# Patient Record
Sex: Female | Born: 1935 | Race: White | Hispanic: No | Smoking: Former smoker
Health system: Southern US, Community
[De-identification: ages and names within clinical notes are randomized; demographics above are authoritative.]

## PROBLEM LIST (undated history)

## (undated) DIAGNOSIS — E039 Hypothyroidism, unspecified: Secondary | ICD-10-CM

## (undated) DIAGNOSIS — K5730 Diverticulosis of large intestine without perforation or abscess without bleeding: Secondary | ICD-10-CM

## (undated) DIAGNOSIS — K5792 Diverticulitis of intestine, part unspecified, without perforation or abscess without bleeding: Secondary | ICD-10-CM

## (undated) DIAGNOSIS — M797 Fibromyalgia: Secondary | ICD-10-CM

## (undated) DIAGNOSIS — M1990 Unspecified osteoarthritis, unspecified site: Secondary | ICD-10-CM

## (undated) DIAGNOSIS — K5790 Diverticulosis of intestine, part unspecified, without perforation or abscess without bleeding: Secondary | ICD-10-CM

## (undated) HISTORY — PX: ABDOMINAL ADHESION SURGERY: SHX90

## (undated) HISTORY — PX: ECTOPIC PREGNANCY SURGERY: SHX613

## (undated) HISTORY — PX: ABDOMINAL HYSTERECTOMY: SHX81

## (undated) HISTORY — PX: KNEE ARTHROPLASTY: SHX992

---

## 2005-04-02 ENCOUNTER — Emergency Department (HOSPITAL_COMMUNITY): Admission: EM | Admit: 2005-04-02 | Discharge: 2005-04-02 | Payer: Self-pay | Admitting: Emergency Medicine

## 2005-12-31 ENCOUNTER — Encounter: Admission: RE | Admit: 2005-12-31 | Discharge: 2005-12-31 | Payer: Self-pay | Admitting: Gastroenterology

## 2006-03-12 ENCOUNTER — Ambulatory Visit (HOSPITAL_COMMUNITY): Admission: RE | Admit: 2006-03-12 | Discharge: 2006-03-12 | Payer: Self-pay | Admitting: Gastroenterology

## 2006-09-07 ENCOUNTER — Ambulatory Visit: Payer: Self-pay | Admitting: Oncology

## 2006-09-21 LAB — CBC & DIFF AND RETIC
Eosinophils Absolute: 0.1 10*3/uL (ref 0.0–0.5)
MONO#: 0.7 10*3/uL (ref 0.1–0.9)
MONO%: 12.6 % (ref 0.0–13.0)
NEUT#: 3.5 10*3/uL (ref 1.5–6.5)
RBC: 3.42 10*6/uL — ABNORMAL LOW (ref 3.70–5.32)
RDW: 14.7 % — ABNORMAL HIGH (ref 11.3–14.5)
RETIC #: 49.9 10*3/uL (ref 19.7–115.1)
Retic %: 1.5 % (ref 0.4–2.3)
WBC: 5.7 10*3/uL (ref 3.9–10.0)

## 2006-09-21 LAB — IRON AND TIBC

## 2006-09-21 LAB — COMPREHENSIVE METABOLIC PANEL
ALT: 12 U/L (ref 0–35)
AST: 27 U/L (ref 0–37)
Albumin: 4.2 g/dL (ref 3.5–5.2)
BUN: 17 mg/dL (ref 6–23)
Calcium: 9 mg/dL (ref 8.4–10.5)
Chloride: 95 mEq/L — ABNORMAL LOW (ref 96–112)
Potassium: 4 mEq/L (ref 3.5–5.3)

## 2006-09-21 LAB — MORPHOLOGY

## 2006-09-21 LAB — VITAMIN B12: Vitamin B-12: 380 pg/mL (ref 211–911)

## 2006-10-20 LAB — CBC & DIFF AND RETIC
Basophils Absolute: 0.1 10*3/uL (ref 0.0–0.1)
Eosinophils Absolute: 0.1 10*3/uL (ref 0.0–0.5)
HGB: 10.6 g/dL — ABNORMAL LOW (ref 11.6–15.9)
IRF: 0.15 (ref 0.130–0.330)
MONO#: 0.7 10*3/uL (ref 0.1–0.9)
NEUT#: 4.8 10*3/uL (ref 1.5–6.5)
RDW: 22.2 % — ABNORMAL HIGH (ref 11.3–14.5)
RETIC #: 45.8 10*3/uL (ref 19.7–115.1)
WBC: 6.7 10*3/uL (ref 3.9–10.0)
lymph#: 0.9 10*3/uL (ref 0.9–3.3)

## 2006-11-18 ENCOUNTER — Ambulatory Visit: Payer: Self-pay | Admitting: Oncology

## 2006-11-23 LAB — FERRITIN: Ferritin: 150 ng/mL (ref 10–291)

## 2006-11-23 LAB — CBC & DIFF AND RETIC
Basophils Absolute: 0 10*3/uL (ref 0.0–0.1)
EOS%: 1.2 % (ref 0.0–7.0)
Eosinophils Absolute: 0.1 10*3/uL (ref 0.0–0.5)
HGB: 11.9 g/dL (ref 11.6–15.9)
IRF: 0.25 (ref 0.130–0.330)
NEUT#: 5.6 10*3/uL (ref 1.5–6.5)
RBC: 4.24 10*6/uL (ref 3.70–5.32)
RDW: 29.8 % — ABNORMAL HIGH (ref 11.3–14.5)
RETIC #: 41.6 10*3/uL (ref 19.7–115.1)
Retic %: 1 % (ref 0.4–2.3)
lymph#: 0.9 10*3/uL (ref 0.9–3.3)

## 2006-11-23 LAB — IRON AND TIBC
TIBC: 296 ug/dL (ref 250–470)
UIBC: 217 ug/dL

## 2007-04-29 ENCOUNTER — Encounter: Admission: RE | Admit: 2007-04-29 | Discharge: 2007-07-28 | Payer: Self-pay | Admitting: Neurology

## 2007-05-12 ENCOUNTER — Ambulatory Visit: Payer: Self-pay | Admitting: Oncology

## 2007-05-17 LAB — CBC WITH DIFFERENTIAL/PLATELET
BASO%: 0.6 % (ref 0.0–2.0)
Basophils Absolute: 0 10*3/uL (ref 0.0–0.1)
EOS%: 0.9 % (ref 0.0–7.0)
Eosinophils Absolute: 0.1 10*3/uL (ref 0.0–0.5)
HCT: 35.7 % (ref 34.8–46.6)
HGB: 12.6 g/dL (ref 11.6–15.9)
LYMPH%: 20.2 % (ref 14.0–48.0)
MCH: 32.6 pg (ref 26.0–34.0)
MCHC: 35.2 g/dL (ref 32.0–36.0)
MCV: 92.7 fL (ref 81.0–101.0)
MONO#: 0.5 10*3/uL (ref 0.1–0.9)
MONO%: 8.1 % (ref 0.0–13.0)
NEUT#: 4.6 10*3/uL (ref 1.5–6.5)
NEUT%: 70.2 % (ref 39.6–76.8)
Platelets: 368 10*3/uL (ref 145–400)
RBC: 3.85 10*6/uL (ref 3.70–5.32)
RDW: 13.7 % (ref 11.3–14.5)
WBC: 6.6 10*3/uL (ref 3.9–10.0)
lymph#: 1.3 10*3/uL (ref 0.9–3.3)

## 2007-05-17 LAB — COMPREHENSIVE METABOLIC PANEL
ALT: 15 U/L (ref 0–35)
AST: 24 U/L (ref 0–37)
Albumin: 4.2 g/dL (ref 3.5–5.2)
Alkaline Phosphatase: 53 U/L (ref 39–117)
BUN: 14 mg/dL (ref 6–23)
CO2: 26 mEq/L (ref 19–32)
Calcium: 9.4 mg/dL (ref 8.4–10.5)
Chloride: 97 mEq/L (ref 96–112)
Creatinine, Ser: 0.62 mg/dL (ref 0.40–1.20)
Glucose, Bld: 99 mg/dL (ref 70–99)
Potassium: 4.1 mEq/L (ref 3.5–5.3)
Sodium: 135 mEq/L (ref 135–145)
Total Bilirubin: 0.6 mg/dL (ref 0.3–1.2)
Total Protein: 6.7 g/dL (ref 6.0–8.3)

## 2007-05-17 LAB — IRON AND TIBC
%SAT: 33 % (ref 20–55)
Iron: 101 ug/dL (ref 42–145)
TIBC: 309 ug/dL (ref 250–470)
UIBC: 208 ug/dL

## 2007-05-17 LAB — FERRITIN: Ferritin: 97 ng/mL (ref 10–291)

## 2007-11-03 ENCOUNTER — Inpatient Hospital Stay (HOSPITAL_COMMUNITY): Admission: EM | Admit: 2007-11-03 | Discharge: 2007-11-04 | Payer: Self-pay | Admitting: Emergency Medicine

## 2007-11-11 ENCOUNTER — Ambulatory Visit: Payer: Self-pay | Admitting: Oncology

## 2007-11-15 LAB — CBC WITH DIFFERENTIAL/PLATELET
Basophils Absolute: 0.1 10*3/uL (ref 0.0–0.1)
EOS%: 1.4 % (ref 0.0–7.0)
HCT: 29.6 % — ABNORMAL LOW (ref 34.8–46.6)
HGB: 10.1 g/dL — ABNORMAL LOW (ref 11.6–15.9)
LYMPH%: 17 % (ref 14.0–48.0)
MCH: 31.3 pg (ref 26.0–34.0)
MCV: 92.4 fL (ref 81.0–101.0)
MONO%: 9.5 % (ref 0.0–13.0)
NEUT%: 71.1 % (ref 39.6–76.8)
Platelets: 647 10*3/uL — ABNORMAL HIGH (ref 145–400)
RDW: 15.9 % — ABNORMAL HIGH (ref 11.3–14.5)

## 2007-11-15 LAB — COMPREHENSIVE METABOLIC PANEL
AST: 24 U/L (ref 0–37)
Alkaline Phosphatase: 49 U/L (ref 39–117)
BUN: 15 mg/dL (ref 6–23)
Creatinine, Ser: 0.69 mg/dL (ref 0.40–1.20)
Glucose, Bld: 105 mg/dL — ABNORMAL HIGH (ref 70–99)
Total Bilirubin: 0.4 mg/dL (ref 0.3–1.2)

## 2007-11-15 LAB — IRON AND TIBC
%SAT: 13 % — ABNORMAL LOW (ref 20–55)
TIBC: 355 ug/dL (ref 250–470)
UIBC: 308 ug/dL

## 2008-01-27 ENCOUNTER — Ambulatory Visit: Payer: Self-pay | Admitting: Oncology

## 2008-02-01 LAB — CBC & DIFF AND RETIC
Basophils Absolute: 0 10*3/uL (ref 0.0–0.1)
EOS%: 0.8 % (ref 0.0–7.0)
HGB: 12.6 g/dL (ref 11.6–15.9)
LYMPH%: 16.8 % (ref 14.0–48.0)
MCH: 30.2 pg (ref 26.0–34.0)
MCV: 90.5 fL (ref 81.0–101.0)
MONO%: 11.8 % (ref 0.0–13.0)
Platelets: 319 10*3/uL (ref 145–400)
RBC: 4.18 10*6/uL (ref 3.70–5.32)
RDW: 15.5 % — ABNORMAL HIGH (ref 11.3–14.5)
RETIC #: 39.7 10*3/uL (ref 19.7–115.1)

## 2008-02-01 LAB — FERRITIN: Ferritin: 274 ng/mL (ref 10–291)

## 2008-03-10 ENCOUNTER — Ambulatory Visit: Payer: Self-pay | Admitting: Oncology

## 2008-03-14 LAB — CBC & DIFF AND RETIC
BASO%: 0.2 % (ref 0.0–2.0)
Basophils Absolute: 0 10e3/uL (ref 0.0–0.1)
EOS%: 0.4 % (ref 0.0–7.0)
Eosinophils Absolute: 0 10e3/uL (ref 0.0–0.5)
HCT: 36.2 % (ref 34.8–46.6)
HGB: 12.2 g/dL (ref 11.6–15.9)
IRF: 0.32 (ref 0.130–0.330)
LYMPH%: 16.8 % (ref 14.0–48.0)
MCH: 31.5 pg (ref 26.0–34.0)
MCHC: 33.8 g/dL (ref 32.0–36.0)
MCV: 93.2 fL (ref 81.0–101.0)
MONO#: 0.5 10e3/uL (ref 0.1–0.9)
MONO%: 8.3 % (ref 0.0–13.0)
NEUT#: 4.8 10e3/uL (ref 1.5–6.5)
NEUT%: 74.3 % (ref 39.6–76.8)
Platelets: 421 10e3/uL — ABNORMAL HIGH (ref 145–400)
RBC: 3.88 10e6/uL (ref 3.70–5.32)
RDW: 16.4 % — ABNORMAL HIGH (ref 11.3–14.5)
RETIC #: 89.2 10e3/uL (ref 19.7–115.1)
Retic %: 2.3 % (ref 0.4–2.3)
WBC: 6.4 10e3/uL (ref 3.9–10.0)
lymph#: 1.1 10e3/uL (ref 0.9–3.3)

## 2008-03-14 LAB — IRON AND TIBC: %SAT: 29 % (ref 20–55)

## 2008-03-14 LAB — FOLATE: Folate: >20 ng/mL

## 2008-03-14 LAB — VITAMIN B12: Vitamin B-12: 414 pg/mL (ref 211–911)

## 2008-05-05 ENCOUNTER — Ambulatory Visit: Payer: Self-pay | Admitting: Oncology

## 2008-05-09 LAB — CBC & DIFF AND RETIC
BASO%: 0.6 % (ref 0.0–2.0)
Basophils Absolute: 0 10e3/uL (ref 0.0–0.1)
EOS%: 0.6 % (ref 0.0–7.0)
Eosinophils Absolute: 0 10e3/uL (ref 0.0–0.5)
HCT: 38.7 % (ref 34.8–46.6)
HGB: 13.1 g/dL (ref 11.6–15.9)
IRF: 0.34 — ABNORMAL HIGH (ref 0.130–0.330)
LYMPH%: 14 % (ref 14.0–48.0)
MCH: 32.9 pg (ref 26.0–34.0)
MCHC: 33.9 g/dL (ref 32.0–36.0)
MCV: 97.1 fL (ref 81.0–101.0)
MONO#: 0.9 10e3/uL (ref 0.1–0.9)
MONO%: 13.6 % — ABNORMAL HIGH (ref 0.0–13.0)
NEUT#: 4.5 10e3/uL (ref 1.5–6.5)
NEUT%: 71.2 % (ref 39.6–76.8)
Platelets: 412 10e3/uL — ABNORMAL HIGH (ref 145–400)
RBC: 3.98 10e6/uL (ref 3.70–5.32)
RDW: 13.9 % (ref 11.3–14.5)
RETIC #: 44.2 10e3/uL (ref 19.7–115.1)
Retic %: 1.1 % (ref 0.4–2.3)
WBC: 6.3 10e3/uL (ref 3.9–10.0)
lymph#: 0.9 10e3/uL (ref 0.9–3.3)

## 2008-05-09 LAB — FERRITIN: Ferritin: 133 ng/mL (ref 10–291)

## 2008-05-09 LAB — IRON AND TIBC
%SAT: 20 % (ref 20–55)
Iron: 59 ug/dL (ref 42–145)
TIBC: 288 ug/dL (ref 250–470)
UIBC: 229 ug/dL

## 2008-06-25 ENCOUNTER — Inpatient Hospital Stay (HOSPITAL_COMMUNITY): Admission: EM | Admit: 2008-06-25 | Discharge: 2008-06-27 | Payer: Self-pay | Admitting: Emergency Medicine

## 2008-06-28 ENCOUNTER — Ambulatory Visit: Payer: Self-pay | Admitting: Oncology

## 2008-06-30 LAB — CBC & DIFF AND RETIC
BASO%: 0.4 % (ref 0.0–2.0)
IRF: 0.49 — ABNORMAL HIGH (ref 0.130–0.330)
MCHC: 33.8 g/dL (ref 32.0–36.0)
MONO#: 0.9 10*3/uL (ref 0.1–0.9)
RBC: 3.36 10*6/uL — ABNORMAL LOW (ref 3.70–5.32)
RDW: 16.6 % — ABNORMAL HIGH (ref 11.3–14.5)
Retic %: 4.4 % — ABNORMAL HIGH (ref 0.4–2.3)
WBC: 6.4 10*3/uL (ref 3.9–10.0)
lymph#: 1 10*3/uL (ref 0.9–3.3)

## 2008-06-30 LAB — COMPREHENSIVE METABOLIC PANEL
ALT: 20 U/L (ref 0–35)
AST: 27 U/L (ref 0–37)
BUN: 17 mg/dL (ref 6–23)
Creatinine, Ser: 0.52 mg/dL (ref 0.40–1.20)
Total Bilirubin: 0.3 mg/dL (ref 0.3–1.2)

## 2008-06-30 LAB — LACTATE DEHYDROGENASE: LDH: 201 U/L (ref 94–250)

## 2008-06-30 LAB — IRON AND TIBC
Iron: 21 ug/dL — ABNORMAL LOW (ref 42–145)
UIBC: 294 ug/dL

## 2008-06-30 LAB — FERRITIN: Ferritin: 20 ng/mL (ref 10–291)

## 2008-08-30 ENCOUNTER — Ambulatory Visit: Payer: Self-pay | Admitting: Oncology

## 2008-10-24 ENCOUNTER — Ambulatory Visit: Payer: Self-pay | Admitting: Oncology

## 2008-10-27 LAB — CBC WITH DIFFERENTIAL/PLATELET
Basophils Absolute: 0 10*3/uL (ref 0.0–0.1)
Eosinophils Absolute: 0.1 10*3/uL (ref 0.0–0.5)
HGB: 12.3 g/dL (ref 11.6–15.9)
MCV: 93.4 fL (ref 79.5–101.0)
MONO%: 9 % (ref 0.0–14.0)
NEUT#: 5 10*3/uL (ref 1.5–6.5)
RDW: 14.7 % — ABNORMAL HIGH (ref 11.2–14.5)
lymph#: 1.3 10*3/uL (ref 0.9–3.3)

## 2008-10-27 LAB — IRON AND TIBC
%SAT: 28 % (ref 20–55)
TIBC: 288 ug/dL (ref 250–470)
UIBC: 207 ug/dL

## 2008-10-27 LAB — FERRITIN: Ferritin: 204 ng/mL (ref 10–291)

## 2009-01-03 ENCOUNTER — Ambulatory Visit: Payer: Self-pay | Admitting: Oncology

## 2009-01-05 LAB — IRON AND TIBC: Iron: 97 ug/dL (ref 42–145)

## 2009-01-05 LAB — CBC WITH DIFFERENTIAL/PLATELET
BASO%: 0.8 % (ref 0.0–2.0)
LYMPH%: 12.3 % — ABNORMAL LOW (ref 14.0–49.7)
MCHC: 34.7 g/dL (ref 31.5–36.0)
MCV: 94.3 fL (ref 79.5–101.0)
MONO%: 9.3 % (ref 0.0–14.0)
Platelets: 383 10*3/uL (ref 145–400)
RBC: 3.74 10*6/uL (ref 3.70–5.45)

## 2009-01-05 LAB — FERRITIN: Ferritin: 191 ng/mL (ref 10–291)

## 2009-01-25 ENCOUNTER — Encounter (HOSPITAL_COMMUNITY): Admission: RE | Admit: 2009-01-25 | Discharge: 2009-04-25 | Payer: Self-pay | Admitting: Oncology

## 2009-01-25 ENCOUNTER — Emergency Department (HOSPITAL_COMMUNITY): Admission: EM | Admit: 2009-01-25 | Discharge: 2009-01-25 | Payer: Self-pay | Admitting: Emergency Medicine

## 2009-01-25 LAB — CBC WITH DIFFERENTIAL/PLATELET
Basophils Absolute: 0 10*3/uL (ref 0.0–0.1)
Eosinophils Absolute: 0.1 10*3/uL (ref 0.0–0.5)
HGB: 8.2 g/dL — ABNORMAL LOW (ref 11.6–15.9)
MONO#: 0.8 10*3/uL (ref 0.1–0.9)
NEUT#: 4.9 10*3/uL (ref 1.5–6.5)
RDW: 15.9 % — ABNORMAL HIGH (ref 11.2–14.5)
WBC: 7 10*3/uL (ref 3.9–10.3)
lymph#: 1.3 10*3/uL (ref 0.9–3.3)

## 2009-02-24 ENCOUNTER — Inpatient Hospital Stay (HOSPITAL_COMMUNITY): Admission: EM | Admit: 2009-02-24 | Discharge: 2009-02-25 | Payer: Self-pay | Admitting: Emergency Medicine

## 2009-02-27 ENCOUNTER — Ambulatory Visit: Payer: Self-pay | Admitting: Oncology

## 2009-02-27 LAB — CBC WITH DIFFERENTIAL/PLATELET
BASO%: 0.7 % (ref 0.0–2.0)
Eosinophils Absolute: 0.1 10*3/uL (ref 0.0–0.5)
HCT: 29.5 % — ABNORMAL LOW (ref 34.8–46.6)
LYMPH%: 15.6 % (ref 14.0–49.7)
MCHC: 32.2 g/dL (ref 31.5–36.0)
MONO#: 0.7 10*3/uL (ref 0.1–0.9)
NEUT%: 72.7 % (ref 38.4–76.8)
Platelets: 303 10*3/uL (ref 145–400)
WBC: 7.1 10*3/uL (ref 3.9–10.3)

## 2009-02-27 LAB — FERRITIN: Ferritin: 76 ng/mL (ref 10–291)

## 2009-02-27 LAB — IRON AND TIBC: %SAT: 9 % — ABNORMAL LOW (ref 20–55)

## 2009-03-21 ENCOUNTER — Inpatient Hospital Stay (HOSPITAL_COMMUNITY): Admission: EM | Admit: 2009-03-21 | Discharge: 2009-03-24 | Payer: Self-pay | Admitting: Emergency Medicine

## 2009-04-01 ENCOUNTER — Inpatient Hospital Stay (HOSPITAL_COMMUNITY): Admission: EM | Admit: 2009-04-01 | Discharge: 2009-04-03 | Payer: Self-pay | Admitting: Emergency Medicine

## 2009-04-16 ENCOUNTER — Ambulatory Visit: Payer: Self-pay | Admitting: Oncology

## 2009-04-16 LAB — CBC WITH DIFFERENTIAL/PLATELET
Basophils Absolute: 0.1 10*3/uL (ref 0.0–0.1)
EOS%: 0.9 % (ref 0.0–7.0)
MCH: 31.2 pg (ref 25.1–34.0)
MCHC: 33.4 g/dL (ref 31.5–36.0)
MCV: 93.4 fL (ref 79.5–101.0)
MONO%: 8.1 % (ref 0.0–14.0)
RBC: 3.12 10*6/uL — ABNORMAL LOW (ref 3.70–5.45)
RDW: 15.5 % — ABNORMAL HIGH (ref 11.2–14.5)

## 2009-04-16 LAB — IRON AND TIBC: UIBC: 244 ug/dL

## 2009-04-25 ENCOUNTER — Encounter (INDEPENDENT_AMBULATORY_CARE_PROVIDER_SITE_OTHER): Payer: Self-pay | Admitting: Orthopedic Surgery

## 2009-04-25 ENCOUNTER — Inpatient Hospital Stay (HOSPITAL_COMMUNITY): Admission: RE | Admit: 2009-04-25 | Discharge: 2009-04-27 | Payer: Self-pay | Admitting: Orthopedic Surgery

## 2009-06-19 ENCOUNTER — Ambulatory Visit: Payer: Self-pay | Admitting: Oncology

## 2009-06-25 LAB — CBC WITH DIFFERENTIAL/PLATELET
BASO%: 0.6 % (ref 0.0–2.0)
MCHC: 33 g/dL (ref 31.5–36.0)
MONO#: 1 10*3/uL — ABNORMAL HIGH (ref 0.1–0.9)
NEUT#: 5.3 10*3/uL (ref 1.5–6.5)
RBC: 3.74 10*6/uL (ref 3.70–5.45)
WBC: 7.6 10*3/uL (ref 3.9–10.3)
lymph#: 1.1 10*3/uL (ref 0.9–3.3)

## 2009-06-25 LAB — IRON AND TIBC
%SAT: 8 % — ABNORMAL LOW (ref 20–55)
Iron: 29 ug/dL — ABNORMAL LOW (ref 42–145)
TIBC: 372 ug/dL (ref 250–470)
UIBC: 343 ug/dL

## 2009-07-09 LAB — CBC WITH DIFFERENTIAL/PLATELET
Basophils Absolute: 0 10*3/uL (ref 0.0–0.1)
EOS%: 0.9 % (ref 0.0–7.0)
HCT: 35.8 % (ref 34.8–46.6)
HGB: 11.7 g/dL (ref 11.6–15.9)
LYMPH%: 15.1 % (ref 14.0–49.7)
MCH: 28.8 pg (ref 25.1–34.0)
MONO#: 0.7 10*3/uL (ref 0.1–0.9)
NEUT%: 74.7 % (ref 38.4–76.8)
Platelets: 502 10*3/uL — ABNORMAL HIGH (ref 145–400)
lymph#: 1.2 10*3/uL (ref 0.9–3.3)

## 2009-07-09 LAB — IRON AND TIBC
%SAT: 6 % — ABNORMAL LOW (ref 20–55)
TIBC: 395 ug/dL (ref 250–470)

## 2009-07-09 LAB — VITAMIN B12: Vitamin B-12: 380 pg/mL (ref 211–911)

## 2009-07-19 ENCOUNTER — Ambulatory Visit: Payer: Self-pay | Admitting: Oncology

## 2009-07-28 HISTORY — PX: COLONOSCOPY: SHX174

## 2009-08-17 ENCOUNTER — Ambulatory Visit: Payer: Self-pay | Admitting: Oncology

## 2009-09-10 LAB — CBC WITH DIFFERENTIAL/PLATELET
BASO%: 0.4 % (ref 0.0–2.0)
Basophils Absolute: 0 10*3/uL (ref 0.0–0.1)
EOS%: 0.8 % (ref 0.0–7.0)
Eosinophils Absolute: 0.1 10*3/uL (ref 0.0–0.5)
HCT: 31.5 % — ABNORMAL LOW (ref 34.8–46.6)
HGB: 10.4 g/dL — ABNORMAL LOW (ref 11.6–15.9)
LYMPH%: 12.5 % — ABNORMAL LOW (ref 14.0–49.7)
MCH: 30.9 pg (ref 25.1–34.0)
MCHC: 33.1 g/dL (ref 31.5–36.0)
MCV: 93.2 fL (ref 79.5–101.0)
MONO#: 0.9 10*3/uL (ref 0.1–0.9)
MONO%: 10.2 % (ref 0.0–14.0)
NEUT#: 6.7 10*3/uL — ABNORMAL HIGH (ref 1.5–6.5)
NEUT%: 76.1 % (ref 38.4–76.8)
Platelets: 539 10*3/uL — ABNORMAL HIGH (ref 145–400)
RBC: 3.38 10*6/uL — ABNORMAL LOW (ref 3.70–5.45)
RDW: 18.6 % — ABNORMAL HIGH (ref 11.2–14.5)
WBC: 8.8 10*3/uL (ref 3.9–10.3)
lymph#: 1.1 10*3/uL (ref 0.9–3.3)

## 2009-09-10 LAB — FERRITIN: Ferritin: 102 ng/mL (ref 10–291)

## 2009-09-25 ENCOUNTER — Inpatient Hospital Stay (HOSPITAL_COMMUNITY): Admission: EM | Admit: 2009-09-25 | Discharge: 2009-09-28 | Payer: Self-pay | Admitting: Emergency Medicine

## 2009-11-06 ENCOUNTER — Ambulatory Visit: Payer: Self-pay | Admitting: Oncology

## 2009-11-08 LAB — CBC WITH DIFFERENTIAL/PLATELET
BASO%: 0.3 % (ref 0.0–2.0)
Basophils Absolute: 0 10*3/uL (ref 0.0–0.1)
EOS%: 0.5 % (ref 0.0–7.0)
Eosinophils Absolute: 0 10*3/uL (ref 0.0–0.5)
HCT: 33.9 % — ABNORMAL LOW (ref 34.8–46.6)
HGB: 11 g/dL — ABNORMAL LOW (ref 11.6–15.9)
LYMPH%: 8.9 % — ABNORMAL LOW (ref 14.0–49.7)
MCH: 29 pg (ref 25.1–34.0)
MCHC: 32.4 g/dL (ref 31.5–36.0)
MCV: 89.3 fL (ref 79.5–101.0)
MONO#: 0.9 10*3/uL (ref 0.1–0.9)
MONO%: 8.6 % (ref 0.0–14.0)
NEUT#: 8.5 10*3/uL — ABNORMAL HIGH (ref 1.5–6.5)
NEUT%: 81.7 % — ABNORMAL HIGH (ref 38.4–76.8)
Platelets: 643 10*3/uL — ABNORMAL HIGH (ref 145–400)
RBC: 3.8 10*6/uL (ref 3.70–5.45)
RDW: 14 % (ref 11.2–14.5)
WBC: 10.4 10*3/uL — ABNORMAL HIGH (ref 3.9–10.3)
lymph#: 0.9 10*3/uL (ref 0.9–3.3)

## 2009-11-08 LAB — FERRITIN: Ferritin: 16 ng/mL (ref 10–291)

## 2009-12-19 ENCOUNTER — Emergency Department (HOSPITAL_COMMUNITY): Admission: EM | Admit: 2009-12-19 | Discharge: 2009-12-19 | Payer: Self-pay | Admitting: Emergency Medicine

## 2010-01-04 ENCOUNTER — Ambulatory Visit: Payer: Self-pay | Admitting: Oncology

## 2010-01-09 LAB — CBC WITH DIFFERENTIAL/PLATELET
BASO%: 0.6 % (ref 0.0–2.0)
Basophils Absolute: 0.1 10*3/uL (ref 0.0–0.1)
EOS%: 1.3 % (ref 0.0–7.0)
Eosinophils Absolute: 0.1 10*3/uL (ref 0.0–0.5)
HCT: 23.7 % — ABNORMAL LOW (ref 34.8–46.6)
HGB: 7.3 g/dL — ABNORMAL LOW (ref 11.6–15.9)
LYMPH%: 13.4 % — ABNORMAL LOW (ref 14.0–49.7)
MCH: 25.3 pg (ref 25.1–34.0)
MCHC: 30.8 g/dL — ABNORMAL LOW (ref 31.5–36.0)
MCV: 82.3 fL (ref 79.5–101.0)
MONO#: 1 10*3/uL — ABNORMAL HIGH (ref 0.1–0.9)
MONO%: 11.6 % (ref 0.0–14.0)
NEUT#: 6.1 10*3/uL (ref 1.5–6.5)
NEUT%: 73.1 % (ref 38.4–76.8)
Platelets: 820 10*3/uL — ABNORMAL HIGH (ref 145–400)
RBC: 2.88 10*6/uL — ABNORMAL LOW (ref 3.70–5.45)
RDW: 16 % — ABNORMAL HIGH (ref 11.2–14.5)
WBC: 8.3 10*3/uL (ref 3.9–10.3)
lymph#: 1.1 10*3/uL (ref 0.9–3.3)

## 2010-01-09 LAB — FERRITIN: Ferritin: 4 ng/mL — ABNORMAL LOW (ref 10–291)

## 2010-01-11 ENCOUNTER — Encounter (HOSPITAL_COMMUNITY): Admission: RE | Admit: 2010-01-11 | Discharge: 2010-01-11 | Payer: Self-pay | Admitting: Oncology

## 2010-01-11 LAB — TYPE & CROSSMATCH - CHCC

## 2010-03-06 ENCOUNTER — Ambulatory Visit: Payer: Self-pay | Admitting: Oncology

## 2010-03-08 LAB — CBC WITH DIFFERENTIAL/PLATELET
BASO%: 0.5 % (ref 0.0–2.0)
Basophils Absolute: 0 10*3/uL (ref 0.0–0.1)
EOS%: 1.5 % (ref 0.0–7.0)
Eosinophils Absolute: 0.1 10*3/uL (ref 0.0–0.5)
HCT: 38.4 % (ref 34.8–46.6)
HGB: 12.8 g/dL (ref 11.6–15.9)
LYMPH%: 15 % (ref 14.0–49.7)
MCH: 29.6 pg (ref 25.1–34.0)
MCHC: 33.2 g/dL (ref 31.5–36.0)
MCV: 89.1 fL (ref 79.5–101.0)
MONO#: 0.8 10*3/uL (ref 0.1–0.9)
MONO%: 12.1 % (ref 0.0–14.0)
NEUT#: 4.7 10*3/uL (ref 1.5–6.5)
NEUT%: 70.9 % (ref 38.4–76.8)
Platelets: 427 10*3/uL — ABNORMAL HIGH (ref 145–400)
RBC: 4.31 10*6/uL (ref 3.70–5.45)
RDW: 24 % — ABNORMAL HIGH (ref 11.2–14.5)
WBC: 6.7 10*3/uL (ref 3.9–10.3)
lymph#: 1 10*3/uL (ref 0.9–3.3)

## 2010-03-08 LAB — FERRITIN: Ferritin: 289 ng/mL (ref 10–291)

## 2010-05-08 ENCOUNTER — Ambulatory Visit: Payer: Self-pay | Admitting: Oncology

## 2010-05-10 LAB — CBC WITH DIFFERENTIAL/PLATELET
BASO%: 0.9 % (ref 0.0–2.0)
Basophils Absolute: 0.1 10*3/uL (ref 0.0–0.1)
EOS%: 0.6 % (ref 0.0–7.0)
Eosinophils Absolute: 0 10*3/uL (ref 0.0–0.5)
HCT: 37.3 % (ref 34.8–46.6)
HGB: 12.7 g/dL (ref 11.6–15.9)
LYMPH%: 10.4 % — ABNORMAL LOW (ref 14.0–49.7)
MCH: 32.1 pg (ref 25.1–34.0)
MCHC: 34.1 g/dL (ref 31.5–36.0)
MCV: 94.1 fL (ref 79.5–101.0)
MONO#: 0.7 10*3/uL (ref 0.1–0.9)
MONO%: 9.4 % (ref 0.0–14.0)
NEUT#: 6.2 10*3/uL (ref 1.5–6.5)
NEUT%: 78.7 % — ABNORMAL HIGH (ref 38.4–76.8)
Platelets: 357 10*3/uL (ref 145–400)
RBC: 3.96 10*6/uL (ref 3.70–5.45)
RDW: 13.6 % (ref 11.2–14.5)
WBC: 7.9 10*3/uL (ref 3.9–10.3)
lymph#: 0.8 10*3/uL — ABNORMAL LOW (ref 0.9–3.3)

## 2010-05-10 LAB — FERRITIN: Ferritin: 243 ng/mL (ref 10–291)

## 2010-06-02 IMAGING — CT CT PELVIS W/ CM
2 of 6 series · 17 of 46 positions shown, 19 images · IV contrast (agent unspecified)
Comparison: CT abdomen 12/31/2005.

CT ABDOMEN

CLINICAL DATA: Abdominal pain, GI bleeding.  Elevated white blood
cell count.

CT ABDOMEN AND PELVIS WITH CONTRAST
TECHNIQUE: Multidetector CT imaging of the abdomen and pelvis was
performed using the standard protocol following bolus
administration of intravenous contrast.
Contrast: 100 ml Fmnipaque-CGG.

[Series 2: rtn a/p with · axial · 0.64mm/px · z∈[+898,+1228]mm · 14 of 76 slices shown, 16 images]
[im 5/76  soft-tissue]
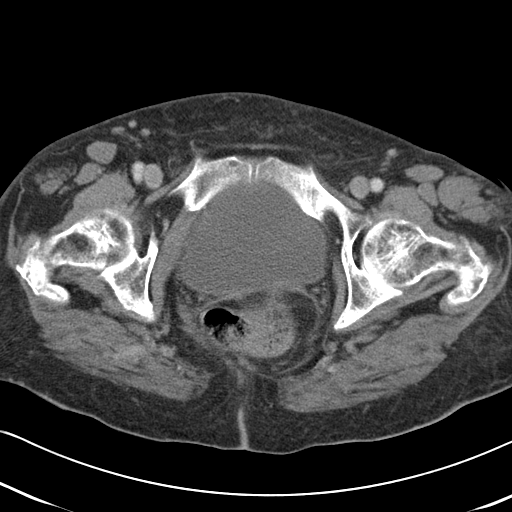
[im 5/76  bone]
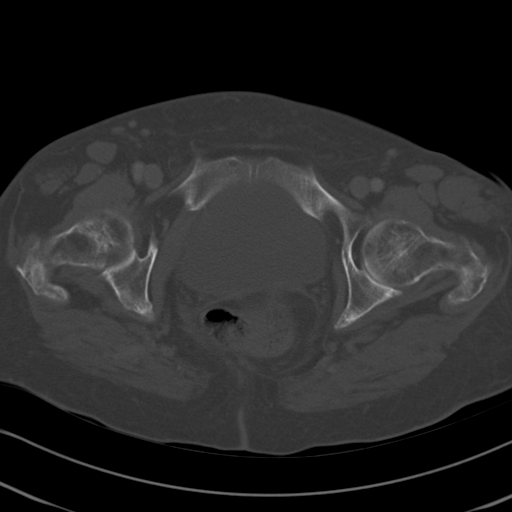
[im 10/76  soft-tissue]
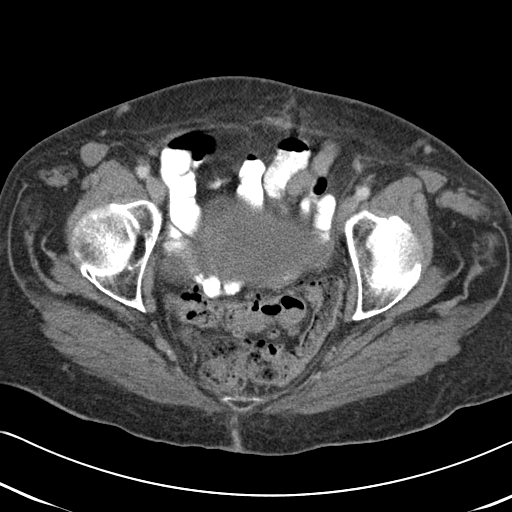
[im 15/76  soft-tissue]
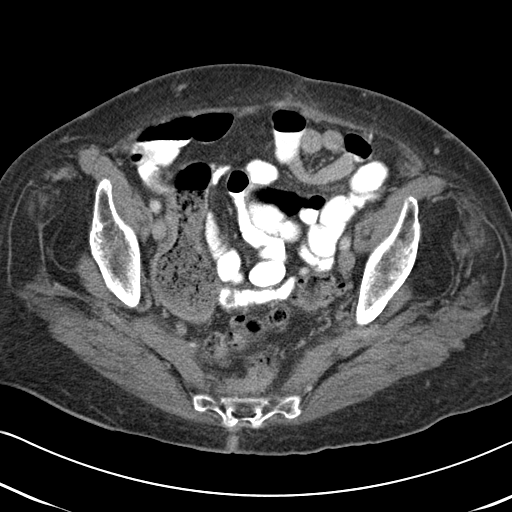
[im 19/76  soft-tissue]
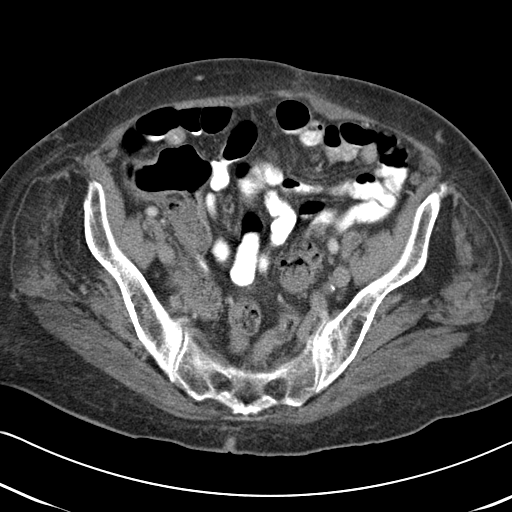
[im 24/76  soft-tissue]
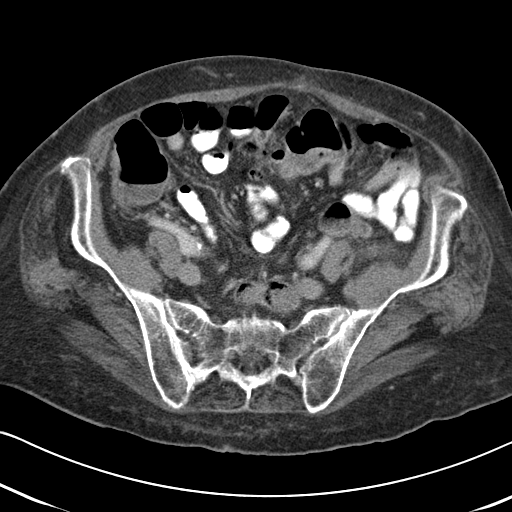
[im 29/76  soft-tissue]
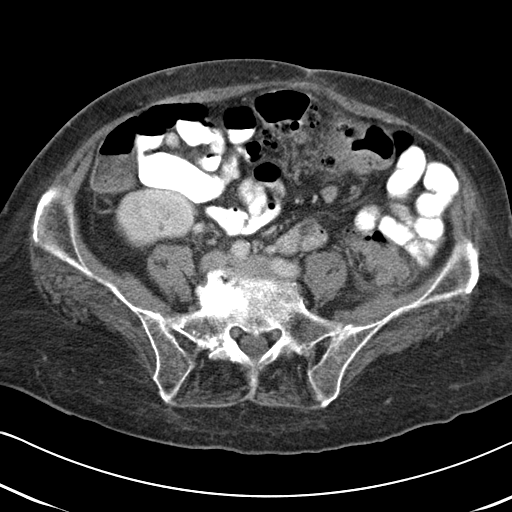
[im 33/76  soft-tissue]
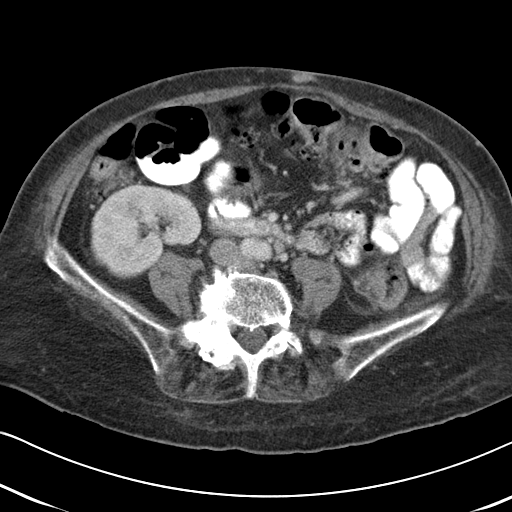
[im 43/76  soft-tissue]
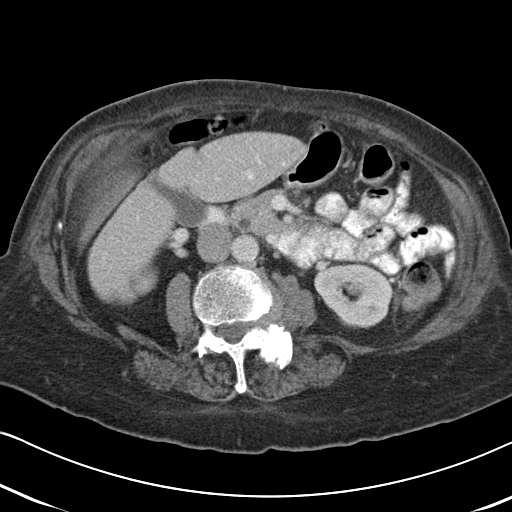
[im 47/76  soft-tissue]
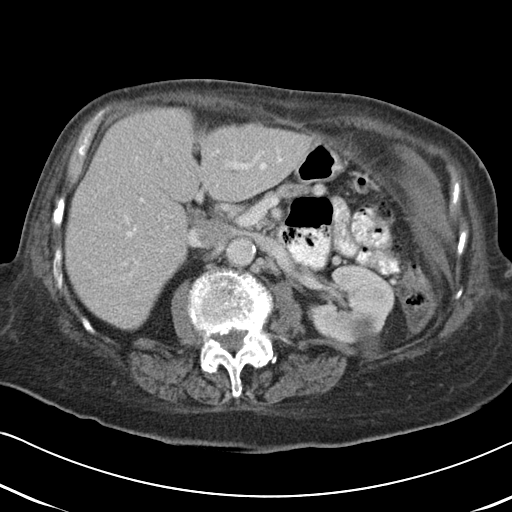
[im 47/76  bone]
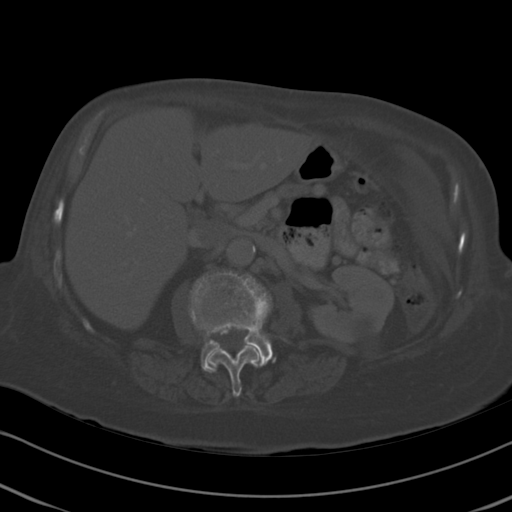
[im 52/76  soft-tissue]
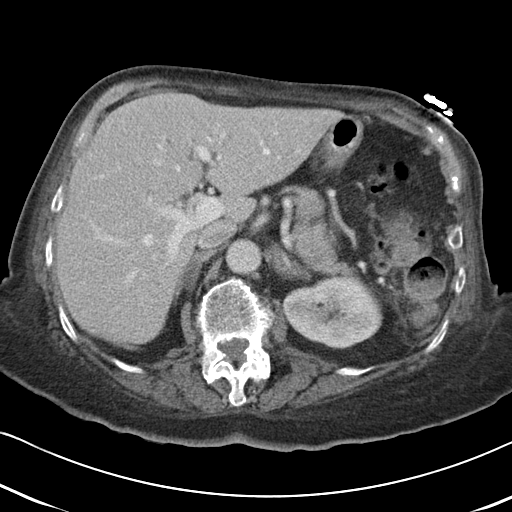
[im 57/76  soft-tissue]
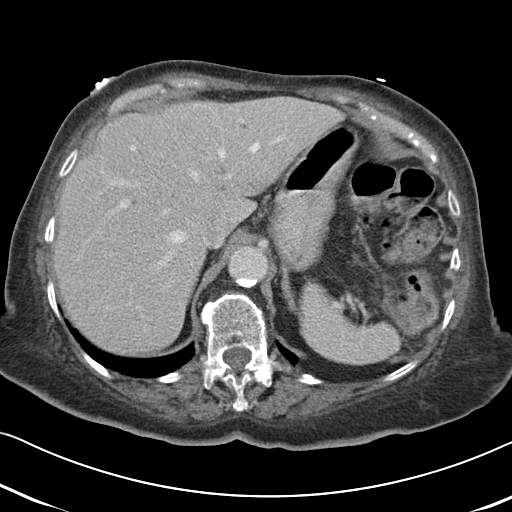
[im 61/76  soft-tissue]
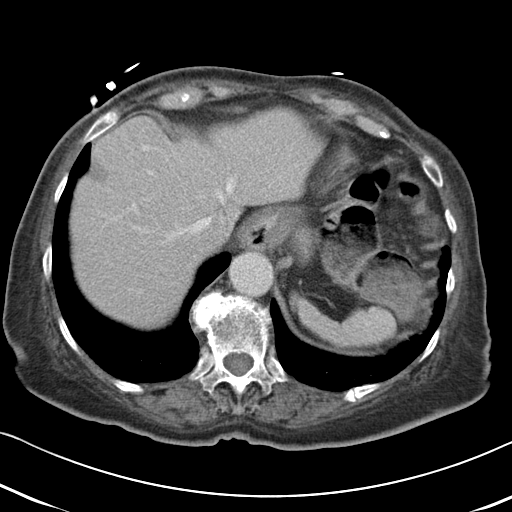
[im 66/76  soft-tissue]
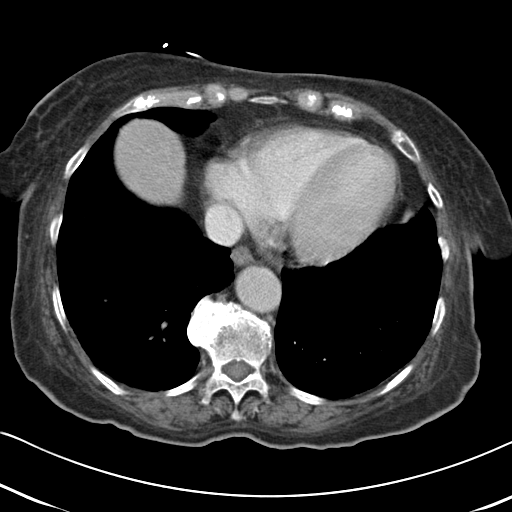
[im 71/76  soft-tissue]
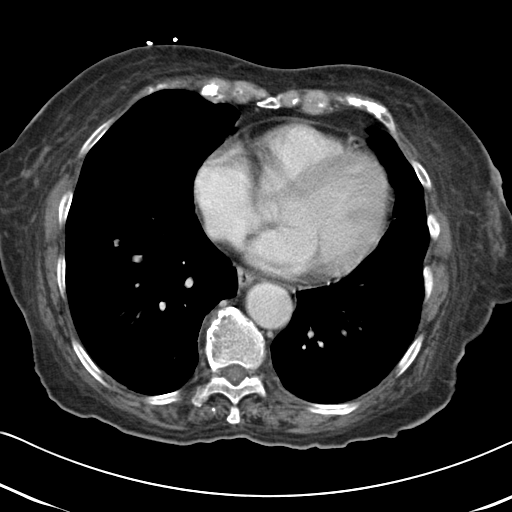

[Series 602: <mpr thick range> · coronal · 0.74mm/px · 3 of 74 slices shown]
[im 25/74  soft-tissue]
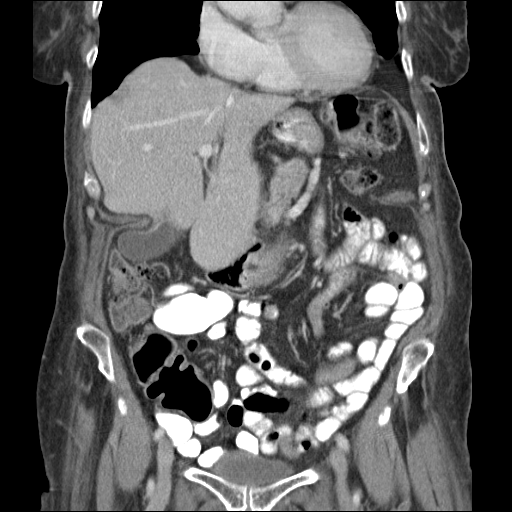
[im 33/74  soft-tissue]
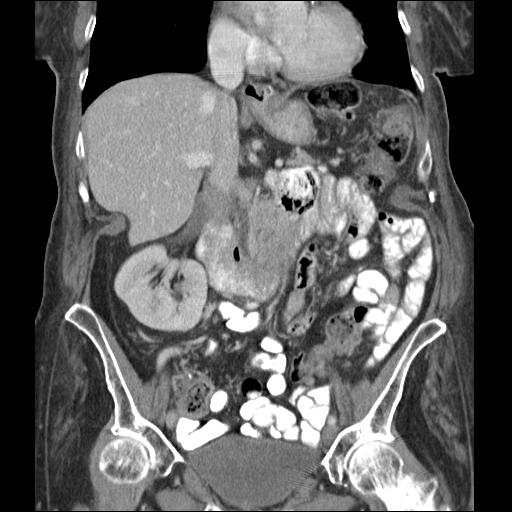
[im 41/74  soft-tissue]
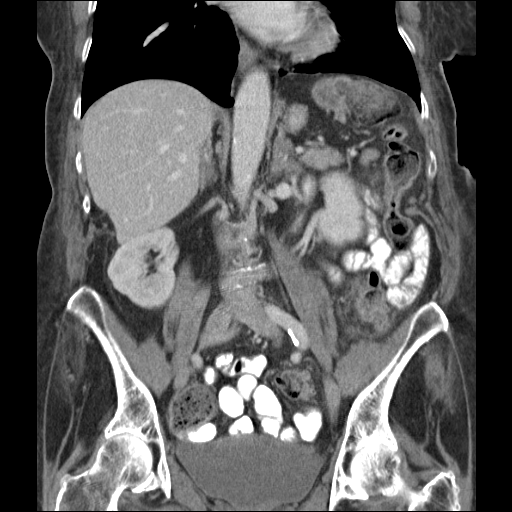

[17 of 46 positions shown; findings below may reference images not displayed]

FINDINGS: There is some mild dependent atelectatic change.  The
lung bases are otherwise clear.  The patient has a small hiatal
hernia.  There is no pleural or pericardial effusion.

The gallbladder, liver, biliary tree, spleen and pancreas are
unremarkable.  Small, low-attenuation bilateral adrenal nodules are
unchanged and consistent with benign adenomas.  Left renal cyst is
noted.  Also noted is a very small right renal cyst.  The kidneys
are otherwise unremarkable.  No abdominal lymphadenopathy or fluid.
Small bowel is unremarkable.  The patient has multilevel lumbar
spondylosis.  Remote left eleventh and twelfth rib fractures noted.
IMPRESSION: 1.  No acute finding.
2.  Small hiatal hernia.
3.  Bilateral adrenal adenomas, unchanged.
4.  Bilateral renal cysts.

CT PELVIS
FINDINGS: Note is made that a loop of colon extends into the left
ischiorectal fossa compatible with presence of rectocele.  The
patient has diverticular disease of the colon. Stranding about the
colon at the junction of the sigmoid and descending colon is
compatible with diverticulitis.  There is no abscess or
perforation.  The colon is otherwise unremarkable.  The appendix is
not visualized but no evidence inflammatory change in the right
lower quadrant is seen.  Postoperative change of hysterectomy
noted.  No focal bony abnormality.
IMPRESSION: 1. Diverticulosis with diverticulitis at the junction of the
descending and sigmoid colon.  No abscess or perforation.
2.  Rectocele.

## 2010-07-05 ENCOUNTER — Ambulatory Visit: Payer: Self-pay | Admitting: Oncology

## 2010-08-02 ENCOUNTER — Inpatient Hospital Stay (HOSPITAL_COMMUNITY)
Admission: EM | Admit: 2010-08-02 | Discharge: 2010-08-07 | Payer: Self-pay | Source: Home / Self Care | Attending: Internal Medicine | Admitting: Internal Medicine

## 2010-08-12 LAB — BASIC METABOLIC PANEL
BUN: 19 mg/dL (ref 6–23)
BUN: 10 mg/dL (ref 6–23)
BUN: 12 mg/dL (ref 6–23)
BUN: 14 mg/dL (ref 6–23)
BUN: 10 mg/dL (ref 6–23)
CO2: 26 mEq/L (ref 19–32)
CO2: 28 mEq/L (ref 19–32)
CO2: 25 mEq/L (ref 19–32)
CO2: 27 mEq/L (ref 19–32)
CO2: 29 mEq/L (ref 19–32)
Calcium: 8.3 mg/dL — ABNORMAL LOW (ref 8.4–10.5)
Calcium: 8.3 mg/dL — ABNORMAL LOW (ref 8.4–10.5)
Calcium: 8.1 mg/dL — ABNORMAL LOW (ref 8.4–10.5)
Calcium: 8.3 mg/dL — ABNORMAL LOW (ref 8.4–10.5)
Calcium: 8.7 mg/dL (ref 8.4–10.5)
Chloride: 104 mEq/L (ref 96–112)
Chloride: 105 mEq/L (ref 96–112)
Chloride: 105 mEq/L (ref 96–112)
Chloride: 105 mEq/L (ref 96–112)
Chloride: 101 mEq/L (ref 96–112)
Creatinine, Ser: 0.57 mg/dL (ref 0.4–1.2)
Creatinine, Ser: 0.51 mg/dL (ref 0.4–1.2)
Creatinine, Ser: 0.56 mg/dL (ref 0.4–1.2)
Creatinine, Ser: 0.47 mg/dL (ref 0.4–1.2)
Creatinine, Ser: 0.49 mg/dL (ref 0.4–1.2)
GFR calc Af Amer: >60 mL/min (ref >60)
GFR calc Af Amer: >60 mL/min (ref >60)
GFR calc Af Amer: >60 mL/min (ref >60)
GFR calc Af Amer: >60 mL/min (ref >60)
GFR calc Af Amer: >60 mL/min (ref >60)
GFR calc non Af Amer: >60 mL/min (ref >60)
GFR calc non Af Amer: >60 mL/min (ref >60)
GFR calc non Af Amer: >60 mL/min (ref >60)
GFR calc non Af Amer: >60 mL/min (ref >60)
GFR calc non Af Amer: >60 mL/min (ref >60)
Glucose, Bld: 101 mg/dL — ABNORMAL HIGH (ref 70–99)
Glucose, Bld: 95 mg/dL (ref 70–99)
Glucose, Bld: 89 mg/dL (ref 70–99)
Glucose, Bld: 95 mg/dL (ref 70–99)
Glucose, Bld: 95 mg/dL (ref 70–99)
Potassium: 3.4 mEq/L — ABNORMAL LOW (ref 3.5–5.1)
Potassium: 3.3 mEq/L — ABNORMAL LOW (ref 3.5–5.1)
Potassium: 3.5 mEq/L (ref 3.5–5.1)
Potassium: 3.5 mEq/L (ref 3.5–5.1)
Potassium: 3.4 mEq/L — ABNORMAL LOW (ref 3.5–5.1)
Sodium: 136 mEq/L (ref 135–145)
Sodium: 139 mEq/L (ref 135–145)
Sodium: 135 mEq/L (ref 135–145)
Sodium: 138 mEq/L (ref 135–145)
Sodium: 138 mEq/L (ref 135–145)

## 2010-08-12 LAB — TSH: TSH: 1.748 u[IU]/mL (ref 0.350–4.500)

## 2010-08-12 LAB — APTT
aPTT: 32 seconds (ref 24–37)
aPTT: 32 seconds (ref 24–37)

## 2010-08-12 LAB — CROSSMATCH
ABO/RH(D): AB POS
Antibody Screen: NEGATIVE
Unit division: 0
Unit division: 0
Unit division: 0
Unit division: 0
Unit division: 0
Unit division: 0

## 2010-08-12 LAB — URINE CULTURE
Colony Count: >=100000
Culture  Setup Time: 201201070432

## 2010-08-12 LAB — LIPASE, BLOOD: Lipase: 26 U/L (ref 11–59)

## 2010-08-12 LAB — HEMOGLOBIN AND HEMATOCRIT, BLOOD
HCT: 24.7 % — ABNORMAL LOW (ref 36.0–46.0)
HCT: 29.8 % — ABNORMAL LOW (ref 36.0–46.0)
HCT: 27.6 % — ABNORMAL LOW (ref 36.0–46.0)
HCT: 21.5 % — ABNORMAL LOW (ref 36.0–46.0)
HCT: 33.8 % — ABNORMAL LOW (ref 36.0–46.0)
HCT: 31.1 % — ABNORMAL LOW (ref 36.0–46.0)
HCT: 28 % — ABNORMAL LOW (ref 36.0–46.0)
HCT: 33.4 % — ABNORMAL LOW (ref 36.0–46.0)
HCT: 32.1 % — ABNORMAL LOW (ref 36.0–46.0)
Hemoglobin: 8.2 g/dL — ABNORMAL LOW (ref 12.0–15.0)
Hemoglobin: 10 g/dL — ABNORMAL LOW (ref 12.0–15.0)
Hemoglobin: 9.2 g/dL — ABNORMAL LOW (ref 12.0–15.0)
Hemoglobin: 7.1 g/dL — ABNORMAL LOW (ref 12.0–15.0)
Hemoglobin: 11.3 g/dL — ABNORMAL LOW (ref 12.0–15.0)
Hemoglobin: 10.4 g/dL — ABNORMAL LOW (ref 12.0–15.0)
Hemoglobin: 9.2 g/dL — ABNORMAL LOW (ref 12.0–15.0)
Hemoglobin: 10.9 g/dL — ABNORMAL LOW (ref 12.0–15.0)
Hemoglobin: 10.5 g/dL — ABNORMAL LOW (ref 12.0–15.0)

## 2010-08-12 LAB — COMPREHENSIVE METABOLIC PANEL
ALT: 10 U/L (ref 0–35)
AST: 22 U/L (ref 0–37)
Albumin: 3 g/dL — ABNORMAL LOW (ref 3.5–5.2)
Alkaline Phosphatase: 53 U/L (ref 39–117)
BUN: 23 mg/dL (ref 6–23)
CO2: 26 mEq/L (ref 19–32)
Calcium: 8.9 mg/dL (ref 8.4–10.5)
Chloride: 100 mEq/L (ref 96–112)
Creatinine, Ser: 0.71 mg/dL (ref 0.4–1.2)
GFR calc Af Amer: >60 mL/min (ref >60)
GFR calc non Af Amer: >60 mL/min (ref >60)
Glucose, Bld: 132 mg/dL — ABNORMAL HIGH (ref 70–99)
Potassium: 3.6 mEq/L (ref 3.5–5.1)
Sodium: 136 mEq/L (ref 135–145)
Total Bilirubin: 0.7 mg/dL (ref 0.3–1.2)
Total Protein: 6 g/dL (ref 6.0–8.3)

## 2010-08-12 LAB — URINALYSIS, ROUTINE W REFLEX MICROSCOPIC
Ketones, ur: 15 mg/dL — AB
Leukocytes, UA: NEGATIVE
Nitrite: NEGATIVE
Protein, ur: NEGATIVE mg/dL
Specific Gravity, Urine: 1.022 (ref 1.005–1.030)
Urine Glucose, Fasting: NEGATIVE mg/dL
Urobilinogen, UA: 0.2 mg/dL (ref 0.0–1.0)
pH: 6 (ref 5.0–8.0)

## 2010-08-12 LAB — PROTIME-INR
INR: 1.1 (ref 0.00–1.49)
INR: 1.15 (ref 0.00–1.49)
Prothrombin Time: 14.4 seconds (ref 11.6–15.2)
Prothrombin Time: 14.9 seconds (ref 11.6–15.2)

## 2010-08-12 LAB — CBC
HCT: 28.4 % — ABNORMAL LOW (ref 36.0–46.0)
HCT: 20.2 % — ABNORMAL LOW (ref 36.0–46.0)
HCT: 27.6 % — ABNORMAL LOW (ref 36.0–46.0)
HCT: 28 % — ABNORMAL LOW (ref 36.0–46.0)
Hemoglobin: 9 g/dL — ABNORMAL LOW (ref 12.0–15.0)
Hemoglobin: 6.5 g/dL — CL (ref 12.0–15.0)
Hemoglobin: 9.4 g/dL — ABNORMAL LOW (ref 12.0–15.0)
Hemoglobin: 9.2 g/dL — ABNORMAL LOW (ref 12.0–15.0)
MCH: 31.6 pg (ref 26.0–34.0)
MCH: 31.7 pg (ref 26.0–34.0)
MCH: 31.2 pg (ref 26.0–34.0)
MCH: 30.3 pg (ref 26.0–34.0)
MCHC: 31.7 g/dL (ref 30.0–36.0)
MCHC: 32.2 g/dL (ref 30.0–36.0)
MCHC: 34.1 g/dL (ref 30.0–36.0)
MCHC: 32.9 g/dL (ref 30.0–36.0)
MCV: 99.6 fL (ref 78.0–100.0)
MCV: 98.5 fL (ref 78.0–100.0)
MCV: 91.7 fL (ref 78.0–100.0)
MCV: 92.1 fL (ref 78.0–100.0)
Platelets: 501 10*3/uL — ABNORMAL HIGH (ref 150–400)
Platelets: 363 10*3/uL (ref 150–400)
Platelets: 297 10*3/uL (ref 150–400)
Platelets: 299 10*3/uL (ref 150–400)
RBC: 2.85 MIL/uL — ABNORMAL LOW (ref 3.87–5.11)
RBC: 2.05 MIL/uL — ABNORMAL LOW (ref 3.87–5.11)
RBC: 3.01 MIL/uL — ABNORMAL LOW (ref 3.87–5.11)
RBC: 3.04 MIL/uL — ABNORMAL LOW (ref 3.87–5.11)
RDW: 14.1 % (ref 11.5–15.5)
RDW: 14.1 % (ref 11.5–15.5)
RDW: 17.1 % — ABNORMAL HIGH (ref 11.5–15.5)
RDW: 17.1 % — ABNORMAL HIGH (ref 11.5–15.5)
WBC: 10.2 10*3/uL (ref 4.0–10.5)
WBC: 6.9 10*3/uL (ref 4.0–10.5)
WBC: 6.6 10*3/uL (ref 4.0–10.5)
WBC: 8.8 10*3/uL (ref 4.0–10.5)

## 2010-08-12 LAB — DIFFERENTIAL
Basophils Absolute: 0 10*3/uL (ref 0.0–0.1)
Basophils Relative: 0 % (ref 0–1)
Eosinophils Absolute: 0.1 10*3/uL (ref 0.0–0.7)
Eosinophils Relative: 1 % (ref 0–5)
Lymphocytes Relative: 8 % — ABNORMAL LOW (ref 12–46)
Lymphs Abs: 0.8 10*3/uL (ref 0.7–4.0)
Monocytes Absolute: 1 10*3/uL (ref 0.1–1.0)
Monocytes Relative: 9 % (ref 3–12)
Neutro Abs: 8.4 10*3/uL — ABNORMAL HIGH (ref 1.7–7.7)
Neutrophils Relative %: 82 % — ABNORMAL HIGH (ref 43–77)

## 2010-08-12 LAB — OCCULT BLOOD, POC DEVICE: Fecal Occult Bld: POSITIVE

## 2010-08-12 LAB — FERRITIN: Ferritin: 77 ng/mL (ref 10–291)

## 2010-08-12 LAB — PREPARE RBC (CROSSMATCH)

## 2010-08-12 LAB — IRON AND TIBC
Iron: 116 ug/dL (ref 42–135)
Saturation Ratios: 42 % (ref 20–55)
TIBC: 275 ug/dL (ref 250–470)
UIBC: 159 ug/dL

## 2010-08-12 LAB — URINE MICROSCOPIC-ADD ON

## 2010-08-12 LAB — MRSA PCR SCREENING: MRSA by PCR: NEGATIVE

## 2010-08-12 LAB — MAGNESIUM: Magnesium: 1.8 mg/dL (ref 1.5–2.5)

## 2010-08-12 LAB — VITAMIN B12: Vitamin B-12: 595 pg/mL (ref 211–911)

## 2010-08-12 LAB — FOLATE: Folate: 9.8 ng/mL

## 2010-08-18 ENCOUNTER — Encounter: Payer: Self-pay | Admitting: General Surgery

## 2010-08-21 ENCOUNTER — Ambulatory Visit: Payer: Self-pay | Admitting: Oncology

## 2010-08-22 NOTE — Discharge Summary (Addendum)
[name], [name]               ACCOUNT NO.:  [id number]  MEDICAL RECORD NO.:  [id number]          PATIENT TYPE:  INP  LOCATION:  1404                         FACILITY:  [facility]  PHYSICIAN:  Richarda Overlie, MD       DATE OF BIRTH:  July 26, 1926  DATE OF ADMISSION:  08/02/2010 DATE OF DISCHARGE:  08/07/2010                              DISCHARGE SUMMARY   PRIMARY CARE PHYSICIAN:  C. Duane Lope, M.D.  PRIMARY GI PHYSICIAN:  [facility].  DISCHARGE DIAGNOSES: 1. Lower gastrointestinal bleeding, likely diverticula status post     transfusion of 5 units of blood. 2. Hypothyroidism. 3. Urinary incontinence. 4. Osteoarthritis.  DISCHARGE MEDICATIONS: 1. Ferrous sulfate 325 mg p.o. twice daily. 2. Protonix 40 mg p.o. daily. 3. K-Dur 10 mEq p.o. three times a day. 4. Bengay 1 application of topical twice daily. 5. Norco 1 tablet p.o. q.4-6h p.r.n. 6. Magnesium with vitamin D 1 tablet p.o. every other day. 7. MiraLAX daily p.r.n. 8. Oxybutynin patch transdermal every 3 days. 9. Synthroid 112 mcg p.o. in the morning. 10.Tramadol 50 mg 1-2 tablets p.o. twice daily.  PROCEDURES: 1. Nuclear medicine scan shows positive lower GI bleeding,     distribution in the mid-to-distal transverse colon. 2. CT scan of the abdomen and pelvis in 2010 showed diverticulosis     with diverticulitis.  SUBJECTIVE:  This is an 77 year old female who presents to the ED with a chief complaint of rectal bleeding.  The patient has noticed black stools and thought that was due to her iron.  She reportedly passed bright red blood before her presentation to the ED on the January 6. She also started developing weakness and shortness of breath.  She denied any chest pain.  Initial hemoglobin was found to be 9.0.  MCV was 99.6.  Her initial blood pressure and evaluation was found to be 87 systolic.  The patient was admitted for further evaluation.  HOSPITAL COURSE: 1. Lower GI bleeding.  This is likely  diverticular.  The patient was     evaluated by Dr. Dulce Sellar during this admission.  She has received 5     units of packed red blood cells.  The patient is currently     hemodynamically stable.  She has not passed any melena or bright     red blood for the last 36-40 hours and is requesting to go home.     The patient was evaluated by Dr. Dulce Sellar who recommended surgical     consultation.  However, the patient refuses to undergo surgery at     this time.  She has received 20 units of packed red blood cell     transfusions between 04/09 and 01/12. The patient is requesting to     go home, and the last hemoglobin was 10.5.  The patient was     evaluated by Dr. Dulce Sellar prior to her discharge, and the patient     will follow-up with Dr. Randa Evens in about 7-10 days.  She will need     a CBC in about 1 week.  She has been started on a mechanical soft  diet that she has tolerated well, and she was ambulating prior to     discharge.  DISCHARGE INSTRUCTIONS: 1. CBC in 1 week. 2. Mechanical soft diet. 3. Activity as tolerated. 4. Follow-up with Dr. Randa Evens in 1-2 weeks and follow up with PCP in 5-     7 days.     Richarda Overlie, MD     NA/MEDQ  D:  08/07/2010  T:  08/07/2010  Job:  [id number]  Electronically Signed by Richarda Overlie MD on 08/22/2010 07:42:12 AM

## 2010-08-23 LAB — CBC WITH DIFFERENTIAL/PLATELET
BASO%: 0.1 % (ref 0.0–2.0)
Basophils Absolute: 0 10*3/uL (ref 0.0–0.1)
EOS%: 0.5 % (ref 0.0–7.0)
Eosinophils Absolute: 0 10*3/uL (ref 0.0–0.5)
HCT: 31.2 % — ABNORMAL LOW (ref 34.8–46.6)
HGB: 10.3 g/dL — ABNORMAL LOW (ref 11.6–15.9)
LYMPH%: 8.2 % — ABNORMAL LOW (ref 14.0–49.7)
MCH: 30.6 pg (ref 25.1–34.0)
MCHC: 33 g/dL (ref 31.5–36.0)
MCV: 92.8 fL (ref 79.5–101.0)
MONO#: 0.8 10*3/uL (ref 0.1–0.9)
MONO%: 8.6 % (ref 0.0–14.0)
NEUT#: 7.2 10*3/uL — ABNORMAL HIGH (ref 1.5–6.5)
NEUT%: 82.6 % — ABNORMAL HIGH (ref 38.4–76.8)
Platelets: 640 10*3/uL — ABNORMAL HIGH (ref 145–400)
RBC: 3.36 10*6/uL — ABNORMAL LOW (ref 3.70–5.45)
RDW: 15.9 % — ABNORMAL HIGH (ref 11.2–14.5)
WBC: 8.7 10*3/uL (ref 3.9–10.3)
lymph#: 0.7 10*3/uL — ABNORMAL LOW (ref 0.9–3.3)

## 2010-10-13 LAB — CROSSMATCH: Antibody Screen: NEGATIVE

## 2010-10-14 LAB — URINE CULTURE: Colony Count: >=100000

## 2010-10-14 LAB — CBC
HCT: 28.6 % — ABNORMAL LOW (ref 36.0–46.0)
Hemoglobin: 9.2 g/dL — ABNORMAL LOW (ref 12.0–15.0)
MCV: 87.5 fL (ref 78.0–100.0)
Platelets: 478 10*3/uL — ABNORMAL HIGH (ref 150–400)
WBC: 11.2 10*3/uL — ABNORMAL HIGH (ref 4.0–10.5)

## 2010-10-14 LAB — APTT: aPTT: 31 seconds (ref 24–37)

## 2010-10-14 LAB — URINALYSIS, ROUTINE W REFLEX MICROSCOPIC
Bilirubin Urine: NEGATIVE
Glucose, UA: NEGATIVE mg/dL
Nitrite: POSITIVE — AB
Protein, ur: NEGATIVE mg/dL

## 2010-10-14 LAB — COMPREHENSIVE METABOLIC PANEL
Albumin: 3.2 g/dL — ABNORMAL LOW (ref 3.5–5.2)
Alkaline Phosphatase: 59 U/L (ref 39–117)
BUN: 16 mg/dL (ref 6–23)
Chloride: 96 mEq/L (ref 96–112)
Creatinine, Ser: 0.48 mg/dL (ref 0.4–1.2)
GFR calc non Af Amer: >60 mL/min (ref >60)
Glucose, Bld: 115 mg/dL — ABNORMAL HIGH (ref 70–99)
Potassium: 3.6 mEq/L (ref 3.5–5.1)
Total Bilirubin: 0.6 mg/dL (ref 0.3–1.2)

## 2010-10-14 LAB — PROTIME-INR
INR: 1.07 (ref 0.00–1.49)
Prothrombin Time: 13.8 seconds (ref 11.6–15.2)

## 2010-10-14 LAB — LIPASE, BLOOD: Lipase: 38 U/L (ref 11–59)

## 2010-10-14 LAB — URINE MICROSCOPIC-ADD ON

## 2010-10-14 LAB — TYPE AND SCREEN

## 2010-10-14 LAB — DIFFERENTIAL
Basophils Absolute: 0.2 10*3/uL — ABNORMAL HIGH (ref 0.0–0.1)
Basophils Relative: 1 % (ref 0–1)
Lymphocytes Relative: 9 % — ABNORMAL LOW (ref 12–46)
Monocytes Absolute: 0.6 10*3/uL (ref 0.1–1.0)
Neutro Abs: 9.4 10*3/uL — ABNORMAL HIGH (ref 1.7–7.7)
Neutrophils Relative %: 84 % — ABNORMAL HIGH (ref 43–77)

## 2010-10-16 ENCOUNTER — Encounter (HOSPITAL_BASED_OUTPATIENT_CLINIC_OR_DEPARTMENT_OTHER): Payer: Medicare Other | Admitting: Oncology

## 2010-10-16 ENCOUNTER — Other Ambulatory Visit: Payer: Self-pay | Admitting: Oncology

## 2010-10-16 DIAGNOSIS — D649 Anemia, unspecified: Secondary | ICD-10-CM

## 2010-10-16 DIAGNOSIS — K909 Intestinal malabsorption, unspecified: Secondary | ICD-10-CM

## 2010-10-16 DIAGNOSIS — K5733 Diverticulitis of large intestine without perforation or abscess with bleeding: Secondary | ICD-10-CM

## 2010-10-16 LAB — CBC WITH DIFFERENTIAL/PLATELET
BASO%: 0.7 % (ref 0.0–2.0)
Eosinophils Absolute: 0.1 10*3/uL (ref 0.0–0.5)
LYMPH%: 16.7 % (ref 14.0–49.7)
MCHC: 33.4 g/dL (ref 31.5–36.0)
MONO#: 0.8 10*3/uL (ref 0.1–0.9)
MONO%: 9.1 % (ref 0.0–14.0)
NEUT#: 6.2 10*3/uL (ref 1.5–6.5)
Platelets: 473 10*3/uL — ABNORMAL HIGH (ref 145–400)
RBC: 4.21 10*6/uL (ref 3.70–5.45)
RDW: 14.9 % — ABNORMAL HIGH (ref 11.2–14.5)
WBC: 8.5 10*3/uL (ref 3.9–10.3)

## 2010-10-16 LAB — FERRITIN: Ferritin: 48 ng/mL (ref 10–291)

## 2010-10-20 LAB — DIFFERENTIAL
Lymphocytes Relative: 8 % — ABNORMAL LOW (ref 12–46)
Lymphs Abs: 1.1 10*3/uL (ref 0.7–4.0)
Monocytes Relative: 5 % (ref 3–12)
Neutrophils Relative %: 86 % — ABNORMAL HIGH (ref 43–77)

## 2010-10-20 LAB — CROSSMATCH
ABO/RH(D): AB POS
Antibody Screen: NEGATIVE

## 2010-10-20 LAB — CBC
HCT: 24.5 % — ABNORMAL LOW (ref 36.0–46.0)
HCT: 27.5 % — ABNORMAL LOW (ref 36.0–46.0)
Hemoglobin: 9.4 g/dL — ABNORMAL LOW (ref 12.0–15.0)
MCV: 91.5 fL (ref 78.0–100.0)
MCV: 92.9 fL (ref 78.0–100.0)
Platelets: 463 10*3/uL — ABNORMAL HIGH (ref 150–400)
Platelets: 485 10*3/uL — ABNORMAL HIGH (ref 150–400)
Platelets: 499 10*3/uL — ABNORMAL HIGH (ref 150–400)
RBC: 1.96 MIL/uL — ABNORMAL LOW (ref 3.87–5.11)
RDW: 15.7 % — ABNORMAL HIGH (ref 11.5–15.5)
RDW: 16 % — ABNORMAL HIGH (ref 11.5–15.5)
WBC: 13.9 10*3/uL — ABNORMAL HIGH (ref 4.0–10.5)

## 2010-10-20 LAB — BASIC METABOLIC PANEL
BUN: 11 mg/dL (ref 6–23)
BUN: 14 mg/dL (ref 6–23)
BUN: 7 mg/dL (ref 6–23)
CO2: 27 mEq/L (ref 19–32)
Chloride: 103 mEq/L (ref 96–112)
Chloride: 96 mEq/L (ref 96–112)
Creatinine, Ser: 0.52 mg/dL (ref 0.4–1.2)
GFR calc Af Amer: >60 mL/min (ref >60)
GFR calc non Af Amer: >60 mL/min (ref >60)
GFR calc non Af Amer: >60 mL/min (ref >60)
Glucose, Bld: 94 mg/dL (ref 70–99)
Potassium: 3.5 mEq/L (ref 3.5–5.1)
Potassium: 3.9 mEq/L (ref 3.5–5.1)
Potassium: 4 mEq/L (ref 3.5–5.1)

## 2010-10-20 LAB — HEMOGLOBIN AND HEMATOCRIT, BLOOD
HCT: 25.3 % — ABNORMAL LOW (ref 36.0–46.0)
Hemoglobin: 8.4 g/dL — ABNORMAL LOW (ref 12.0–15.0)
Hemoglobin: 8.4 g/dL — ABNORMAL LOW (ref 12.0–15.0)
Hemoglobin: 8.7 g/dL — ABNORMAL LOW (ref 12.0–15.0)

## 2010-10-20 LAB — PREPARE RBC (CROSSMATCH)

## 2010-10-20 LAB — PROTIME-INR
INR: 1.03 (ref 0.00–1.49)
Prothrombin Time: 13.4 seconds (ref 11.6–15.2)

## 2010-10-28 ENCOUNTER — Other Ambulatory Visit: Payer: Self-pay | Admitting: Orthopedic Surgery

## 2010-10-28 ENCOUNTER — Encounter (HOSPITAL_COMMUNITY): Payer: Medicare Other | Attending: Orthopedic Surgery

## 2010-10-28 DIAGNOSIS — Z01812 Encounter for preprocedural laboratory examination: Secondary | ICD-10-CM | POA: Insufficient documentation

## 2010-10-28 DIAGNOSIS — Z79899 Other long term (current) drug therapy: Secondary | ICD-10-CM | POA: Insufficient documentation

## 2010-10-28 DIAGNOSIS — Z01811 Encounter for preprocedural respiratory examination: Secondary | ICD-10-CM | POA: Insufficient documentation

## 2010-10-28 DIAGNOSIS — M1710 Unilateral primary osteoarthritis, unspecified knee: Secondary | ICD-10-CM | POA: Insufficient documentation

## 2010-10-28 LAB — URINALYSIS, ROUTINE W REFLEX MICROSCOPIC
Bilirubin Urine: NEGATIVE
Nitrite: NEGATIVE
Protein, ur: NEGATIVE mg/dL
Specific Gravity, Urine: 1.025 (ref 1.005–1.030)
Urobilinogen, UA: 1 mg/dL (ref 0.0–1.0)

## 2010-10-28 LAB — DIFFERENTIAL
Basophils Absolute: 0 10*3/uL (ref 0.0–0.1)
Basophils Relative: 0 % (ref 0–1)
Eosinophils Absolute: 0 10*3/uL (ref 0.0–0.7)
Monocytes Absolute: 0.8 10*3/uL (ref 0.1–1.0)
Monocytes Relative: 8 % (ref 3–12)
Neutro Abs: 8 10*3/uL — ABNORMAL HIGH (ref 1.7–7.7)
Neutrophils Relative %: 81 % — ABNORMAL HIGH (ref 43–77)

## 2010-10-28 LAB — BASIC METABOLIC PANEL
Calcium: 9.4 mg/dL (ref 8.4–10.5)
GFR calc Af Amer: >60 mL/min (ref >60)
GFR calc non Af Amer: >60 mL/min (ref >60)
Potassium: 3.5 mEq/L (ref 3.5–5.1)
Sodium: 136 mEq/L (ref 135–145)

## 2010-10-28 LAB — CBC
HCT: 40.1 % (ref 36.0–46.0)
Hemoglobin: 12.5 g/dL (ref 12.0–15.0)
MCH: 28 pg (ref 26.0–34.0)
MCHC: 31.2 g/dL (ref 30.0–36.0)
RDW: 14.5 % (ref 11.5–15.5)

## 2010-10-28 LAB — PROTIME-INR
INR: 0.97 (ref 0.00–1.49)
Prothrombin Time: 13.1 seconds (ref 11.6–15.2)

## 2010-10-28 LAB — APTT: aPTT: 32 seconds (ref 24–37)

## 2010-10-28 LAB — SURGICAL PCR SCREEN
MRSA, PCR: NEGATIVE
Staphylococcus aureus: NEGATIVE

## 2010-11-01 LAB — BASIC METABOLIC PANEL
BUN: 17 mg/dL (ref 6–23)
BUN: 15 mg/dL (ref 6–23)
CO2: 26 mEq/L (ref 19–32)
CO2: 26 mEq/L (ref 19–32)
CO2: 26 mEq/L (ref 19–32)
Calcium: 9.1 mg/dL (ref 8.4–10.5)
Calcium: 8.4 mg/dL (ref 8.4–10.5)
Chloride: 95 mEq/L — ABNORMAL LOW (ref 96–112)
Creatinine, Ser: 0.57 mg/dL (ref 0.4–1.2)
Creatinine, Ser: 0.5 mg/dL (ref 0.4–1.2)
GFR calc Af Amer: >60 mL/min (ref >60)
GFR calc Af Amer: >60 mL/min (ref >60)
GFR calc non Af Amer: >60 mL/min (ref >60)
GFR calc non Af Amer: >60 mL/min (ref >60)
Glucose, Bld: 105 mg/dL — ABNORMAL HIGH (ref 70–99)
Glucose, Bld: 95 mg/dL (ref 70–99)
Potassium: 3.8 mEq/L (ref 3.5–5.1)
Potassium: 3.6 mEq/L (ref 3.5–5.1)
Sodium: 129 mEq/L — ABNORMAL LOW (ref 135–145)

## 2010-11-01 LAB — TYPE AND SCREEN: Antibody Screen: NEGATIVE

## 2010-11-01 LAB — CBC
HCT: 30.1 % — ABNORMAL LOW (ref 36.0–46.0)
HCT: 31 % — ABNORMAL LOW (ref 36.0–46.0)
HCT: 24 % — ABNORMAL LOW (ref 36.0–46.0)
HCT: 30.1 % — ABNORMAL LOW (ref 36.0–46.0)
Hemoglobin: 10 g/dL — ABNORMAL LOW (ref 12.0–15.0)
Hemoglobin: 10.2 g/dL — ABNORMAL LOW (ref 12.0–15.0)
MCHC: 32.9 g/dL (ref 30.0–36.0)
MCHC: 33.3 g/dL (ref 30.0–36.0)
MCHC: 33.9 g/dL (ref 30.0–36.0)
MCHC: 34 g/dL (ref 30.0–36.0)
MCV: 93.9 fL (ref 78.0–100.0)
MCV: 94.5 fL (ref 78.0–100.0)
MCV: 94.1 fL (ref 78.0–100.0)
MCV: 94.8 fL (ref 78.0–100.0)
Platelets: 418 10*3/uL — ABNORMAL HIGH (ref 150–400)
Platelets: 472 10*3/uL — ABNORMAL HIGH (ref 150–400)
Platelets: 502 10*3/uL — ABNORMAL HIGH (ref 150–400)
Platelets: 542 10*3/uL — ABNORMAL HIGH (ref 150–400)
RBC: 3.18 MIL/uL — ABNORMAL LOW (ref 3.87–5.11)
RBC: 3.3 MIL/uL — ABNORMAL LOW (ref 3.87–5.11)
RBC: 2.59 MIL/uL — ABNORMAL LOW (ref 3.87–5.11)
RDW: 14.7 % (ref 11.5–15.5)
RDW: 15 % (ref 11.5–15.5)
RDW: 14.5 % (ref 11.5–15.5)
RDW: 14.7 % (ref 11.5–15.5)
WBC: 6.2 10*3/uL (ref 4.0–10.5)
WBC: 7.2 10*3/uL (ref 4.0–10.5)

## 2010-11-01 LAB — COMPREHENSIVE METABOLIC PANEL
Albumin: 2.8 g/dL — ABNORMAL LOW (ref 3.5–5.2)
BUN: 15 mg/dL (ref 6–23)
Calcium: 8.7 mg/dL (ref 8.4–10.5)
Creatinine, Ser: 0.74 mg/dL (ref 0.4–1.2)
Total Protein: 6.3 g/dL (ref 6.0–8.3)

## 2010-11-01 LAB — POCT I-STAT 4, (NA,K, GLUC, HGB,HCT)
Glucose, Bld: 99 mg/dL (ref 70–99)
HCT: 34 % — ABNORMAL LOW (ref 36.0–46.0)
Hemoglobin: 11.6 g/dL — ABNORMAL LOW (ref 12.0–15.0)
Potassium: 3.5 mEq/L (ref 3.5–5.1)
Sodium: 134 mEq/L — ABNORMAL LOW (ref 135–145)

## 2010-11-01 LAB — ANAEROBIC CULTURE

## 2010-11-01 LAB — GRAM STAIN

## 2010-11-01 LAB — CULTURE, BLOOD (ROUTINE X 2): Culture: NO GROWTH

## 2010-11-01 LAB — DIFFERENTIAL
Basophils Absolute: 0.1 10*3/uL (ref 0.0–0.1)
Basophils Absolute: 0.7 10*3/uL — ABNORMAL HIGH (ref 0.0–0.1)
Basophils Relative: 0 % (ref 0–1)
Basophils Relative: 3 % — ABNORMAL HIGH (ref 0–1)
Eosinophils Absolute: 0 10*3/uL (ref 0.0–0.7)
Eosinophils Relative: 0 % (ref 0–5)
Lymphocytes Relative: 3 % — ABNORMAL LOW (ref 12–46)
Monocytes Relative: 3 % (ref 3–12)
Neutro Abs: 21.3 10*3/uL — ABNORMAL HIGH (ref 1.7–7.7)

## 2010-11-01 LAB — WOUND CULTURE: Culture: NO GROWTH

## 2010-11-01 LAB — OSMOLALITY, URINE: Osmolality, Ur: 305 mOsm/kg — ABNORMAL LOW (ref 390–1090)

## 2010-11-01 LAB — APTT: aPTT: 31 seconds (ref 24–37)

## 2010-11-01 LAB — HEMOGLOBIN AND HEMATOCRIT, BLOOD
HCT: 23.6 % — ABNORMAL LOW (ref 36.0–46.0)
HCT: 28.3 % — ABNORMAL LOW (ref 36.0–46.0)
Hemoglobin: 9.5 g/dL — ABNORMAL LOW (ref 12.0–15.0)

## 2010-11-01 LAB — TSH: TSH: 4.981 u[IU]/mL — ABNORMAL HIGH (ref 0.350–4.500)

## 2010-11-01 LAB — URINE CULTURE: Culture: NO GROWTH

## 2010-11-01 LAB — PROTIME-INR: INR: 1.1 (ref 0.00–1.49)

## 2010-11-01 LAB — SODIUM, URINE, RANDOM: Sodium, Ur: 49 mEq/L

## 2010-11-02 LAB — DIFFERENTIAL
Basophils Absolute: 0 10*3/uL (ref 0.0–0.1)
Basophils Relative: 0 % (ref 0–1)
Eosinophils Absolute: 0 10*3/uL (ref 0.0–0.7)
Eosinophils Relative: 0 % (ref 0–5)
Monocytes Absolute: 0.6 10*3/uL (ref 0.1–1.0)

## 2010-11-02 LAB — CBC
HCT: 29.8 % — ABNORMAL LOW (ref 36.0–46.0)
HCT: 31.2 % — ABNORMAL LOW (ref 36.0–46.0)
HCT: 29.5 % — ABNORMAL LOW (ref 36.0–46.0)
Hemoglobin: 10 g/dL — ABNORMAL LOW (ref 12.0–15.0)
Hemoglobin: 10.6 g/dL — ABNORMAL LOW (ref 12.0–15.0)
Hemoglobin: 9.6 g/dL — ABNORMAL LOW (ref 12.0–15.0)
MCHC: 33.6 g/dL (ref 30.0–36.0)
MCHC: 33.9 g/dL (ref 30.0–36.0)
MCHC: 32.7 g/dL (ref 30.0–36.0)
MCV: 95.6 fL (ref 78.0–100.0)
MCV: 95.8 fL (ref 78.0–100.0)
MCV: 93.1 fL (ref 78.0–100.0)
Platelets: 296 10*3/uL (ref 150–400)
Platelets: 296 10*3/uL (ref 150–400)
RBC: 3.11 MIL/uL — ABNORMAL LOW (ref 3.87–5.11)
RBC: 3.27 MIL/uL — ABNORMAL LOW (ref 3.87–5.11)
RDW: 14.4 % (ref 11.5–15.5)
RDW: 15.2 % (ref 11.5–15.5)
RDW: 16.6 % — ABNORMAL HIGH (ref 11.5–15.5)
WBC: 11.2 10*3/uL — ABNORMAL HIGH (ref 4.0–10.5)
WBC: 8.3 10*3/uL (ref 4.0–10.5)

## 2010-11-03 LAB — COMPREHENSIVE METABOLIC PANEL
ALT: 15 U/L (ref 0–35)
Albumin: 3.3 g/dL — ABNORMAL LOW (ref 3.5–5.2)
Alkaline Phosphatase: 37 U/L — ABNORMAL LOW (ref 39–117)
BUN: 12 mg/dL (ref 6–23)
CO2: 26 mEq/L (ref 19–32)
Chloride: 101 mEq/L (ref 96–112)
Creatinine, Ser: 0.47 mg/dL (ref 0.4–1.2)
GFR calc non Af Amer: >60 mL/min (ref >60)
Glucose, Bld: 101 mg/dL — ABNORMAL HIGH (ref 70–99)
Potassium: 3.7 mEq/L (ref 3.5–5.1)
Sodium: 136 mEq/L (ref 135–145)
Total Bilirubin: 0.5 mg/dL (ref 0.3–1.2)
Total Protein: 5.3 g/dL — ABNORMAL LOW (ref 6.0–8.3)

## 2010-11-03 LAB — CROSSMATCH: ABO/RH(D): AB POS

## 2010-11-03 LAB — TYPE AND SCREEN
ABO/RH(D): AB POS
Antibody Screen: NEGATIVE
Antibody Screen: NEGATIVE

## 2010-11-03 LAB — CBC
HCT: 24.7 % — ABNORMAL LOW (ref 36.0–46.0)
MCHC: 32.9 g/dL (ref 30.0–36.0)
MCV: 99.4 fL (ref 78.0–100.0)
Platelets: 343 10*3/uL (ref 150–400)
RBC: 2.48 MIL/uL — ABNORMAL LOW (ref 3.87–5.11)
RDW: 14.9 % (ref 11.5–15.5)
WBC: 7.2 10*3/uL (ref 4.0–10.5)

## 2010-11-03 LAB — DIFFERENTIAL
Basophils Absolute: 0 10*3/uL (ref 0.0–0.1)
Basophils Relative: 1 % (ref 0–1)
Eosinophils Absolute: 0 10*3/uL (ref 0.0–0.7)
Eosinophils Relative: 1 % (ref 0–5)
Lymphocytes Relative: 20 % (ref 12–46)
Lymphs Abs: 0.7 10*3/uL (ref 0.7–4.0)
Monocytes Absolute: 0.4 10*3/uL (ref 0.1–1.0)
Monocytes Relative: 5 % (ref 3–12)
Neutro Abs: 8.6 10*3/uL — ABNORMAL HIGH (ref 1.7–7.7)
Neutro Abs: 5 10*3/uL (ref 1.7–7.7)
Neutrophils Relative %: 70 % (ref 43–77)

## 2010-11-03 LAB — APTT: aPTT: 24 seconds (ref 24–37)

## 2010-11-03 LAB — PREPARE RBC (CROSSMATCH)

## 2010-11-03 LAB — SAMPLE TO BLOOD BANK

## 2010-11-05 ENCOUNTER — Inpatient Hospital Stay (HOSPITAL_COMMUNITY)
Admission: RE | Admit: 2010-11-05 | Discharge: 2010-11-09 | DRG: 470 | Disposition: A | Discharge status: Skilled Nursing Facility | Payer: Medicare Other | Source: Ambulatory Visit | Attending: Orthopedic Surgery | Admitting: Orthopedic Surgery

## 2010-11-05 DIAGNOSIS — M810 Age-related osteoporosis without current pathological fracture: Secondary | ICD-10-CM | POA: Diagnosis present

## 2010-11-05 DIAGNOSIS — M1710 Unilateral primary osteoarthritis, unspecified knee: Principal | ICD-10-CM | POA: Diagnosis present

## 2010-11-05 DIAGNOSIS — Z01812 Encounter for preprocedural laboratory examination: Secondary | ICD-10-CM

## 2010-11-05 DIAGNOSIS — E039 Hypothyroidism, unspecified: Secondary | ICD-10-CM | POA: Diagnosis present

## 2010-11-05 LAB — TYPE AND SCREEN

## 2010-11-06 LAB — BASIC METABOLIC PANEL
CO2: 27 mEq/L (ref 19–32)
Calcium: 8.1 mg/dL — ABNORMAL LOW (ref 8.4–10.5)
Glucose, Bld: 92 mg/dL (ref 70–99)
Potassium: 3.9 mEq/L (ref 3.5–5.1)
Sodium: 131 mEq/L — ABNORMAL LOW (ref 135–145)

## 2010-11-06 LAB — CBC
HCT: 30.3 % — ABNORMAL LOW (ref 36.0–46.0)
Hemoglobin: 9.8 g/dL — ABNORMAL LOW (ref 12.0–15.0)
MCHC: 32.3 g/dL (ref 30.0–36.0)

## 2010-11-07 LAB — CBC
HCT: 28.4 % — ABNORMAL LOW (ref 36.0–46.0)
Hemoglobin: 9.3 g/dL — ABNORMAL LOW (ref 12.0–15.0)
MCH: 28.7 pg (ref 26.0–34.0)
MCHC: 32.7 g/dL (ref 30.0–36.0)
MCV: 87.7 fL (ref 78.0–100.0)

## 2010-11-07 LAB — BASIC METABOLIC PANEL
BUN: 7 mg/dL (ref 6–23)
CO2: 27 mEq/L (ref 19–32)
Calcium: 8.4 mg/dL (ref 8.4–10.5)
Creatinine, Ser: 0.55 mg/dL (ref 0.4–1.2)
Glucose, Bld: 112 mg/dL — ABNORMAL HIGH (ref 70–99)

## 2010-11-07 NOTE — Op Note (Signed)
NAMETRACHELLE, LOW NO.:  [id number]  MEDICAL RECORD NO.:  [id number]           PATIENT TYPE:  I  LOCATION:  1604                         FACILITY:  [facility]  PHYSICIAN:  Madlyn Frankel. Charlann Boxer, M.D.  DATE OF BIRTH:  09-14-1925  DATE OF PROCEDURE:  11/05/2010 DATE OF DISCHARGE:                              OPERATIVE REPORT   PREOPERATIVE DIAGNOSIS:  Right knee osteoarthritis.  POSTOPERATIVE DIAGNOSES: 1. Right knee osteoarthritis. 2. Significant osteoporosis.  PROCEDURE:  Right total knee replacement.  COMPONENTS USED:  DePuy rotating platform, posterior stabilized knee platform system components with a size 3 femur, 3 M.B.T. revision tray, 10-mm insert, and a 38 patellar button.  SURGEON:  Madlyn Frankel. Charlann Boxer, MD  ASSISTANT:  Jaquelyn Bitter. Chabon, PA-C  ANESTHESIA:  Spinal.  SPECIMENS:  None.  COMPLICATIONS:  None.  INDICATIONS FOR PROCEDURE:  Ms. Coleson is an 77 year old female who presented to the office for right knee pain.  She had significant reduction of overall quality of life, significant reduction in her ability to get around as well because of her knee, she failed conservative measures and wished to proceed with more definitive measures.  Risks and benefits of knee replacement surgery discussed, particularly related to her age and activity level, including infection, DVT, component failure, need for revision surgery.  Following this discussion, consent was obtained for benefit of pain relief.  PROCEDURE IN DETAIL:  The patient was brought to the operative theater. Once adequate anesthesia, preoperative antibiotics, Ancef administered, she was positioned supine with the right leg in the proximal thigh tourniquet.  The right lower extremity was prepped and draped in sterile fashion with the right foot placed in [facility] leg holder.  Time-out was performed identifying the patient, planned procedure, and extremity.  At this point, the leg was  exsanguinated, tourniquet elevated to 250 mmHg.  Midline incision was made followed by a slight median arthrotomy with slight peel of soft tissues of the proximal tibia, but maintaining the continuity of the MCL.  Attention was first directed to the patella, precut measurement was about 22 mm.  I resected down to about 13, used a 38 button to restore height of bone.  At this point, it was noted to be significant osteoporotic.  The lug holes were drilled and a metal shim were placed to protect the cut surface from retracting saw blades.  At this point, attention was directed to femur.  Femoral canal was opened with a drill, irrigated to try to prevent fat emboli.  An intramedullary rod was passed and at 5 degrees of valgus, 10 mm of bone was resected off the distal femur.  Following this resection, we carefully subluxated the tibia anteriorly.  She had significant valgus deformity with most of the wear laterally.  The extramedullary guide was positioned, I resected 2-mm bone base at the proximal lateral tibia. Following this resection and removal of bone and remaining cruciate stumps and meniscus stumps, I confirmed the cut was perpendicular in coronal plane.  I then sized the femur to be a size 3.  The size 3 rotation block was then pinned into position using  the proximal tibial cut to set rotation. A 4:1 cutting block was then placed, anterior and posterior chamfer cuts were then made without difficulty nor notching.  The tibia subluxated anteriorly.  Using 3 tray, it seemed to fit best on the cut surface of the proximal tibia.  We pinned in position, drilled, and keel punched.  We performed a trial reduction with 3 femur, 3 tibia, and a 10-mm insert.  We found the knee came to full extension and the patella tracking through the trochlea find despite having her lateral valgus knee.  I made a determination at this point based on her significant osteoporotic bone to do an M.B.T.  revision tray.  The trial components were removed.  Final components were opened including the M.B.T. revision tray and final preparation of the tibia was carried out including placement of a cement restrictor.  Once this was in place, the knee was irrigated with normal saline solution, pulse lavaged.  The knee was injected with 0.25% Marcaine with epinephrine and Toradol.  The cement restrictor was placed deep on the tibia site.  Final components were cemented onto clean and dried cut surfaces of bone.  The knee was brought to extension with a 10-mm insert and extruded cement was removed.  Once the cement had fully cured, excessive cement was removed throughout the knee.  The 10-mm insert was chosen to match the 3 femur.  The knee was irrigated with normal saline solution.  Once the tourniquet had fully set up, the medium Hemovac drain was placed deep. Extensor mechanism was then reapproximated using #1 Vicryl with the knee in flexion.  The remaining of the wound was closed with 2-0 Vicryl and staples on the skin due to thin nature of the skin.  She was then brought to the recovery room in stable condition tolerating the procedure well.     Madlyn Frankel Charlann Boxer, M.D.     MDO/MEDQ  D:  11/05/2010  T:  11/06/2010  Job:  [id number]  Electronically Signed by Durene Romans M.D. on 11/07/2010 10:07:32 AM

## 2010-11-12 NOTE — Discharge Summary (Signed)
[name], [name] NO.:  [id number]  MEDICAL RECORD NO.:  [id number]           PATIENT TYPE:  I  LOCATION:  1604                         FACILITY:  [facility]  PHYSICIAN:  Madlyn Frankel. Charlann Boxer, M.D.  DATE OF BIRTH:  Apr 05, 1926  DATE OF ADMISSION:  11/05/2010 DATE OF DISCHARGE:                              DISCHARGE SUMMARY   ADMISSION DIAGNOSES: 1. Thyroid disease. 2. Right knee osteoarthritis with failed conservative treatment.  DISCHARGE DIAGNOSES: 1. Thyroid disease. 2. Status post right total-knee arthroplasty.  HISTORY OF PRESENT ILLNESS:  [name] is an 77 year old female with a history of osteoarthritis of the right knee which failed conservative treatment to alleviate pain.  The patient admitted to undergo right total-knee arthroplasty by Dr. Charlann Boxer.  SURGICAL PROCEDURES:  The patient was taken to the operating room on November 05, 2010, by Dr. Charlann Boxer assisted by Jodene Nam PA-C.  The patient was placed under spinal anesthesia and then underwent a right total-knee replacement.  Components used were DePuy rotating platform, posterior stabilized knee platform system components with a size 3 femur and 3 MBT revision tray and a 10-mm insert and a 38 patellar button. The patient tolerated the procedure well and returned to recovery in good and stable condition.  HOSPITAL COURSE:  The patient remained stable throughout the hospital stay and did not require a blood transfusion.  The patient progressing well with physical therapy.  CONSULTATIONS:  The following consultations were obtained while the patient was hospitalized. 1. PT and OT. 2. Case Management.  PREOPERATIVE LABORATORY DATA: 1. CBC on admission:  White count 9900, hemoglobin is 12.5, hematocrit     is 40.1, platelets 488,000. 2. BMET on admission:  Sodium 136, potassium 3.5, chloride 98,     bicarbonate was 30, glucose 97, BUN 21, creatinine 0.5, calcium     9.4. 3. PT/INR on admission:  PT  was 13.1, INR 0.97. 4. Postoperative day #2 laboratories:  White count 8000, hematocrit     28.7, platelets 345,000. 5. BMET on April 12 postoperative day #2:  Sodium 134, potassium 3.5,     chloride 101, bicarbonate 27, glucose 112, BUN 7, creatinine 0.5,     calcium 8.4.  MEDICATIONS ON THE FLOOR: 1. Colace 100 mg p.o. b.i.d. 2. Ferrous sulfate 325 mg one p.o. t.i.d. 3. Norco 7.5/325 one to two tablets p.o. q.4 hours p.r.n. pain. 4. Synthroid 112 mcg p.o. daily. 5. Oxybutynin 3.9 mg transdermal patch q.72 hours. 6. Xarelto 10 mg p.o. daily. 7. Tylenol 325-650 mg p.o. p.r.n. 8. Dulcolax 10 mg PR p.r.n. 9. Aluminum hydroxide suspension 30 mL p.o. q.4 hours p.r.n. 10.Dilaudid 0.5 mg to 1 mg IV q.2 hours p.r.n. severe pain. 11.Ativan 0.5 mg IV q.8 hours p.r.n. 12.Robaxin 500 mg p.o. IV q.6 hours p.r.n. 13.Reglan 5-10 mg p.o. q.8 hours p.r.n. nausea. 14.Zofran 4 mg IV p.o. q.6 hours p.r.n. nausea. 15.MiraLax 17 grams p.o. daily p.r.n. 16.Senokot 133 mL PR daily p.r.n. 17.Ambien 5 mg p.o. q.h.s. p.r.n. insomnia.  DISCHARGE MEDICATIONS: 1. Dulcolax 100 mg p.o. b.i.d. 2. Hydrocodone 7.5/325 one to two tablets p.o. q.4-6 hours p.r.n.  pain. 3. Methocarbamol 500 mg p.o. q.6 hours as needed for spasm. 4. Aspirin enteric-coated 325 mg one b.i.d. for 30 days. 5. Bengay cream topical as needed. 6. Ferrous sulfate one tablet p.o. twice daily. 7. Magnesium/calcium/vitamin D over-the-counter one p.o. every other     day. 8. MiraLax 17-34 grams daily as needed. 9. Oxytrol patch one patch transdermal every 3 days. 10.Synthroid 112 mcg one tablet p.o. q.a.m.  DISCHARGE INSTRUCTIONS:  Home medications to be adjusted by skilled facility nursing physician.  DIET:  Regular diet.  WEIGHTBEARING STATUS:  The patient is weightbearing as tolerated on the right knee.  WOUND CARE:  Keep the wound clean and dry.  Daily dressing changes.  CONDITION ON DISCHARGE:  The patient discharged to a  skilled facility in good and stable condition.  Discharge to skilled nursing facility when bed available.  FOLLOWUP:  The patient needs to follow up with Dr. Charlann Boxer in the office in 2 weeks.  Please call the office at [phone] for appointment.     Richardean Canal, P.A.   ______________________________ Madlyn Frankel Charlann Boxer, M.D.    GC/MEDQ  D:  11/08/2010  T:  11/08/2010  Job:  [id number]  cc:   Madlyn Frankel Charlann Boxer, M.D. Fax: [phone]  Electronically Signed by Richardean Canal P.A. on 11/11/2010 01:15:32 PM Electronically Signed by Durene Romans M.D. on 11/12/2010 07:42:09 AM

## 2010-11-15 NOTE — Discharge Summary (Signed)
  NAMETERRIE, HARING NO.:  [id number]  MEDICAL RECORD NO.:  [id number]           PATIENT TYPE:  O  LOCATION:  PADM                         FACILITY:  [facility]  PHYSICIAN:  Madlyn Frankel. Charlann Boxer, M.D.  DATE OF BIRTH:  June 29, 1926  DATE OF ADMISSION:  10/28/2010 DATE OF DISCHARGE:  10/28/2010                        DISCHARGE SUMMARY - REFERRING   ADDENDUM:  ADMITTING DISCHARGE DIAGNOSES:  Please see previous discharge summary.  PROCEDURE:  Right total knee.  HOSPITAL COURSE:  The patient was kept in the hospital on the 13th due to some social work discharge disposition issues.  It was noted that a bed did become available at [facility] and they had arranged for her to go on Saturday, November 09, 2010.  She was seen in rounds, doing fine and discharged over to [facility].  DISCHARGE MEDS:  Please see previously dictated summary.  PLAN:  Please see previously dictated summary.  CONDITION ON DISCHARGE:  Improved.     Alexzandrew L. Perkins, P.A.C.   ______________________________ Madlyn Frankel Charlann Boxer, M.D.    ALP/MEDQ  D:  11/09/2010  T:  11/09/2010  Job:  [id number]  Electronically Signed by Patrica Duel P.A.C. on 11/13/2010 12:45:53 PM Electronically Signed by Durene Romans M.D. on 11/15/2010 03:39:47 PM

## 2010-11-29 NOTE — H&P (Signed)
[name], [name] NO.:  [id number]  MEDICAL RECORD NO.:  [id number]          PATIENT TYPE:  LOCATION:                                 FACILITY:  PHYSICIAN:  Madlyn Frankel. Charlann Boxer, M.D.       DATE OF BIRTH:  DATE OF ADMISSION: DATE OF DISCHARGE:                             HISTORY & PHYSICAL   CHIEF COMPLAINT:  Right knee osteoarthritis.  HISTORY OF PRESENT ILLNESS:  This is a 77 year old lady with a history of osteoarthritis of her right knee with failure of conservative treatment to alleviate her pain.  After discussion of treatment, benefits, risks, and options, the patient is now scheduled for total knee arthroplasty of the right knee.  Note that the patient is a candidate for tranexamic acid and she will get it in the preop per protocol.  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  CURRENT MEDICATIONS: 1. Synthroid 0.112 mcg q.a.m. 2. Oxytrol 3.5 mg patch. 3. Tramadol 1-2 q. 12h. p.r.n. pain. 4. Hydrocodone 5 mg q.4-6 h. p.r.n. pain.  MEDICAL ILLNESSES:  History of diverticulitis and arthritis.  PREVIOUS SURGERIES:  Ectopic pregnancy, tonsillectomy, hysterectomy, and bowel obstruction surgery.  FAMILY HISTORY:  Positive for tuberculosis.  SOCIAL HISTORY:  The patient is widowed.  She is retired.  She drinks 2- 4 ounces of scotch per day and does not smoke.  REVIEW OF SYSTEMS:  CENTRAL NERVOUS SYSTEM:  Negative for headache, blurred vision, or dizziness.  PULMONARY:  Negative for shortness breath, PND, and orthopnea but positive for exertional shortness of breath.  CARDIOVASCULAR:  Negative for chest pain or palpitation.  GI: Positive for history of diverticulosis.  Negative for ulcers or hepatitis.  GU: Positive for urinary incontinence.  MUSCULOSKELETAL: Positive as in HPI.  PHYSICAL EXAMINATION:  VITAL SIGNS:  BP 160/80, respirations 16, pulse 76 and regular. GENERAL APPEARANCE:  This is a well-developed, well-nourished lady in  no acute distress. HEENT:  Head normocephalic.  Nose patent.  Ears patent.  Pupils are equal, status post bilateral cataract.  Throat without injection. NECK:  Supple without adenopathy.  Carotids are 2+ without bruit. CHEST:  Clear to auscultation.  No rales or rhonchi.  Respirations 16. HEART:  Regular rate and rhythm at 76 beats per minute without murmur. ABDOMEN:  Soft with active bowel sounds.  No masses or organomegaly. NEUROLOGIC:  The patient is alert and oriented to time, place, and person.  Cranial nerves II-XII grossly intact. EXTREMITIES:  Show the right knee with a valgus deformity, 0-125 degree range of motion. NEUROLOGIC:  Neurovascular status intact.  IMPRESSION:  Right knee osteoarthritis.  Plan is total knee arthroplasty, right knee.  Please note the patient is a candidate for tranexamic acid and will get this in the preop holding area.  She is not given her postop medications as she will be probably going to rehab.     Jaquelyn Bitter. Chabon, P.A.   ______________________________ Madlyn Frankel Charlann Boxer, M.D.    SJC/MEDQ  D:  10/23/2010  T:  10/24/2010  Job:  [id number]  Electronically Signed by Jodene Nam P.A. on 11/29/2010 08:59:07 AM Electronically Signed by  Durene Romans M.D. on 11/29/2010 02:27:17 PM

## 2010-12-10 NOTE — Discharge Summary (Signed)
[name], [name]               ACCOUNT NO.:  [id number]   MEDICAL RECORD NO.:  [id number]          PATIENT TYPE:  INP   LOCATION:  1321                         FACILITY:  [facility]   PHYSICIAN:  John C. Madilyn Fireman, M.D.    DATE OF BIRTH:  05/14/1926   DATE OF ADMISSION:  11/03/2007  DATE OF DISCHARGE:  11/04/2007                               DISCHARGE SUMMARY   HISTORY OF PRESENT ILLNESS:  The patient is 77 year old white female who  has had multiple diverticular bleeds who presented with 1 day history of  multiple dark red bowel movements with no abdominal pain.  Her admission  hemoglobin was 8.5 and she was hemodynamically stable.  For details  please see admission history and physical.   COURSE IN THE HOSPITAL:  Her stools were monitored.  She only had one  small dark stool after being admitted and this was on 11/04/2007.  She  was transfused 1 unit of packed red blood cells.  Her hemoglobin was  8.8.  She was quite vigorous and ambulatory and expressed desire to go  home even on the day of admission.  We kept her overnight for further  observation to document clinical stability.  Her BUN was normal on  admission and her brief hospitalization was uneventful.  She had been  taking some aspirin at home for arthritis pain.  On 11/04/2007 she was  felt stable for discharge.   DISCHARGE DIAGNOSIS:  Probable diverticular bleed.   PLAN:  1. Discharge off her aspirin.  2. Discharge follow-up with Dr. Madilyn Fireman in 2-3 weeks.  3. Call Dr. Madilyn Fireman for any signs of recurrent bleeding.   DISCHARGE MEDICATIONS:  1. Synthroid 0.1 mg p.o. q.a.m.  2. Oxytrol patch change every 3 days alternating with Detrol LA 4 mg      once daily rotating each medicine on a monthly basis.   CONDITION ON DISCHARGE:  Improved.           ______________________________  Everardo All Madilyn Fireman, M.D.     JCH/MEDQ  D:  11/04/2007  T:  11/04/2007  Job:  [id number]   cc:   Fayrene Fearing L. Malon Kindle., M.D.  Fax: [phone]   Miguel Aschoff, M.D.  Fax: [phone]

## 2010-12-10 NOTE — Consult Note (Signed)
NAMENAVAH, GRONDIN NO.:  [id number]   MEDICAL RECORD NO.:  [id number]          PATIENT TYPE:  INP   LOCATION:  5017                         FACILITY:  [facility]   PHYSICIAN:  Artist Pais. Weingold, M.D.DATE OF BIRTH:  October 25, 1925   DATE OF CONSULTATION:  DATE OF DISCHARGE:                                 CONSULTATION   REQUESTING PHYSICIAN:  Nelva Nay, MD   REASON FOR CONSULTATION:  Ms. Studer is a very pleasant 77 year old who  is right-hand dominant, unfortunately was scratched by her neighbor's  cat 24 hours ago, who presents today with increasing pain, swelling, and  erythema on her right upper extremity.  She is 77 years old.   She has an allergy to CODEINE.   She is currently on Synthroid.   No recent hospitalizations or surgery.   FAMILY MEDICAL HISTORY:  Noncontributory.   SOCIAL HISTORY:  Noncontributory.   PHYSICAL EXAMINATION:  Today, a well-nourished female, pleasant, alert  and oriented x3.  Examination of her upper extremity, she has diffuse  global swelling over the dorsum of her hand on right side.  2+ radial  pulse.  Brisk cap refill.  Kanavel signs are negative.  No significant  pain over the flexor side, but the extensor side is swollen and tender  from the PIP joints to the level of the metacarpal area.   X-rays were deferred and the left upper extremity is normal by  comparison.   IMPRESSION:  An 83-year female with cellulitis secondary to a cat  scratch 24 hours.  My recommendation at this point in time is strict  elevation and admission with IV Unasyn 3 g IV bolus followed by 1.5 g  q.6 h. with continued clinical followup and hopefully switch over to  oral antibiotics in the next 24-48 hours.      Artist Pais Mina Marble, M.D.  Electronically Signed     MAW/MEDQ  D:  03/21/2009  T:  03/22/2009  Job:  [id number]

## 2010-12-10 NOTE — H&P (Signed)
[name], [name]               ACCOUNT NO.:  [id number]   MEDICAL RECORD NO.:  [id number]          PATIENT TYPE:  INP   LOCATION:  1321                         FACILITY:  [facility]   PHYSICIAN:  John C. Madilyn Fireman, M.D.    DATE OF BIRTH:  12-04-1925   DATE OF ADMISSION:  11/03/2007  DATE OF DISCHARGE:                              HISTORY & PHYSICAL   CHIEF COMPLAINT:  Rectal bleeding.   HISTORY OF PRESENT ILLNESS:  The patient is a 77 year old white female  who presents with painless gross hematochezia of two days' duration with  mild weakness.  No abdominal pain.  She has had diverticular bleeds in  the past.  She came to the emergency room.  She had stable vital signs  but a low hemoglobin at 24 and guaiac-positive stool.  Her BUN and  creatinine were normal.   PAST MEDICAL HISTORY:  1. Chronic pain related to some unspecified arthralgia or possibly      fibromyalgia.  2. Hypothyroidism.  3. Chronic iron deficiency anemia, treated with IV iron in the past.   PAST SURGICAL HISTORY:  1. Ectopic pregnancy.  2. Hysterectomy.  3. Bowel obstruction surgery in 1985.   MEDICATIONS:  1. Synthroid 0.1 mg daily.  2. Detrol LA 4 mg once daily.  3. Oxytrol patch, change every three days, on daily.  4. Detrol LA every month.  5. Nabumetone p.r.n. pain.   ALLERGIES:  CODEINE.   SOCIAL HISTORY:  The patient denies alcohol or tobacco use.   PHYSICAL EXAMINATION:  GENERAL:  A well-developed and well-nourished  white female, in no acute distress.  HEART:  A regular rate and rhythm without murmur.  LUNGS:  Clear.  A  Soft, nondistended with normoactive bowel sounds.  No  hepatosplenomegaly, mass or guarding.   IMPRESSION:  Painless hematochezia, likely secondary to diverticular  bleed.  Last colon screening by virtual colonoscopy and flexible  sigmoidoscopy one year ago.   PLAN:  1. Admit.  2. Already transfusion of 1 unit of packed red blood cells.  3. Re-monitor stools and  hemoglobin.           ______________________________  Everardo All Madilyn Fireman, M.D.     JCH/MEDQ  D:  11/03/2007  T:  11/03/2007  Job:  [id number]   cc:   C. Duane Lope, M.D.  Fax: [phone]   Llana Aliment. Malon Kindle., M.D.  Fax: [phone]

## 2010-12-10 NOTE — Discharge Summary (Signed)
[name], KILLILEA NO.:  [id number]   MEDICAL RECORD NO.:  [id number]           PATIENT TYPE:   LOCATION:                                 FACILITY:   PHYSICIAN:  Artist Pais. Mina Marble, M.D.   DATE OF BIRTH:   DATE OF ADMISSION:  DATE OF DISCHARGE:                               DISCHARGE SUMMARY   PRINCIPAL DIAGNOSIS:  Cat scratch fever, right hand.   SECONDARY DIAGNOSES:  1. Hypothyroidism.  2. Hypertension.   PRINCIPAL PROCEDURE:  Admission with IV antibiotics.   DISCHARGE DIAGNOSIS:  Cat scratch.   The patient is an 77 year old right-hand dominant female who was  admitted after being seen in our office for a cat scratch to the dorsal  aspect of her right hand.   MEDICATIONS:  Upon admission, she was on Synthroid, Oxytrol patch, K-  Dur, nabumetone, and she had failed oral Augmentin.   ALLERGIES:  She has an allergy to CODEINE.   PHYSICAL EXAMINATION:  Physical examination revealed global pain and  swelling over the dorsal aspect of her right hand.  White count of  14,000.  She was admitted to the floor and spent the next few days on IV  Unasyn at 3.0 g loading dose followed by 1.5 g q.6 h.  Her white count  went from 14, to 11, to 8.  She was discharged on Saturday, March 24, 2009, with almost complete recovery of her swelling.  She was discharged  on p.o. Augmentin 875 mg 1 p.o. b.i.d. for 10 days as well as Medrol  Dosepak 4 mg for 6 days.  She will follow up in my office on Tuesday,  March 27, 2009.   DISCHARGE DIAGNOSIS:  Cat scratch fever and she was discharged stable to  home.       Artist Pais Mina Marble, M.D.  Electronically Signed     MAW/MEDQ  D:  03/24/2009  T:  03/24/2009  Job:  [id number]

## 2010-12-10 NOTE — H&P (Signed)
[name], Munoz NO.:  [id number]   MEDICAL RECORD NO.:  [id number]          PATIENT TYPE:  EMS   LOCATION:  ED                           FACILITY:  [facility]   PHYSICIAN:  Corinna L. Lendell Caprice, MDDATE OF BIRTH:  12-25-1925   DATE OF ADMISSION:  06/25/2008  DATE OF DISCHARGE:                              HISTORY & PHYSICAL   CHIEF COMPLAINT:  Bloody bowel movement and dizziness.   HISTORY OF PRESENT ILLNESS:  [name] is a pleasant 77 year old white  female who has a history of a presumed diverticular bleed earlier this  year.  She had been on aspirin and was told to stop this.  It is unclear  whether she has followed up with Dr. Tenny Craw or Dr. Madilyn Fireman but apparently  does get IV iron at the [facility] periodically.  She does not know  what her last hemoglobin was, I do not have records.  She has no  abdominal pain.  She has no fevers, chills, or nausea.  She had a  virtual colonoscopy and flexible sigmoidoscopy 1-1/2 years ago. The  patient has been feeling dizzy.  She has had about three episodes of  bloody bowel movements yesterday and several more today.  She also has  been dyspneic on exertion.  She has had no chest pain.  Apparently, her  blood pressure was 80/50, according to EMS records, and she received IV  fluids en route.  Her blood pressure was normal when she arrived in the  emergency room but her pulse was high.   PAST MEDICAL HISTORY:  As above, also hypothyroidism and chronic iron-  deficiency anemia.   MEDICATIONS:  1. Relafen as needed.  2. Oxytrol patch.  3. She makes no mention of Synthroid but she had been on Synthroid 0.1      mg back in April.  4. Cymbalta is listed on her triage notes which she did denies being      on this.   PAST SURGICAL HISTORY:  Surgery for ectopic pregnancy and hysterectomy,  and abdominal surgery for bowel obstruction in 1985.   SOCIAL HISTORY:  The patient does not drink or smoke.   FAMILY HISTORY:   Noncontributory.   REVIEW OF SYSTEMS:  Review of systems as above, otherwise negative.   PHYSICAL EXAMINATION:  VITAL SIGNS:  Temperature is 97.9; blood pressure  initially 80/50 when EMS arrived, currently 122/64; pulse initially 129,  currently 85 after IV fluids; respiratory rate 16, oxygen saturation  100% on 2 liters nasal cannula.  GENERAL:  The patient appears younger than her stated age.  She is in no  acute distress, and she is sitting upright and comfortable.  HEENT: Normocephalic, atraumatic.  She has very pale conjunctivae.  Moist mucous membranes.  NECK:  Supple.  No lymphadenopathy.  LUNGS:  Clear to auscultation bilaterally without wheezes, rhonchi, or  rales.  CARDIOVASCULAR:  Regular rate and rhythm without murmurs, gallops, or  rubs.  ABDOMEN:  Normal bowel sounds, soft, nontender, nondistended.  GU:  Deferred.  RECTAL:  She had a bloody bowel movement while here in the  emergency  room which, from what I understand, was mainly blood not much stool.  EXTREMITIES:  Trace edema.  NEUROLOGIC:  She is hard of hearing.  Cranial nerves and sensorimotor  exam are intact.  PSYCHIATRIC:  The patient is alert and oriented.  She is calm, pleasant,  and cooperative.  SKIN:  Pale, no rash.   LABS:  White count 8, hemoglobin 5.6, hematocrit 16.8, MCV is 96,  platelet count 291,000.  INR 1.28 PTT 25.  Sodium 131, potassium 3.6,  chloride 98, bicarbonate 24, glucose 160, BUN 32, creatinine 0.58.  LFT  significant for a total protein of 4.7 and albumin of 2.8.   ASSESSMENT/PLAN:  1. Lower gastrointestinal bleed:  Suspect diverticular, recurrent.      The patient will be on a clear liquid diet.  She has been ordered      for 2 units of packed red blood cells and she has been fluid-      resuscitated here in the emergency room.  She will get serial      hemoglobins and hematocrits.  I will notify [facility] in the      morning.  The patient will be admitted to telemetry, as she  was      tachycardiac initially on admission.  I presume it was sinus      tachycardia.  2. Hyperglycemia, suspect stress response.  This will be followed.  3. Elevated BUN secondary to gastrointestinal bleed.  4. Chronic pain, question osteoarthritis.  Certainly the Relafen is      not helping with her bleeding issues although this appears to be      lower.  5. Hypothyroidism.  I will check a TSH with the next blood draw.      Corinna L. Lendell Caprice, MD  Electronically Signed     CLS/MEDQ  D:  06/25/2008  T:  06/25/2008  Job:  [id number]   cc:   C. Duane Lope, M.D.  Fax: [phone]   Llana Aliment. Malon Kindle., M.D.  Fax: [phone]   Everardo All. Madilyn Fireman, M.D.  Fax: [phone]

## 2010-12-10 NOTE — Discharge Summary (Signed)
[name], [name]               ACCOUNT NO.:  [id number]   MEDICAL RECORD NO.:  [id number]          PATIENT TYPE:  INP   LOCATION:  1412                         FACILITY:  [facility]   PHYSICIAN:  Corinna L. Lendell Caprice, MDDATE OF BIRTH:  1925/11/25   DATE OF ADMISSION:  06/25/2008  DATE OF DISCHARGE:  06/27/2008                               DISCHARGE SUMMARY   DISCHARGE DIAGNOSES:  1. Lower gastrointestinal bleed, resolved, presumed secondary to      diverticular bleed, follow up as outpatient with Dr. Randa Evens.  2. Hypokalemia, repleted.  3. Acute blood loss anemia.  4. Hypothyroidism.   DISCHARGE MEDICATIONS:  1. Synthroid 0.1 mg a day.  2. Cymbalta.  3. Tylenol as needed.  4. Continue IV iron as an outpatient.   DIET:  As tolerated.   FOLLOW UP:  1. Follow up with Dr. Tenny Craw in 2-4 weeks.  She will need a repeat      potassium level drawn. 2.  Follow up with Dr. Randa Evens in 2-4 weeks.      She will also need repeat hemoglobin level drawn.  2. Follow up with Dr. Cyndie Chime as scheduled for IV iron.   CONDITION:  Stable.   CONSULTATIONS:  None.   PROCEDURE:  None.   LABORATORY DATA:  Hemoglobin on admission 5.6, hematocrit 16.8, MCV 96.  White blood cell count normal.  At discharge, her hemoglobin is stable  at 9.5 after transfusion of 4 units of packed red blood cells.  Complete  metabolic panel on admission significant for glucose of 160, BUN of 32,  sodium of 131.  At discharge, her sodium is 136, her potassium is 2.8  and she has received 80 mEq of potassium today.  Magnesium 1.9.  BUN at  discharge 8.  TSH 4.330.   DIAGNOSTICS:  EKG shows normal sinus rhythm, nonspecific changes.   HISTORY/HOSPITAL COURSE:  [name] is a pleasant and quite independent  77 year old white female with a history of presumed diverticular bleed  in April 2009.  At that point, she was treated with blood transfusion  and aspirin was stopped.  I do not think that she has followed up with  Dr. Randa Evens since then.  She reports that she gets IV iron through Dr.  Cyndie Chime, although I have no details.  I have asked for records  twice.  I am unsure what her preadmission hemoglobin was.  She presented  with several episodes of bloody bowel movements.  She did have an  episode of blood per rectum in the emergency room.  However, she had no  further bleeding.  She was transfused 4 units of packed red blood cells  and had no further bleeding.  Her diet was advanced.  Her blood pressure  initially was 80/50 when EMS arrived and pulse initially 129, but these  normalized after fluid resuscitation.  She has had no nausea, vomiting,  abdominal pain or weight loss.  I suspect this is another diverticular  bleed.  As the bleeding has not recurred, I do not think that further  workup is necessary at this time, but possibly as  an outpatient and I  have asked that she follow up with Dr. Randa Evens.  At this time, she is  tolerating a regular diet.  She is ambulating and feeling quite well.      Corinna L. Lendell Caprice, MD  Electronically Signed     CLS/MEDQ  D:  06/27/2008  T:  06/27/2008  Job:  [id number]

## 2010-12-10 NOTE — H&P (Signed)
NAMERUSSIE, GULLEDGE NO.:  [id number]   MEDICAL RECORD NO.:  [id number]          PATIENT TYPE:  INP   LOCATION:  0103                         FACILITY:  [facility]   PHYSICIAN:  Donalynn Furlong, MD      DATE OF BIRTH:  [birth year]   DATE OF ADMISSION:  02/24/2009  DATE OF DISCHARGE:                              HISTORY & PHYSICAL   PRIMARY CARE PHYSICIAN:  Miguel Aschoff, M.D.   CHIEF COMPLAINT:  Bright red blood per rectum.   HISTORY OF PRESENT ILLNESS:  [name] is an 77 year old  Caucasian female who lives in [place] by herself.  She presented to  [facility] emergency department tonight.  She started having gross  bright red blood per rectum with a bowel movement around the evening  time today.  She had three bowel movements at home.  First bowel  movement had the maximum amount of blood and the second two bowel  movements had a decreasing amount of blood.  She had a couple bowel  movements in the ER but those were black stool, but no active blood  noted.  On rectal exam she was found to have some blood at the rectum  too.  The rectal exam was done by the ER physician.  She denies any  abdominal pain except some occasional cramping.  She denies any nausea,  vomiting, or pulmonary, cardiac, urinary symptoms at this time.  She  denies any headache or any other symptoms at all.. She felt dizzy,  lightheaded, after having blood in her stool and that is why she decided  to come to the ER.  She has had a history of similar episodes many times  in the past and has received blood transfusions in the past.  Last blood  transfusion was on July 3.   PAST MEDICAL HISTORY:  1. Positive for anemia due to blood loss.  2. Osteoarthritis.  3. Diverticulosis.  4. GI bleed.  5. Hypothyroidism.  6. Hiatal hernia.  7. Depression.  8. History of hysterectomy.  9. Appendectomy.  10.History of lysis of adhesions in the abdomen in the past.  11.She has over reactive  bladder also.   PAST SURGICAL HISTORY:  As per past medical history.   SOCIAL HISTORY:  The patient denies any alcohol, drug, tobacco use  recently.   FAMILY HISTORY:  Nothing contributory to the current problem.   REVIEW OF SYSTEMS:  Positive as per HPI, otherwise negative review of  systems done for 14 systems.   HOME MEDICATIONS:  Synthroid, Nabumetone, and Oxytrol.  She is does not  have her entire list with her and the list is incomplete at this time.   ALLERGIES:  CODEINE.   PHYSICAL EXAMINATION:  VITAL SIGNS:  Blood pressure 115/68, pulse 77,  respiration 18, temperature 98, oxygen saturation 100% on room air.  GENERAL:  The patient is laying in bed without any acute distress.  She  is very pleasant and conversant.  CARDIOVASCULAR:  S1/S2 regular.  No  murmur, rub, or gallop.  LUNGS:  Clear to auscultation bilaterally.  No wheezing,  rhonchi,  crackles.  ABDOMEN:  Nontender, nondistended.  Bowel sounds present.  EXTREMITIES:  No clubbing, cyanosis or edema  Positive movement in all  four extremities.  HEENT:  Head normocephalic nontraumatic.  EYES:  Pupils equal, react to  light and accommodation.  Extraocular muscles intact.  Oral cavity:  Oral mucosa moist.  No thrush noted.  NECK:  No thyromegaly or JVD.  SKIN:  No rash or bruits.  NEUROLOGICAL:  Exam shows intact cranial nerves, muscular sensation and  reflexes.   LABORATORY DATA:  Lab work shows a CMP unremarkable except glucose 140.  Alkaline phosphatase 37, total protein 5.3, albumin 3.3, PTT 24.  PT  14.2, INR 1.1.  CBC with differential shows hemoglobin 9.5, otherwise  unremarkable.  No imaging has been done.   ASSESSMENT/PLAN:  1. Acute bright red blood per rectum in patient with a history of      diverticulosis suggestive of possible diverticular bleed which is      improving now.  2. Symptomatic anemia in patient with pallor, lightheadedness and      dizziness.  3. History of osteoarthritis.  4.  History of diverticulosis  5. History of GI bleed.  6. History of hypothyroidism.  7. History of depression.  8. History of overactive bladder.  9. History of hiatal hernia.   PLAN:  Will admit the patient on telemetry bed under [facility]  with a diagnosis of lower GI bleed and diverticulosis.  The patient will  be DNR as per her wish.  The patient will be on clear liquid diet which  will be progressed to full liquid diet as she improves and tolerates  blood transfusion products.  Will check vitals, input/ output every  eight hours.  Will check CBC with differential in the a.m.  Will type  and cross for 2 units of PRBC and then transfuse with premedication of  25 mg on p.o. Benadryl and 500 mg of p.o. Tylenol one dose prior to  transfusion.  Will provide IV normal saline at 50 mL per hour for 1  liter after transfusion.  Will provide p.o. Tylenol, IV morphine, p.o.  Vicodin, IV Zofran p.r.n. for fever, pain, and nausea respectively.  Will start IV Nexium 40 mg  for 24 hours and SCDs on leg for GI and DVT prophylaxis respectively.  The patient is stable.  No further workup needed at this time.  The  patient may need to have her hemoglobin checked once after discharge to  make sure that her hemoglobin is stable by the primary care.      Donalynn Furlong, MD  Electronically Signed     TVP/MEDQ  D:  02/24/2009  T:  02/25/2009  Job:  [id number]   cc:   Miguel Aschoff, M.D.

## 2010-12-13 NOTE — Op Note (Signed)
[name], [name]               ACCOUNT NO.:  [id number]   MEDICAL RECORD NO.:  [id number]          PATIENT TYPE:  AMB   LOCATION:  ENDO                         FACILITY:  [facility]   PHYSICIAN:  James L. Malon Kindle., M.D.DATE OF BIRTH:  27-Jun-1926   DATE OF PROCEDURE:  03/12/2006  DATE OF DISCHARGE:                                 OPERATIVE REPORT   PROCEDURE:  Colonoscopy.   MEDICATIONS:  Fentanyl 100 mcg, Versed 12.5 mg IV.   SCOPE:  Pediatric adjustable scope.   INDICATIONS:  A 77 year old woman who has had a history of severe  diverticular disease.  Had a colonoscopy attempted for positive stools back  in 1998 with adult scope at that time.  Due to severe diverticular disease,  we were unable to advance the scope and we thought she probably had a  strictured area.  We did order a virtual colonoscopy at this time, and the  virtual colonoscopy suggested possible small polyps in the descending colon  and showed severe diverticular disease.  Due to this finding, a colonoscopy  was attempted. The patient had explained to her the possibility that this  will be unsuccessful, the risks and benefits, the possibility of surgery,  the possibility that this may be a false finding, etc.   DESCRIPTION OF PROCEDURE:  The procedure was explained to the patient.  Consent obtained.  Left lateral decubitus position.  A digital exam was  performed.  The pediatric adjustable scope was used.  The prep was adequate.  There were still multiple balls of stool.  Patient had extensive  diverticular disease from the sigmoid up to the descending colon with a  narrowed area in the sigmoid.  Eventually we were able to get around this.  Using abdominal pressure and multiple position changes, we were able to pass  the ascending colon.  She had a long, tortuous colon.  Was having difficulty  advancing down into the cecum.  Although I could see the ascending colon, I  elected not to push this further.  The  scope was withdrawn.  The ascending  and  transverse colon were seen well with no polyps.  The descending colon  was carefully examined.  No polyps were seen, although there were several  balls of stool.  Extensive diverticular disease in the descending,  particularly distal descending colon and in the sigmoid colon.  No gross  polyps were seen.  The rectum was free of polyps.  The scope was withdrawn  and the patient was resting comfortably at the termination of the procedure.  There were no immediate complications.   ASSESSMENT:  1. Abnormal virtual colonoscopy with no polyps seen on the colonoscopy. It      may well be that the large number of balls of stool related to her      diverticular disease may have appeared to be polyps.  2. Severe extensive diverticulosis in the entire left colon.   PLAN:  Routine followup.  Would resume yearly Hemoccult in 5 years.  Will  see her back in my office in 3 months.  Given diverticulosis information  sheet and fiber.           ______________________________  [name] Malon Kindle., M.D.     Waldron Session  D:  03/12/2006  T:  03/12/2006  Job:  [id number]   cc:   C. Duane Lope, M.D.

## 2011-02-05 ENCOUNTER — Other Ambulatory Visit: Payer: Self-pay | Admitting: Oncology

## 2011-02-05 ENCOUNTER — Encounter (HOSPITAL_BASED_OUTPATIENT_CLINIC_OR_DEPARTMENT_OTHER): Payer: Medicare Other | Admitting: Oncology

## 2011-02-05 DIAGNOSIS — D649 Anemia, unspecified: Secondary | ICD-10-CM

## 2011-02-05 DIAGNOSIS — K5733 Diverticulitis of large intestine without perforation or abscess with bleeding: Secondary | ICD-10-CM

## 2011-02-05 DIAGNOSIS — K909 Intestinal malabsorption, unspecified: Secondary | ICD-10-CM

## 2011-02-05 LAB — CBC WITH DIFFERENTIAL/PLATELET
BASO%: 0.3 % (ref 0.0–2.0)
Eosinophils Absolute: 0.1 10*3/uL (ref 0.0–0.5)
MCH: 29.3 pg (ref 25.1–34.0)
MCV: 89.6 fL (ref 79.5–101.0)
MONO#: 1 10*3/uL — ABNORMAL HIGH (ref 0.1–0.9)
NEUT%: 80.3 % — ABNORMAL HIGH (ref 38.4–76.8)
lymph#: 0.9 10*3/uL (ref 0.9–3.3)

## 2011-04-22 LAB — BASIC METABOLIC PANEL
CO2: 25
Chloride: 97
GFR calc Af Amer: >60
Sodium: 131 — ABNORMAL LOW

## 2011-04-22 LAB — CBC
HCT: 24.3 — ABNORMAL LOW
Hemoglobin: 8.5 — ABNORMAL LOW
Hemoglobin: 8.8 — ABNORMAL LOW
MCHC: 34.5
MCHC: 34.9
MCV: 93.6
RBC: 2.59 — ABNORMAL LOW
RBC: 2.82 — ABNORMAL LOW

## 2011-04-22 LAB — TYPE AND SCREEN

## 2011-04-22 LAB — DIFFERENTIAL
Basophils Relative: 0
Eosinophils Absolute: 0
Eosinophils Relative: 0
Monocytes Absolute: 0.1
Monocytes Relative: 1 — ABNORMAL LOW

## 2011-04-22 LAB — ABO/RH: ABO/RH(D): AB POS

## 2011-04-29 LAB — CROSSMATCH
ABO/RH(D): AB POS
Antibody Screen: NEGATIVE

## 2011-04-29 LAB — COMPREHENSIVE METABOLIC PANEL
BUN: 32 mg/dL — ABNORMAL HIGH (ref 6–23)
CO2: 24 mEq/L (ref 19–32)
Calcium: 8.2 mg/dL — ABNORMAL LOW (ref 8.4–10.5)
Creatinine, Ser: 0.58 mg/dL (ref 0.4–1.2)
GFR calc Af Amer: >60 mL/min (ref >60)
GFR calc non Af Amer: >60 mL/min (ref >60)
Glucose, Bld: 160 mg/dL — ABNORMAL HIGH (ref 70–99)

## 2011-04-29 LAB — BASIC METABOLIC PANEL
BUN: 22 mg/dL (ref 6–23)
CO2: 26 mEq/L (ref 19–32)
Calcium: 7.8 mg/dL — ABNORMAL LOW (ref 8.4–10.5)
Creatinine, Ser: 0.48 mg/dL (ref 0.4–1.2)
GFR calc Af Amer: >60 mL/min (ref >60)

## 2011-04-29 LAB — DIFFERENTIAL
Lymphocytes Relative: 6 % — ABNORMAL LOW (ref 12–46)
Lymphs Abs: 0.5 10*3/uL — ABNORMAL LOW (ref 0.7–4.0)
Neutro Abs: 7.6 10*3/uL (ref 1.7–7.7)
Neutrophils Relative %: 92 % — ABNORMAL HIGH (ref 43–77)

## 2011-04-29 LAB — PROTIME-INR
INR: 1.2 (ref 0.00–1.49)
Prothrombin Time: 15.2 seconds (ref 11.6–15.2)

## 2011-04-29 LAB — CBC
MCHC: 33.1 g/dL (ref 30.0–36.0)
MCV: 96.7 fL (ref 78.0–100.0)
RBC: 1.73 MIL/uL — ABNORMAL LOW (ref 3.87–5.11)

## 2011-04-29 LAB — HEMOGLOBIN AND HEMATOCRIT, BLOOD
HCT: 27.7 % — ABNORMAL LOW (ref 36.0–46.0)
Hemoglobin: 6.8 g/dL — CL (ref 12.0–15.0)
Hemoglobin: 9.4 g/dL — ABNORMAL LOW (ref 12.0–15.0)

## 2011-05-02 LAB — BASIC METABOLIC PANEL
BUN: 8 mg/dL (ref 6–23)
Chloride: 102 mEq/L (ref 96–112)
GFR calc non Af Amer: >60 mL/min (ref >60)
Potassium: 2.8 mEq/L — ABNORMAL LOW (ref 3.5–5.1)
Sodium: 136 mEq/L (ref 135–145)

## 2011-05-02 LAB — CBC
HCT: 28.1 % — ABNORMAL LOW (ref 36.0–46.0)
Hemoglobin: 9.5 g/dL — ABNORMAL LOW (ref 12.0–15.0)
MCV: 91.5 fL (ref 78.0–100.0)
Platelets: 226 10*3/uL (ref 150–400)
RBC: 3.08 MIL/uL — ABNORMAL LOW (ref 3.87–5.11)
WBC: 8 10*3/uL (ref 4.0–10.5)

## 2011-05-02 LAB — HEMOGLOBIN AND HEMATOCRIT, BLOOD: HCT: 33.4 % — ABNORMAL LOW (ref 36.0–46.0)

## 2011-05-28 ENCOUNTER — Other Ambulatory Visit: Payer: Self-pay | Admitting: Oncology

## 2011-05-28 ENCOUNTER — Encounter (HOSPITAL_BASED_OUTPATIENT_CLINIC_OR_DEPARTMENT_OTHER): Payer: Medicare Other | Admitting: Oncology

## 2011-05-28 DIAGNOSIS — K909 Intestinal malabsorption, unspecified: Secondary | ICD-10-CM

## 2011-05-28 DIAGNOSIS — K5733 Diverticulitis of large intestine without perforation or abscess with bleeding: Secondary | ICD-10-CM

## 2011-05-28 DIAGNOSIS — D649 Anemia, unspecified: Secondary | ICD-10-CM

## 2011-05-28 LAB — CBC WITH DIFFERENTIAL/PLATELET
BASO%: 0.4 % (ref 0.0–2.0)
Eosinophils Absolute: 0.1 10*3/uL (ref 0.0–0.5)
HCT: 35.2 % (ref 34.8–46.6)
HGB: 11.5 g/dL — ABNORMAL LOW (ref 11.6–15.9)
LYMPH%: 10.8 % — ABNORMAL LOW (ref 14.0–49.7)
MONO#: 1.1 10*3/uL — ABNORMAL HIGH (ref 0.1–0.9)
NEUT#: 8.6 10*3/uL — ABNORMAL HIGH (ref 1.5–6.5)
NEUT%: 78.2 % — ABNORMAL HIGH (ref 38.4–76.8)
Platelets: 481 10*3/uL — ABNORMAL HIGH (ref 145–400)
WBC: 11.1 10*3/uL — ABNORMAL HIGH (ref 3.9–10.3)
lymph#: 1.2 10*3/uL (ref 0.9–3.3)

## 2011-06-02 NOTE — Progress Notes (Unsigned)
Pt notified by phone per Dr Reece Agar, "labs stable."  dph

## 2011-06-30 ENCOUNTER — Inpatient Hospital Stay (HOSPITAL_COMMUNITY)
Admission: EM | Admit: 2011-06-30 | Discharge: 2011-07-03 | DRG: 392 | Disposition: A | Discharge status: Home / Self Care | Payer: Medicare Other | Attending: Internal Medicine | Admitting: Internal Medicine

## 2011-06-30 ENCOUNTER — Encounter: Payer: Self-pay | Admitting: Emergency Medicine

## 2011-06-30 DIAGNOSIS — K5792 Diverticulitis of intestine, part unspecified, without perforation or abscess without bleeding: Secondary | ICD-10-CM | POA: Diagnosis present

## 2011-06-30 DIAGNOSIS — K5732 Diverticulitis of large intestine without perforation or abscess without bleeding: Principal | ICD-10-CM | POA: Diagnosis present

## 2011-06-30 DIAGNOSIS — E039 Hypothyroidism, unspecified: Secondary | ICD-10-CM | POA: Diagnosis present

## 2011-06-30 DIAGNOSIS — H9190 Unspecified hearing loss, unspecified ear: Secondary | ICD-10-CM | POA: Diagnosis present

## 2011-06-30 DIAGNOSIS — K529 Noninfective gastroenteritis and colitis, unspecified: Secondary | ICD-10-CM | POA: Diagnosis present

## 2011-06-30 DIAGNOSIS — D649 Anemia, unspecified: Secondary | ICD-10-CM | POA: Diagnosis present

## 2011-06-30 DIAGNOSIS — E876 Hypokalemia: Secondary | ICD-10-CM | POA: Diagnosis present

## 2011-06-30 HISTORY — DX: Unspecified osteoarthritis, unspecified site: M19.90

## 2011-06-30 HISTORY — DX: Fibromyalgia: M79.7

## 2011-06-30 HISTORY — DX: Diverticulitis of intestine, part unspecified, without perforation or abscess without bleeding: K57.92

## 2011-06-30 HISTORY — DX: Diverticulosis of intestine, part unspecified, without perforation or abscess without bleeding: K57.90

## 2011-06-30 HISTORY — DX: Hypothyroidism, unspecified: E03.9

## 2011-06-30 NOTE — ED Notes (Signed)
Pt presented to ER by EMS, pt been seen at the PCP office, [facility], where after taking Xrays, pt was Dx with vowel obstruction. Pt is c/o abd cramps, states s/s lasting for 3 weeks now, also states when BM noted minimal blood mixes in with the stool, red blood and them dark stool noted there after. Upon assessment of lower abd quadrant pt grimacing, abd firm to palpation. Hypoactive sounds noted in all quadrants.

## 2011-07-01 ENCOUNTER — Emergency Department (HOSPITAL_COMMUNITY): Payer: Medicare Other

## 2011-07-01 ENCOUNTER — Encounter (HOSPITAL_COMMUNITY): Payer: Self-pay | Admitting: Family Medicine

## 2011-07-01 DIAGNOSIS — K529 Noninfective gastroenteritis and colitis, unspecified: Secondary | ICD-10-CM | POA: Diagnosis present

## 2011-07-01 DIAGNOSIS — H9190 Unspecified hearing loss, unspecified ear: Secondary | ICD-10-CM | POA: Diagnosis not present

## 2011-07-01 DIAGNOSIS — K5792 Diverticulitis of intestine, part unspecified, without perforation or abscess without bleeding: Secondary | ICD-10-CM

## 2011-07-01 DIAGNOSIS — E039 Hypothyroidism, unspecified: Secondary | ICD-10-CM | POA: Diagnosis not present

## 2011-07-01 HISTORY — DX: Diverticulitis of intestine, part unspecified, without perforation or abscess without bleeding: K57.92

## 2011-07-01 LAB — MAGNESIUM: Magnesium: 1.9 mg/dL (ref 1.5–2.5)

## 2011-07-01 LAB — COMPREHENSIVE METABOLIC PANEL
ALT: 10 U/L (ref 0–35)
Alkaline Phosphatase: 76 U/L (ref 39–117)
GFR calc Af Amer: >90 mL/min (ref >90)
Glucose, Bld: 113 mg/dL — ABNORMAL HIGH (ref 70–99)
Potassium: 2.4 mEq/L — CL (ref 3.5–5.1)
Sodium: 132 mEq/L — ABNORMAL LOW (ref 135–145)
Total Protein: 7.5 g/dL (ref 6.0–8.3)

## 2011-07-01 LAB — DIFFERENTIAL
Eosinophils Absolute: 0.1 10*3/uL (ref 0.0–0.7)
Lymphocytes Relative: 12 % (ref 12–46)
Lymphs Abs: 1 10*3/uL (ref 0.7–4.0)
Neutrophils Relative %: 76 % (ref 43–77)

## 2011-07-01 LAB — CBC
Hemoglobin: 11.3 g/dL — ABNORMAL LOW (ref 12.0–15.0)
MCH: 29.6 pg (ref 26.0–34.0)
Platelets: 553 10*3/uL — ABNORMAL HIGH (ref 150–400)

## 2011-07-01 LAB — URINALYSIS, ROUTINE W REFLEX MICROSCOPIC
Glucose, UA: NEGATIVE mg/dL
Ketones, ur: 15 mg/dL — AB
pH: 6 (ref 5.0–8.0)

## 2011-07-01 LAB — URINE MICROSCOPIC-ADD ON

## 2011-07-01 MED ORDER — ONDANSETRON HCL 4 MG PO TABS: 4.0000 mg | ORAL_TABLET | Freq: Four times a day (QID) | ORAL | Status: DC | PRN

## 2011-07-01 MED ORDER — OXYBUTYNIN 3.9 MG/24HR TD PTTW
1.0000 | MEDICATED_PATCH | TRANSDERMAL | Status: DC
  Administered 2011-07-01: 1 via TRANSDERMAL
  Filled 2011-07-01: qty 1

## 2011-07-01 MED ORDER — MORPHINE SULFATE 2 MG/ML IJ SOLN: 1.0000 mg | INTRAMUSCULAR | Status: DC | PRN

## 2011-07-01 MED ORDER — ONDANSETRON HCL 4 MG/2ML IJ SOLN: 4.0000 mg | Freq: Four times a day (QID) | INTRAMUSCULAR | Status: DC | PRN

## 2011-07-01 MED ORDER — CIPROFLOXACIN IN D5W 400 MG/200ML IV SOLN
400.0000 mg | Freq: Once | INTRAVENOUS | Status: AC
  Administered 2011-07-01: 400 mg via INTRAVENOUS
  Filled 2011-07-01: qty 200

## 2011-07-01 MED ORDER — LEVOTHYROXINE SODIUM 112 MCG PO TABS
112.0000 ug | ORAL_TABLET | Freq: Every day | ORAL | Status: DC
  Administered 2011-07-01 – 2011-07-03 (×3): 112 ug via ORAL
  Filled 2011-07-01 (×3): qty 1

## 2011-07-01 MED ORDER — DEXTROSE 5 % IV SOLN
1.0000 g | Freq: Once | INTRAVENOUS | Status: AC
  Administered 2011-07-01: 1 g via INTRAVENOUS
  Filled 2011-07-01: qty 10

## 2011-07-01 MED ORDER — POTASSIUM CHLORIDE CRYS ER 20 MEQ PO TBCR
40.0000 meq | EXTENDED_RELEASE_TABLET | Freq: Once | ORAL | Status: AC
  Administered 2011-07-01: 40 meq via ORAL
  Filled 2011-07-01: qty 2

## 2011-07-01 MED ORDER — PANTOPRAZOLE SODIUM 40 MG PO TBEC
40.0000 mg | DELAYED_RELEASE_TABLET | Freq: Every day | ORAL | Status: DC
  Administered 2011-07-01 – 2011-07-03 (×3): 40 mg via ORAL
  Filled 2011-07-01 (×4): qty 1

## 2011-07-01 MED ORDER — HYDROCODONE-ACETAMINOPHEN 5-325 MG PO TABS
1.0000 | ORAL_TABLET | ORAL | Status: DC | PRN
  Administered 2011-07-01 – 2011-07-02 (×2): 1 via ORAL
  Filled 2011-07-01 (×2): qty 1

## 2011-07-01 MED ORDER — POTASSIUM CHLORIDE IN NACL 20-0.9 MEQ/L-% IV SOLN
INTRAVENOUS | Status: DC
  Administered 2011-07-01 – 2011-07-02 (×2): via INTRAVENOUS
  Filled 2011-07-01 (×5): qty 1000

## 2011-07-01 MED ORDER — MORPHINE SULFATE 2 MG/ML IJ SOLN
2.0000 mg | Freq: Once | INTRAMUSCULAR | Status: AC
  Administered 2011-07-01: 2 mg via INTRAVENOUS
  Filled 2011-07-01: qty 1

## 2011-07-01 MED ORDER — METRONIDAZOLE IN NACL 5-0.79 MG/ML-% IV SOLN
500.0000 mg | Freq: Once | INTRAVENOUS | Status: AC
  Administered 2011-07-01: 500 mg via INTRAVENOUS
  Filled 2011-07-01: qty 100

## 2011-07-01 MED ORDER — ONDANSETRON HCL 4 MG/2ML IJ SOLN
4.0000 mg | Freq: Once | INTRAMUSCULAR | Status: AC
  Administered 2011-07-01: 4 mg via INTRAVENOUS
  Filled 2011-07-01: qty 2

## 2011-07-01 MED ORDER — METRONIDAZOLE IN NACL 5-0.79 MG/ML-% IV SOLN
500.0000 mg | Freq: Three times a day (TID) | INTRAVENOUS | Status: DC
  Administered 2011-07-01 – 2011-07-03 (×6): 500 mg via INTRAVENOUS
  Filled 2011-07-01 (×9): qty 100

## 2011-07-01 MED ORDER — CIPROFLOXACIN IN D5W 400 MG/200ML IV SOLN: 400.0000 mg | INTRAVENOUS | Status: DC

## 2011-07-01 MED ORDER — ENOXAPARIN SODIUM 40 MG/0.4ML ~~LOC~~ SOLN
40.0000 mg | SUBCUTANEOUS | Status: DC
  Administered 2011-07-01 – 2011-07-02 (×2): 40 mg via SUBCUTANEOUS
  Filled 2011-07-01 (×4): qty 0.4

## 2011-07-01 MED ORDER — ACETAMINOPHEN 650 MG RE SUPP: 650.0000 mg | Freq: Four times a day (QID) | RECTAL | Status: DC | PRN

## 2011-07-01 MED ORDER — SODIUM CHLORIDE 0.9 % IV SOLN
Freq: Once | INTRAVENOUS | Status: AC
  Administered 2011-07-01: 02:00:00 via INTRAVENOUS

## 2011-07-01 MED ORDER — ALUM & MAG HYDROXIDE-SIMETH 200-200-20 MG/5ML PO SUSP: 30.0000 mL | Freq: Four times a day (QID) | ORAL | Status: DC | PRN

## 2011-07-01 MED ORDER — CIPROFLOXACIN IN D5W 400 MG/200ML IV SOLN
400.0000 mg | Freq: Two times a day (BID) | INTRAVENOUS | Status: DC
  Administered 2011-07-01 – 2011-07-03 (×4): 400 mg via INTRAVENOUS
  Filled 2011-07-01 (×7): qty 200

## 2011-07-01 MED ORDER — ACETAMINOPHEN 325 MG PO TABS: 650.0000 mg | ORAL_TABLET | Freq: Four times a day (QID) | ORAL | Status: DC | PRN

## 2011-07-01 MED ORDER — SODIUM CHLORIDE 0.9 % IV SOLN: INTRAVENOUS | Status: DC

## 2011-07-01 NOTE — Progress Notes (Signed)
Patient ID: [name], female   DOB: 03/04/1926, 77 y.o.   MRN: [id number] Subjective: No events overnight.   Objective:  Vital signs in last 24 hours:  Filed Vitals:   06/30/11 2217 07/01/11 0438 07/01/11 0627 07/01/11 0846  BP: 171/84 156/75 124/80 114/90  Pulse: 89 95 83 76  Temp: 98.1 F (36.7 C) 98.5 F (36.9 C)  98.7 F (37.1 C)  TempSrc: Oral Oral  Oral  Resp: 18 17  20   SpO2: 97% 99% 96% 96%    Intake/Output from previous day:   Intake/Output Summary (Last 24 hours) at 07/01/11 1245 Last data filed at 07/01/11 0824  Gross per 24 hour  Intake      0 ml  Output    405 ml  Net   -405 ml    Physical Exam: General: Alert, awake, oriented x3, in no acute distress. HEENT: No bruits, no goiter. Moist mucous membranes, no scleral icterus, no conjunctival pallor. Heart: Regular rate and rhythm, without murmurs, rubs, gallops. Lungs: Clear to auscultation bilaterally. No wheezing, no rhonchi, no rales.  Abdomen: Soft, nontender, nondistended, positive bowel sounds. Extremities: No clubbing cyanosis or edema,  positive pedal pulses. Neuro: Grossly intact, nonfocal.    Lab Results:  Basic Metabolic Panel:    Component Value Date/Time   NA 132* 07/01/2011 0159   K 2.4* 07/01/2011 0159   CL 92* 07/01/2011 0159   CO2 26 07/01/2011 0159   BUN 23 07/01/2011 0159   CREATININE 0.66 07/01/2011 0159   GLUCOSE 113* 07/01/2011 0159   CALCIUM 9.8 07/01/2011 0159   CBC:    Component Value Date/Time   WBC 8.1 07/01/2011 0159   WBC 11.1* 05/28/2011 1302   HGB 11.3* 07/01/2011 0159   HGB 11.5* 05/28/2011 1302   HCT 33.9* 07/01/2011 0159   HCT 35.2 05/28/2011 1302   PLT 553* 07/01/2011 0159   PLT 481* 05/28/2011 1302   MCV 88.7 07/01/2011 0159   MCV 91.2 05/28/2011 1302   NEUTROABS 6.1 07/01/2011 0159   NEUTROABS 8.6* 05/28/2011 1302   LYMPHSABS 1.0 07/01/2011 0159   LYMPHSABS 1.2 05/28/2011 1302   MONOABS 0.9 07/01/2011 0159   MONOABS 1.1* 05/28/2011 1302   EOSABS 0.1  07/01/2011 0159   EOSABS 0.1 05/28/2011 1302   BASOSABS 0.0 07/01/2011 0159   BASOSABS 0.0 05/28/2011 1302      Lab 07/01/11 0159  WBC 8.1  HGB 11.3*  HCT 33.9*  PLT 553*  MCV 88.7  MCH 29.6  MCHC 33.3  RDW 13.4  LYMPHSABS 1.0  MONOABS 0.9  EOSABS 0.1  BASOSABS 0.0  BANDABS --    Lab 07/01/11 0627 07/01/11 0159  NA -- 132*  K -- 2.4*  CL -- 92*  CO2 -- 26  GLUCOSE -- 113*  BUN -- 23  CREATININE -- 0.66  CALCIUM -- 9.8  MG 1.9 --   No results found for this basename: INR:5,PROTIME:5 in the last 168 hours Cardiac markers: No results found for this basename: CK:3,CKMB:3,TROPONINI:3,MYOGLOBIN:3 in the last 168 hours No results found for this basename: POCBNP:3 in the last 168 hours No results found for this or any previous visit (from the past 240 hour(s)).  Studies/Results: Ct Abdomen Pelvis W Contrast  07/01/2011    IMPRESSION:  1.  Suspect mild acute diverticulitis at the distal sigmoid colon, with mild colitis extending more proximally along the sigmoid colon.  There is gradual decompression along the descending colon; diffuse distension of the cecum, ascending colon and transverse colon  noted.  No definite evidence for mass.  No evidence for perforation or abscess formation. 2. Mild dilatation of the intrahepatic biliary ducts may be borderline normal, given apparent mild compression of the gallbladder against the diaphragm anterior to the liver. This positioning of the gallbladder is new from 2010; surrounding fluid is nonspecific, as it could reflect trace ascites.  No evidence for distal obstruction. 3. Scattered diverticulosis along the entirety of the colon. 4.  Bilateral renal cysts noted. 5.  Irregular appearance to the bladder likely reflects compression by the colon; associated distension of the urethra. 6.  Mild coronary artery calcifications seen. 7.  Mild bibasilar atelectasis noted. 8.  Degenerative change noted along the lumbar spine, with grade 1  anterolisthesis of L3 on L4 and grade 1 anterolisthesis of L4 on L5. 9.  Small to moderate hiatal hernia noted.    Medications: Scheduled Meds:   . sodium chloride   Intravenous Once  . cefTRIAXone (ROCEPHIN)  IV  1 g Intravenous Once  . ciprofloxacin  400 mg Intravenous Once  . enoxaparin  40 mg Subcutaneous Q24H  . metronidazole  500 mg Intravenous Once  . metronidazole  500 mg Intravenous Q8H  .  morphine injection  2 mg Intravenous Once  . ondansetron  4 mg Intravenous Once  . pantoprazole  40 mg Oral Daily  . potassium chloride  40 mEq Oral Once   Continuous Infusions:   . sodium chloride    . 0.9 % NaCl with KCl 20 mEq / L     PRN Meds:.  Assessment/Plan:  Principal Problem:  *Abdominal pain - likely secondary to diverticulitis, continue Cipro and Flagyl for now; pain relief with morphine  Active Problems: Hypokalemia - replete GERD - continue Protonix    LOS: 1 day   Jacinta Penalver 07/01/2011, 12:45 PM

## 2011-07-01 NOTE — ED Provider Notes (Signed)
History     CSN: [id number] Arrival date & time: 06/30/2011 10:14 PM   First MD Initiated Contact with Patient 07/01/11 0121      Chief Complaint  Patient presents with  . Constipation    Dx with bowel obstructionas PCP office    (Consider location/radiation/quality/duration/timing/severity/associated sxs/prior treatment) Patient is a 77 y.o. female presenting with constipation. The history is provided by the patient.  Constipation  Episode onset: 3 Weeks ago. The onset was gradual. The problem occurs continuously. The problem has been gradually worsening. The pain is moderate. Stool description: Typically has small hard bowel movements. Treatments tried: History of multiple prior surgeries and also a history of small bowel obstruction. Ineffective treatments: Has been taking MiraLAX, no BM. Associated symptoms include abdominal pain. Pertinent negatives include no fever, no diarrhea, no vomiting, no chest pain, no headaches and no rash. Urine output has been normal. The last void occurred less than 6 hours ago. Her past medical history is significant for abdominal surgery. There were no sick contacts. Recently, medical care has been given by the PCP.   had x-ray done by PCP today and was sent here to see gastroenterology as well as hematology for chronic anemia and to be admitted for possible small bowel obstruction. Patient has sharp pain located all over and. It does not radiate. No vomiting. No fevers. History of same with small bowel obstruction in the past.  Past Medical History  Diagnosis Date  . Diverticulitis   . Arthritis   . Fibromyalgia   . Diverticulosis   . Hypothyroidism     Past Surgical History  Procedure Date  . Ectopic pregnancy surgery   . Abdominal hysterectomy   . Abdominal adhesion surgery   . Knee arthroplasty     History reviewed. No pertinent family history.  History  Substance Use Topics  . Smoking status: Never Smoker   . Smokeless tobacco: Not on  file  . Alcohol Use: No    OB History    Grav Para Term Preterm Abortions TAB SAB Ect Mult Living                  Review of Systems  Constitutional: Negative for fever and chills.  HENT: Negative for neck pain and neck stiffness.   Eyes: Negative for pain.  Respiratory: Negative for shortness of breath.   Cardiovascular: Negative for chest pain.  Gastrointestinal: Positive for abdominal pain and constipation. Negative for vomiting and diarrhea.  Genitourinary: Negative for dysuria.  Musculoskeletal: Negative for back pain.  Skin: Negative for rash.  Neurological: Negative for headaches.  All other systems reviewed and are negative.    Allergies  Antihistamines, chlorpheniramine-type and Codeine  Home Medications   Current Outpatient Rx  Name Route Sig Dispense Refill  . FERROUS SULFATE 325 (65 FE) MG PO TABS Oral Take 325 mg by mouth 2 (two) times daily.      Marland Kitchen HYDROCODONE-ACETAMINOPHEN 5-500 MG PO TABS Oral Take 1 tablet by mouth every 6 (six) hours as needed. Pain       . LEVOTHYROXINE SODIUM 112 MCG PO TABS Oral Take 112 mcg by mouth daily.      Marland Kitchen NABUMETONE 750 MG PO TABS Oral Take 750 mg by mouth 2 (two) times daily.      . OXYBUTYNIN 3.9 MG/24HR TD PTTW Transdermal Place 1 patch onto the skin 2 (two) times a week.      Marland Kitchen PANTOPRAZOLE SODIUM 40 MG PO TBEC Oral Take 40 mg by  mouth daily.      Marland Kitchen POLYETHYLENE GLYCOL 3350 PO PACK Oral Take 17 g by mouth daily as needed. Constipation     . TRAMADOL HCL 50 MG PO TABS Oral Take 50-100 mg by mouth every 6 (six) hours as needed. Maximum dose= 8 tablets per day       BP 171/84  Pulse 89  Temp(Src) 98.1 F (36.7 C) (Oral)  Resp 18  SpO2 97%  Physical Exam  Constitutional: She is oriented to person, place, and time. She appears well-developed and well-nourished.  HENT:  Head: Normocephalic and atraumatic.  Eyes: Conjunctivae and EOM are normal. Pupils are equal, round, and reactive to light.  Neck: Trachea normal.  Neck supple. No thyromegaly present.  Cardiovascular: Normal rate, regular rhythm, S1 normal, S2 normal and normal pulses.     No systolic murmur is present   No diastolic murmur is present  Pulses:      Radial pulses are 2+ on the right side, and 2+ on the left side.  Pulmonary/Chest: Effort normal and breath sounds normal. She has no wheezes. She has no rhonchi. She has no rales. She exhibits no tenderness.  Abdominal: Normal appearance. There is no CVA tenderness and negative Murphy's sign.       Abdomen is round and distended with diffuse mild tenderness and decreased bowel sounds  Musculoskeletal:       BLE:s Calves nontender, no cords or erythema, negative Homans sign  Neurological: She is alert and oriented to person, place, and time. She has normal strength. No cranial nerve deficit or sensory deficit. GCS eye subscore is 4. GCS verbal subscore is 5. GCS motor subscore is 6.  Skin: Skin is warm and dry. No rash noted. She is not diaphoretic.  Psychiatric: Her speech is normal.       Cooperative and appropriate    ED Course  Procedures (including critical care time)  Results for orders placed during the hospital encounter of 06/30/11  CBC      Component Value Range   WBC 8.1  4.0 - 10.5 (K/uL)   RBC 3.82 (*) 3.87 - 5.11 (MIL/uL)   Hemoglobin 11.3 (*) 12.0 - 15.0 (g/dL)   HCT 16.1 (*) 09.6 - 46.0 (%)   MCV 88.7  78.0 - 100.0 (fL)   MCH 29.6  26.0 - 34.0 (pg)   MCHC 33.3  30.0 - 36.0 (g/dL)   RDW 04.5  40.9 - 81.1 (%)   Platelets 553 (*) 150 - 400 (K/uL)  DIFFERENTIAL      Component Value Range   Neutrophils Relative 76  43 - 77 (%)   Neutro Abs 6.1  1.7 - 7.7 (K/uL)   Lymphocytes Relative 12  12 - 46 (%)   Lymphs Abs 1.0  0.7 - 4.0 (K/uL)   Monocytes Relative 11  3 - 12 (%)   Monocytes Absolute 0.9  0.1 - 1.0 (K/uL)   Eosinophils Relative 1  0 - 5 (%)   Eosinophils Absolute 0.1  0.0 - 0.7 (K/uL)   Basophils Relative 0  0 - 1 (%)   Basophils Absolute 0.0  0.0 - 0.1  (K/uL)  COMPREHENSIVE METABOLIC PANEL      Component Value Range   Sodium 132 (*) 135 - 145 (mEq/L)   Potassium 2.4 (*) 3.5 - 5.1 (mEq/L)   Chloride 92 (*) 96 - 112 (mEq/L)   CO2 26  19 - 32 (mEq/L)   Glucose, Bld 113 (*) 70 - 99 (mg/dL)  BUN 23  6 - 23 (mg/dL)   Creatinine, Ser 0.45  0.50 - 1.10 (mg/dL)   Calcium 9.8  8.4 - 40.9 (mg/dL)   Total Protein 7.5  6.0 - 8.3 (g/dL)   Albumin 3.7  3.5 - 5.2 (g/dL)   AST 22  0 - 37 (U/L)   ALT 10  0 - 35 (U/L)   Alkaline Phosphatase 76  39 - 117 (U/L)   Total Bilirubin 0.4  0.3 - 1.2 (mg/dL)   GFR calc non Af Amer 79 (*) >90 (mL/min)   GFR calc Af Amer >90  >90 (mL/min)  URINALYSIS, ROUTINE W REFLEX MICROSCOPIC      Component Value Range   Color, Urine AMBER (*) YELLOW    APPearance TURBID (*) CLEAR    Specific Gravity, Urine 1.027  1.005 - 1.030    pH 6.0  5.0 - 8.0    Glucose, UA NEGATIVE  NEGATIVE (mg/dL)   Hgb urine dipstick LARGE (*) NEGATIVE    Bilirubin Urine MODERATE (*) NEGATIVE    Ketones, ur 15 (*) NEGATIVE (mg/dL)   Protein, ur 30 (*) NEGATIVE (mg/dL)   Urobilinogen, UA 1.0  0.0 - 1.0 (mg/dL)   Nitrite NEGATIVE  NEGATIVE    Leukocytes, UA LARGE (*) NEGATIVE   URINE MICROSCOPIC-ADD ON      Component Value Range   Squamous Epithelial / LPF FEW (*) RARE    WBC, UA TOO NUMEROUS TO COUNT  <3 (WBC/hpf)   RBC / HPF FIELD OBSCURED BY WBC'S  <3 (RBC/hpf)   Bacteria, UA FEW (*) RARE    Labs reviewed as above  CT scan obtained and reviewed with RAD, has diverticulitis.     MDM   ABD pain r/o SBO. Has diverticulitis on CT scan, plan MED admit. IV ABx initiated. Also has UTI. U Cx sent        Sunnie Nielsen, MD 07/01/11 [phone]

## 2011-07-01 NOTE — H&P (Signed)
PCP:   Miguel Aschoff, MD   Chief Complaint:  Abdominal pain  HPI: This is a 77 year old generally healthy female who has known history of diverticulosis but has never had diverticulitis, she states 3 weeks ago she large hard bowel movement. Her stool containing bright red blood, she has had no recurrent bleed since. She however has continued to have severe abdominal pain bilateral lower quadrants radiating upwards. She states her abdomen has been distended since and this is unusual for her. She cannot describe the pain she states is just painful, 10 out of 10 at its worse. She reports no fevers or chills no nausea, no vomiting. Her last colonoscopy was approximately a year ago. She does have a history of small bowel obstruction. She finally came to ER today because her abdominal pain has been persisting. She states food doesn't affect the pain, however she's not been eating much because of the pain. She reports no burning or urination, no confusion. History obtained from the patient was alert oriented. She currently is in no pain today she is receiving medications.  Review of Systems: Positives bolded The patient denies anorexia, fever, weight loss,, vision loss, decreased hearing, hoarseness, chest pain, syncope, dyspnea on exertion, peripheral edema, balance deficits, hemoptysis, abdominal pain, melena, hematochezia, severe indigestion/heartburn, hematuria, incontinence, genital sores, muscle weakness, suspicious skin lesions, transient blindness, difficulty walking, depression, unusual weight change, abnormal bleeding, enlarged lymph nodes, angioedema, and breast masses.  Past Medical History: Past Medical History  Diagnosis Date  . Diverticulitis   . Arthritis   . Fibromyalgia   . Diverticulosis   . Hypothyroidism    Past Surgical History  Procedure Date  . Ectopic pregnancy surgery   . Abdominal hysterectomy   . Abdominal adhesion surgery   . Knee arthroplasty     Medications: Prior  to Admission medications   Medication Sig Start Date End Date Taking? Authorizing Provider  ferrous sulfate 325 (65 FE) MG tablet Take 325 mg by mouth 2 (two) times daily.     Yes Historical Provider, MD  HYDROcodone-acetaminophen (VICODIN) 5-500 MG per tablet Take 1 tablet by mouth every 6 (six) hours as needed. Pain      Yes Historical Provider, MD  levothyroxine (SYNTHROID, LEVOTHROID) 112 MCG tablet Take 112 mcg by mouth daily.     Yes Historical Provider, MD  nabumetone (RELAFEN) 750 MG tablet Take 750 mg by mouth 2 (two) times daily.     Yes Historical Provider, MD  oxybutynin (OXYTROL) 3.9 MG/24HR Place 1 patch onto the skin 2 (two) times a week.     Yes Historical Provider, MD  pantoprazole (PROTONIX) 40 MG tablet Take 40 mg by mouth daily.     Yes Historical Provider, MD  polyethylene glycol (MIRALAX / GLYCOLAX) packet Take 17 g by mouth daily as needed. Constipation    Yes Historical Provider, MD  traMADol (ULTRAM) 50 MG tablet Take 50-100 mg by mouth every 6 (six) hours as needed. Maximum dose= 8 tablets per day    Yes Historical Provider, MD    Allergies:   Allergies  Allergen Reactions  . Antihistamines, Chlorpheniramine-Type   . Codeine     headache    Social History:  reports that she has quit smoking. She does not have any smokeless tobacco history on file. She reports that she does not drink alcohol or use illicit drugs.  Family History: Family History  Problem Relation Age of Onset  . Prostate cancer      Physical Exam: Filed Vitals:  06/30/11 1817 06/30/11 2217 07/01/11 0438  BP: 132/83 171/84 156/75  Pulse: 95 89 95  Temp: 98 F (36.7 C) 98.1 F (36.7 C) 98.5 F (36.9 C)  TempSrc: Oral Oral Oral  Resp: 18 18 17   SpO2: 96% 97% 99%    General:  Alert and oriented times three, well developed and nourished, no acute distress Eyes: PERRLA, pink conjunctiva, no scleral icterus ENT: Moist oral mucosa, neck supple, no thyromegaly Lungs: clear to  ascultation, no wheeze, no crackles, no use of accessory muscles Cardiovascular: regular rate and rhythm, no regurgitation, no gallops, no murmurs. No carotid bruits, no JVD Abdomen: soft, decreased BS, nonspecific tenderness to palpation, distended abdomen, no organomegaly, not an acute abdomen GU: not examined Neuro: CN II - XII grossly intact, sensation intact Musculoskeletal: strength 5/5 all extremities, no clubbing, cyanosis or edema Skin: no rash, no subcutaneous crepitation, no decubitus Psych: appropriate patient   Labs on Admission:   [facility] 07/01/11 0159  NA 132*  K 2.4*  CL 92*  CO2 26  GLUCOSE 113*  BUN 23  CREATININE 0.66  CALCIUM 9.8  MG --  PHOS --    Basename 07/01/11 0159  AST 22  ALT 10  ALKPHOS 76  BILITOT 0.4  PROT 7.5  ALBUMIN 3.7   No results found for this basename: LIPASE:2,AMYLASE:2 in the last 72 hours  Basename 07/01/11 0159  WBC 8.1  NEUTROABS 6.1  HGB 11.3*  HCT 33.9*  MCV 88.7  PLT 553*   No results found for this basename: CKTOTAL:3,CKMB:3,CKMBINDEX:3,TROPONINI:3 in the last 72 hours No results found for this basename: TSH,T4TOTAL,FREET3,T3FREE,THYROIDAB in the last 72 hours No results found for this basename: VITAMINB12:2,FOLATE:2,FERRITIN:2,TIBC:2,IRON:2,RETICCTPCT:2 in the last 72 hours  Radiological Exams on Admission: Ct Abdomen Pelvis W Contrast  07/01/2011  *RADIOLOGY REPORT*  Clinical Data: Constipation, with apparent colonic ileus on radiograph; concern for distal bowel obstruction.  CT ABDOMEN AND PELVIS WITH CONTRAST  Technique:  Multidetector CT imaging of the abdomen and pelvis was performed following the standard protocol during bolus administration of intravenous contrast.  Contrast:  100 mL of Omnipaque 300 IV contrast  Comparison: CT of the abdomen and pelvis performed 04/01/2009, and abdominal radiograph performed 06/30/2011  Findings: Mild bibasilar atelectasis is noted.  Mild coronary artery calcifications are  seen.  There is mild dilatation of the intrahepatic biliary ducts; this may be borderline normal, given apparent mild compression of the gallbladder against the diaphragm anterior to the liver.  This positioning of the gallbladder is new from 2010.  A small amount of surrounding fluid is nonspecific, as it could reflect trace ascites.  The common hepatic duct remains normal in caliber for the patient's age, measuring 0.8 cm in size.  The spleen is unremarkable in appearance.  Multiple bilateral renal cysts are seen, measuring up to 3.4 cm in size.  The kidneys are otherwise unremarkable in appearance.  There is no evidence of hydronephrosis.  No renal or ureteral stones are seen.  No significant perinephric stranding is appreciated.  The small bowel is largely filled with contrast; the distal small bowel remains normal in caliber, and is filled with fluid.  There is a small to moderate hiatal hernia; the stomach is partially filled with contrast and is otherwise unremarkable.  No acute vascular abnormalities are seen. Scattered calcification is noted along the abdominal aorta and its branches.  There is diffuse distension of the cecum and colon, measuring up to 9.5 cm along the ascending colon.  Gradual decompression is  noted along the descending colon.  Along the descending colon, note is made of increasing mild wall thickening.  At the proximal sigmoid colon, there is mild diffuse colonic wall thickening, and at the distal sigmoid colon, note is made of soft tissue inflammation in association with significant diverticulosis, raising concern for mild acute diverticulitis and mild colitis extending more proximally.  No significant free fluid is seen in conjunction with colonic loops.  The appearance suggests against a mass.  Scattered diverticulosis is noted along the entirety of the colon.  The bladder is mildly distended and somewhat irregular in appearance, likely reflecting compression by the colon.  There is  associated distension of the urethra. The patient is status post hysterectomy; no suspicious adnexal masses are seen.  No inguinal lymphadenopathy is seen.  No acute osseous abnormalities are identified.  There is diffuse narrowing of intervertebral disc spaces along the lumbar spine, with grade 1 anterolisthesis of L3 on L4 and grade 1 anterolisthesis of L4 on L5.  Associated vacuum phenomenon is seen. Diffuse facet disease is noted along the lumbar spine.  IMPRESSION:  1.  Suspect mild acute diverticulitis at the distal sigmoid colon, with mild colitis extending more proximally along the sigmoid colon.  There is gradual decompression along the descending colon; diffuse distension of the cecum, ascending colon and transverse colon noted.  No definite evidence for mass.  No evidence for perforation or abscess formation. 2. Mild dilatation of the intrahepatic biliary ducts may be borderline normal, given apparent mild compression of the gallbladder against the diaphragm anterior to the liver. This positioning of the gallbladder is new from 2010; surrounding fluid is nonspecific, as it could reflect trace ascites.  No evidence for distal obstruction. 3. Scattered diverticulosis along the entirety of the colon. 4.  Bilateral renal cysts noted. 5.  Irregular appearance to the bladder likely reflects compression by the colon; associated distension of the urethra. 6.  Mild coronary artery calcifications seen. 7.  Mild bibasilar atelectasis noted. 8.  Degenerative change noted along the lumbar spine, with grade 1 anterolisthesis of L3 on L4 and grade 1 anterolisthesis of L4 on L5. 9.  Small to moderate hiatal hernia noted.  Original Report Authenticated By: Tonia Ghent, M.D.    Assessment/Plan Present on Admission:  .Colitis .Abdominal pain Admit to MedSurg  Patient states pain has been present for 3 weeks, will treat with IV antibiotics, IV fluid hydration, n.p.o. As needed pain medications Patient states her  pain is improved with the pain medications Hypothyroidism History of diverticulosis Arthritis Stable resume home medications  Hypokalemia Check magnesium level and replete     DVT prophylaxis Full code  Team 1/Dr. Nelia Shi, Donney Caraveo 07/01/2011, 5:46 AM

## 2011-07-01 NOTE — Progress Notes (Signed)
ANTIBIOTIC CONSULT NOTE - INITIAL  Pharmacy Consult for Cipro  Indication: Probable colitis  Allergies  Allergen Reactions  . Antihistamines, Chlorpheniramine-Type   . Codeine     headache    Patient Measurements: Weight: 132 lb (59.875 kg)   Vital Signs: Temp: 98.7 F (37.1 C) (12/04 0846) Temp src: Oral (12/04 0846) BP: 114/90 mmHg (12/04 0846) Pulse Rate: 76  (12/04 0846)  Labs:  Basename 07/01/11 0159  WBC 8.1  HGB 11.3*  PLT 553*  LABCREA --  CREATININE 0.66   CrCl is unknown because there is no height on file for the current visit. No results found for this basename: VANCOTROUGH:2,VANCOPEAK:2,VANCORANDOM:2,GENTTROUGH:2,GENTPEAK:2,GENTRANDOM:2,TOBRATROUGH:2,TOBRAPEAK:2,TOBRARND:2,AMIKACINPEAK:2,AMIKACINTROU:2,AMIKACIN:2, in the last 72 hours   Microbiology: No results found for this or any previous visit (from the past 720 hour(s)).  Medical History: Past Medical History  Diagnosis Date  . Diverticulitis   . Arthritis   . Fibromyalgia   . Diverticulosis   . Hypothyroidism   . Osteoarthritis   . DJD (degenerative joint disease)     Medications:  Anti-infectives     Start     Dose/Rate Route Frequency Ordered Stop   07/01/11 1800   ciprofloxacin (CIPRO) IVPB 400 mg        400 mg 200 mL/hr over 60 Minutes Intravenous Every 12 hours 07/01/11 1338     07/01/11 1300   ciprofloxacin (CIPRO) IVPB 400 mg  Status:  Discontinued        400 mg 200 mL/hr over 60 Minutes Intravenous Every 24 hours 07/01/11 1248 07/01/11 1338   07/01/11 1245   metroNIDAZOLE (FLAGYL) IVPB 500 mg        500 mg 100 mL/hr over 60 Minutes Intravenous Every 8 hours 07/01/11 1243     07/01/11 0445   ciprofloxacin (CIPRO) IVPB 400 mg        400 mg 200 mL/hr over 60 Minutes Intravenous  Once 07/01/11 0442 07/01/11 0624   07/01/11 0445   metroNIDAZOLE (FLAGYL) IVPB 500 mg        500 mg 100 mL/hr over 60 Minutes Intravenous  Once 07/01/11 0442 07/01/11 0725   07/01/11 0300    cefTRIAXone (ROCEPHIN) 1 g in dextrose 5 % 50 mL IVPB        1 g 100 mL/hr over 30 Minutes Intravenous  Once 07/01/11 0246 07/01/11 1610         Assessment: 77 yo F w/hx diverticulosis, now with abd pain.  Flagyl 500mg  IV q8h, Renal fx normal, Cl about 50 ml/min normalized  Goal of Therapy:  Cipro dose/schedule appropriate for renal fx.   Plan:  Increase Cipro to 400 mg IV q12hr. Change to po abx when appropriate.  Chilton Si, Judith Campillo L 07/01/2011,1:39 PM

## 2011-07-02 DIAGNOSIS — E876 Hypokalemia: Secondary | ICD-10-CM | POA: Diagnosis present

## 2011-07-02 DIAGNOSIS — D649 Anemia, unspecified: Secondary | ICD-10-CM | POA: Diagnosis not present

## 2011-07-02 LAB — BASIC METABOLIC PANEL
CO2: 28 mEq/L (ref 19–32)
Calcium: 8.9 mg/dL (ref 8.4–10.5)
Creatinine, Ser: 0.52 mg/dL (ref 0.50–1.10)
GFR calc Af Amer: >90 mL/min (ref >90)
GFR calc non Af Amer: 86 mL/min — ABNORMAL LOW (ref >90)
Sodium: 135 mEq/L (ref 135–145)

## 2011-07-02 LAB — CBC
MCV: 89.9 fL (ref 78.0–100.0)
Platelets: 476 10*3/uL — ABNORMAL HIGH (ref 150–400)
RBC: 3.45 MIL/uL — ABNORMAL LOW (ref 3.87–5.11)
RDW: 13.5 % (ref 11.5–15.5)
WBC: 4.5 10*3/uL (ref 4.0–10.5)

## 2011-07-02 MED ORDER — SODIUM CHLORIDE 0.9 % IV SOLN: INTRAVENOUS | Status: DC

## 2011-07-02 NOTE — Progress Notes (Signed)
Physical Therapy Evaluation Patient Details Name: [name] MRN: [id number] DOB: 03-Mar-1926 Today's Date: 07/02/2011 Time: 9147-8295  Eval I Problem List:  Patient Active Problem List  Diagnoses  . Colitis  . Diverticulitis  . Hypothyroidism  . Hearing loss  . Normocytic anemia  . Hypokalemia    Past Medical History:  Past Medical History  Diagnosis Date  . Diverticulitis   . Arthritis   . Fibromyalgia   . Diverticulosis   . Hypothyroidism   . Osteoarthritis   . DJD (degenerative joint disease)    Past Surgical History:  Past Surgical History  Procedure Date  . Ectopic pregnancy surgery   . Abdominal hysterectomy   . Abdominal adhesion surgery   . Knee arthroplasty     PT Assessment/Plan/Recommendation PT Assessment Clinical Impression Statement: Pt presents with diagnosis of diverticulitis, colitis, abdomial pain. Performed 1x evaluation of pt-recommend home safety evaluation at discharge. Pt does not feel she needs PT in the acute setting or for follow-up. PT will sign off at this time.  PT Recommendation/Assessment: Patent does not need any further PT services No Skilled PT: Patient at baseline level of functioning (Per pt at baseline-pt denies need for PT services) PT Recommendation Recommendations for Other Services: OT consult Follow Up Recommendations:  Home Safety Evaluation Equipment Recommended: None recommended by PT PT Goals     PT Evaluation Precautions/Restrictions  Precautions Precautions: Fall Prior Functioning  Home Living Lives With: Alone Receives Help From: Friend(s) Type of Home: House Home Layout: One level Home Access: Stairs to enter Secretary/administrator of Steps: 2 Home Adaptive Equipment: Walker - rolling;Straight cane Prior Function Level of Independence: Requires assistive device for independence Driving: Yes Cognition Cognition Arousal/Alertness: Awake/alert Overall Cognitive Status: Appears within functional  limits for tasks assessed Sensation/Coordination   Extremity Assessment RLE Assessment RLE Assessment: Not tested (Strength > or = 4/5 with functional activity) LLE Assessment LLE Assessment: Not tested (Strength > or = 4/5 with functional activity) Mobility (including Balance) Bed Mobility Bed Mobility: Yes Supine to Sit: HOB elevated (Comment degrees);7: Independent Sit to Supine - Right: 7: Independent;HOB elevated (comment degrees) Transfers Transfers: Yes Sit to Stand: 6: Modified independent (Device/Increase time) Stand to Sit: 6: Modified independent (Device/Increase time) Ambulation/Gait Ambulation/Gait: Yes Ambulation/Gait Assistance: 5: Supervision Ambulation/Gait Assistance Details (indicate cue type and reason): VC safety. Increased lateral sway/"waddle" during walk.  Ambulation Distance (Feet): 125 Feet Assistive device: Rolling walker Stairs: Yes Stairs Assistance Details (indicate cue type and reason): Min-guard assist. Vcs safety, technique. Stair Management Technique: Sideways;One rail Right (2 handhold on 1 rail) Number of Stairs: 2   Posture/Postural Control Posture/Postural Control: No significant limitations Exercise    End of Session PT - End of Session Equipment Utilized During Treatment: Gait belt (RW) Activity Tolerance: Patient tolerated treatment well Patient left: in bed;with call bell in reach General Behavior During Session: [facility] for tasks performed (Pt prefers to perform tasks in her manner) Cognition: [facility] for tasks performed  Rebeca Alert [facility] 07/02/2011, 2:44 PM

## 2011-07-02 NOTE — Progress Notes (Addendum)
Subjective: She has no complaint at this time. She relates she's hungry. Her abdominal pain is gone. She relates she had a small bowel movement. Objective: Filed Vitals:   07/01/11 1640 07/01/11 1927 07/01/11 2040 07/02/11 0545  BP: 128/75 144/75 155/83 152/79  Pulse: 91 90 84 91  Temp: 98.8 F (37.1 C) 98.2 F (36.8 C) 98.3 F (36.8 C) 98.6 F (37 C)  TempSrc: Oral Oral Oral Oral  Resp: 18 18 18 18   Height:   5\' 2"  (1.575 m)   Weight:   63.5 kg (139 lb 15.9 oz)   SpO2: 95% 96% 97% 91%   Weight change:   Intake/Output Summary (Last 24 hours) at 07/02/11 1317 Last data filed at 07/02/11 1610  Gross per 24 hour  Intake      0 ml  Output    808 ml  Net   -808 ml    General: Alert, awake, oriented x3, in no acute distress.  HEENT: No bruits, no goiter.  Heart: Regular rate and rhythm, without murmurs, rubs, gallops.  Lungs: Good air movement clear to auscultation. Abdomen: Soft, nontender, nondistended, positive bowel sounds.  Neuro: Grossly intact, nonfocal.   Lab Results:  Basename 07/02/11 0525 07/01/11 0627 07/01/11 0159  NA 135 -- 132*  K 3.2* -- 2.4*  CL 99 -- 92*  CO2 28 -- 26  GLUCOSE 86 -- 113*  BUN 11 -- 23  CREATININE 0.52 -- 0.66  CALCIUM 8.9 -- 9.8  MG -- 1.9 --  PHOS -- -- --    Basename 07/01/11 0159  AST 22  ALT 10  ALKPHOS 76  BILITOT 0.4  PROT 7.5  ALBUMIN 3.7   No results found for this basename: LIPASE:2,AMYLASE:2 in the last 72 hours  Basename 07/02/11 0525 07/01/11 0159  WBC 4.5 8.1  NEUTROABS -- 6.1  HGB 10.0* 11.3*  HCT 31.0* 33.9*  MCV 89.9 88.7  PLT 476* 553*   No results found for this basename: CKTOTAL:3,CKMB:3,CKMBINDEX:3,TROPONINI:3 in the last 72 hours No results found for this basename: POCBNP:3 in the last 72 hours No results found for this basename: DDIMER:2 in the last 72 hours No results found for this basename: HGBA1C:2 in the last 72 hours No results found for this basename:  CHOL:2,HDL:2,LDLCALC:2,TRIG:2,CHOLHDL:2,LDLDIRECT:2 in the last 72 hours No results found for this basename: TSH,T4TOTAL,FREET3,T3FREE,THYROIDAB in the last 72 hours No results found for this basename: VITAMINB12:2,FOLATE:2,FERRITIN:2,TIBC:2,IRON:2,RETICCTPCT:2 in the last 72 hours  Micro Results: Recent Results (from the past 240 hour(s))  URINE CULTURE     Status: Normal (Preliminary result)   Collection Time   07/01/11  1:56 AM      Component Value Range Status Comment   Specimen Description URINE, RANDOM   Final    Special Requests NONE   Final    Setup Time [id number]   Final    Colony Count PENDING   Incomplete    Culture Culture reincubated for better growth   Final    Report Status PENDING   Incomplete     Studies/Results: Ct Abdomen Pelvis W Contrast  07/01/2011  *RADIOLOGY REPORT*  Clinical Data: Constipation, with apparent colonic ileus on radiograph; concern for distal bowel obstruction.  CT ABDOMEN AND PELVIS WITH CONTRAST  Technique:  Multidetector CT imaging of the abdomen and pelvis was performed following the standard protocol during bolus administration of intravenous contrast.  Contrast:  100 mL of Omnipaque 300 IV contrast  Comparison: CT of the abdomen and pelvis performed 04/01/2009, and abdominal radiograph performed  06/30/2011  Findings: Mild bibasilar atelectasis is noted.  Mild coronary artery calcifications are seen.  There is mild dilatation of the intrahepatic biliary ducts; this may be borderline normal, given apparent mild compression of the gallbladder against the diaphragm anterior to the liver.  This positioning of the gallbladder is new from 2010.  A small amount of surrounding fluid is nonspecific, as it could reflect trace ascites.  The common hepatic duct remains normal in caliber for the patient's age, measuring 0.8 cm in size.  The spleen is unremarkable in appearance.  Multiple bilateral renal cysts are seen, measuring up to 3.4 cm in size.  The kidneys  are otherwise unremarkable in appearance.  There is no evidence of hydronephrosis.  No renal or ureteral stones are seen.  No significant perinephric stranding is appreciated.  The small bowel is largely filled with contrast; the distal small bowel remains normal in caliber, and is filled with fluid.  There is a small to moderate hiatal hernia; the stomach is partially filled with contrast and is otherwise unremarkable.  No acute vascular abnormalities are seen. Scattered calcification is noted along the abdominal aorta and its branches.  There is diffuse distension of the cecum and colon, measuring up to 9.5 cm along the ascending colon.  Gradual decompression is noted along the descending colon.  Along the descending colon, note is made of increasing mild wall thickening.  At the proximal sigmoid colon, there is mild diffuse colonic wall thickening, and at the distal sigmoid colon, note is made of soft tissue inflammation in association with significant diverticulosis, raising concern for mild acute diverticulitis and mild colitis extending more proximally.  No significant free fluid is seen in conjunction with colonic loops.  The appearance suggests against a mass.  Scattered diverticulosis is noted along the entirety of the colon.  The bladder is mildly distended and somewhat irregular in appearance, likely reflecting compression by the colon.  There is associated distension of the urethra. The patient is status post hysterectomy; no suspicious adnexal masses are seen.  No inguinal lymphadenopathy is seen.  No acute osseous abnormalities are identified.  There is diffuse narrowing of intervertebral disc spaces along the lumbar spine, with grade 1 anterolisthesis of L3 on L4 and grade 1 anterolisthesis of L4 on L5.  Associated vacuum phenomenon is seen. Diffuse facet disease is noted along the lumbar spine.  IMPRESSION:  1.  Suspect mild acute diverticulitis at the distal sigmoid colon, with mild colitis  extending more proximally along the sigmoid colon.  There is gradual decompression along the descending colon; diffuse distension of the cecum, ascending colon and transverse colon noted.  No definite evidence for mass.  No evidence for perforation or abscess formation. 2. Mild dilatation of the intrahepatic biliary ducts may be borderline normal, given apparent mild compression of the gallbladder against the diaphragm anterior to the liver. This positioning of the gallbladder is new from 2010; surrounding fluid is nonspecific, as it could reflect trace ascites.  No evidence for distal obstruction. 3. Scattered diverticulosis along the entirety of the colon. 4.  Bilateral renal cysts noted. 5.  Irregular appearance to the bladder likely reflects compression by the colon; associated distension of the urethra. 6.  Mild coronary artery calcifications seen. 7.  Mild bibasilar atelectasis noted. 8.  Degenerative change noted along the lumbar spine, with grade 1 anterolisthesis of L3 on L4 and grade 1 anterolisthesis of L4 on L5. 9.  Small to moderate hiatal hernia noted.  Original Report Authenticated  By: JEFFREY Cherly Hensen, M.D.    Medications: I have reviewed the patient's current medications.   Principal Problem:  *Diverticulitis Active Problems:  Colitis  Hypothyroidism  Hearing loss  Normocytic anemia  Hypokalemia    Assessment and plan: -Acute diverticulitis. The patient relates her pain is much improved. She's currently on Cipro  and Flagyl started on 12/4/ 2012. She is hungry and like something to eat. So we'll give her full liquid diet.  -Hypokalemia replete and check a basic metabolic panel in the morning.  -If tolerates it can probably be DC tomorrow morning.   LOS: 2 days   Marinda Elk M.D. Pager: [phone] [facility] 07/02/2011, 1:17 PM

## 2011-07-03 LAB — URINE CULTURE: Culture  Setup Time: 201212040912

## 2011-07-03 MED ORDER — METRONIDAZOLE 500 MG PO TABS: 500.0000 mg | ORAL_TABLET | Freq: Three times a day (TID) | ORAL | Status: DC

## 2011-07-03 MED ORDER — CIPROFLOXACIN HCL 500 MG PO TABS: 500.0000 mg | ORAL_TABLET | Freq: Two times a day (BID) | ORAL | Status: DC

## 2011-07-03 MED ORDER — METRONIDAZOLE 500 MG PO TABS: 500.0000 mg | ORAL_TABLET | Freq: Three times a day (TID) | ORAL | Status: AC

## 2011-07-03 MED ORDER — ZOLPIDEM TARTRATE 5 MG PO TABS
5.0000 mg | ORAL_TABLET | Freq: Every evening | ORAL | Status: DC | PRN
  Administered 2011-07-03: 5 mg via ORAL
  Filled 2011-07-03: qty 1

## 2011-07-03 MED ORDER — CIPROFLOXACIN HCL 500 MG PO TABS: 500.0000 mg | ORAL_TABLET | Freq: Two times a day (BID) | ORAL | Status: AC

## 2011-07-03 NOTE — Discharge Summary (Signed)
Admit date: 06/30/2011 Discharge date: 07/03/2011  Primary Care Physician:  Miguel Aschoff, MD   Discharge Diagnoses:   Active Hospital Problems  Diagnoses Date Noted   . Diverticulitis 07/01/2011   . Normocytic anemia 07/02/2011   . Hypokalemia 07/02/2011   . Hypothyroidism 07/01/2011   . Hearing loss 07/01/2011     Resolved Hospital Problems  Diagnoses Date Noted Date Resolved  . Colitis 07/01/2011 07/03/2011     DISCHARGE MEDICATION: Current Discharge Medication List    START taking these medications   Details  ciprofloxacin (CIPRO) 500 MG tablet Take 1 tablet (500 mg total) by mouth 2 (two) times daily. Qty: 20 tablet, Refills: 0    metroNIDAZOLE (FLAGYL) 500 MG tablet Take 1 tablet (500 mg total) by mouth 3 (three) times daily. Qty: 30 tablet, Refills: 0      CONTINUE these medications which have NOT CHANGED   Details  ferrous sulfate 325 (65 FE) MG tablet Take 325 mg by mouth 2 (two) times daily.      HYDROcodone-acetaminophen (VICODIN) 5-500 MG per tablet Take 1 tablet by mouth every 6 (six) hours as needed. Pain       levothyroxine (SYNTHROID, LEVOTHROID) 112 MCG tablet Take 112 mcg by mouth daily.      nabumetone (RELAFEN) 750 MG tablet Take 750 mg by mouth 2 (two) times daily.      oxybutynin (OXYTROL) 3.9 MG/24HR Place 1 patch onto the skin 2 (two) times a week.      pantoprazole (PROTONIX) 40 MG tablet Take 40 mg by mouth daily.      traMADol (ULTRAM) 50 MG tablet Take 50-100 mg by mouth every 6 (six) hours as needed. Maximum dose= 8 tablets per day       STOP taking these medications     polyethylene glycol (MIRALAX / GLYCOLAX) packet            Consults:   None  SIGNIFICANT DIAGNOSTIC STUDIES:  Ct Abdomen Pelvis W Contrast  07/01/2011  *RADIOLOGY REPORT*  Clinical Data: Constipation, with apparent colonic ileus on radiograph; concern for distal bowel obstruction.  CT ABDOMEN AND PELVIS WITH CONTRAST  Technique:  Multidetector CT imaging of the  abdomen and pelvis was performed following the standard protocol during bolus administration of intravenous contrast.  Contrast:  100 mL of Omnipaque 300 IV contrast  Comparison: CT of the abdomen and pelvis performed 04/01/2009, and abdominal radiograph performed 06/30/2011  Findings: Mild bibasilar atelectasis is noted.  Mild coronary artery calcifications are seen.  There is mild dilatation of the intrahepatic biliary ducts; this may be borderline normal, given apparent mild compression of the gallbladder against the diaphragm anterior to the liver.  This positioning of the gallbladder is new from 2010.  A small amount of surrounding fluid is nonspecific, as it could reflect trace ascites.  The common hepatic duct remains normal in caliber for the patient's age, measuring 0.8 cm in size.  The spleen is unremarkable in appearance.  Multiple bilateral renal cysts are seen, measuring up to 3.4 cm in size.  The kidneys are otherwise unremarkable in appearance.  There is no evidence of hydronephrosis.  No renal or ureteral stones are seen.  No significant perinephric stranding is appreciated.  The small bowel is largely filled with contrast; the distal small bowel remains normal in caliber, and is filled with fluid.  There is a small to moderate hiatal hernia; the stomach is partially filled with contrast and is otherwise unremarkable.  No acute vascular abnormalities are  seen. Scattered calcification is noted along the abdominal aorta and its branches.  There is diffuse distension of the cecum and colon, measuring up to 9.5 cm along the ascending colon.  Gradual decompression is noted along the descending colon.  Along the descending colon, note is made of increasing mild wall thickening.  At the proximal sigmoid colon, there is mild diffuse colonic wall thickening, and at the distal sigmoid colon, note is made of soft tissue inflammation in association with significant diverticulosis, raising concern for mild acute  diverticulitis and mild colitis extending more proximally.  No significant free fluid is seen in conjunction with colonic loops.  The appearance suggests against a mass.  Scattered diverticulosis is noted along the entirety of the colon.  The bladder is mildly distended and somewhat irregular in appearance, likely reflecting compression by the colon.  There is associated distension of the urethra. The patient is status post hysterectomy; no suspicious adnexal masses are seen.  No inguinal lymphadenopathy is seen.  No acute osseous abnormalities are identified.  There is diffuse narrowing of intervertebral disc spaces along the lumbar spine, with grade 1 anterolisthesis of L3 on L4 and grade 1 anterolisthesis of L4 on L5.  Associated vacuum phenomenon is seen. Diffuse facet disease is noted along the lumbar spine.  IMPRESSION:  1.  Suspect mild acute diverticulitis at the distal sigmoid colon, with mild colitis extending more proximally along the sigmoid colon.  There is gradual decompression along the descending colon; diffuse distension of the cecum, ascending colon and transverse colon noted.  No definite evidence for mass.  No evidence for perforation or abscess formation. 2. Mild dilatation of the intrahepatic biliary ducts may be borderline normal, given apparent mild compression of the gallbladder against the diaphragm anterior to the liver. This positioning of the gallbladder is new from 2010; surrounding fluid is nonspecific, as it could reflect trace ascites.  No evidence for distal obstruction. 3. Scattered diverticulosis along the entirety of the colon. 4.  Bilateral renal cysts noted. 5.  Irregular appearance to the bladder likely reflects compression by the colon; associated distension of the urethra. 6.  Mild coronary artery calcifications seen. 7.  Mild bibasilar atelectasis noted. 8.  Degenerative change noted along the lumbar spine, with grade 1 anterolisthesis of L3 on L4 and grade 1  anterolisthesis of L4 on L5. 9.  Small to moderate hiatal hernia noted.  Original Report Authenticated By: Tonia Ghent, M.D.    Recent Results (from the past 240 hour(s))  URINE CULTURE     Status: Normal   Collection Time   07/01/11  1:56 AM      Component Value Range Status Comment   Specimen Description URINE, RANDOM   Final    Special Requests NONE   Final    Setup Time [id number]   Final    Colony Count 90,000 COLONIES/ML   Final    Culture     Final    Value: Multiple bacterial morphotypes present, none predominant. Suggest appropriate recollection if clinically indicated.   Report Status 07/03/2011 FINAL   Final     BRIEF ADMITTING H & P: 77 year old generally healthy female who has known history of diverticulosis but has never had diverticulitis, she states 3 weeks ago she large hard bowel movement. Her stool containing bright red blood, she has had no recurrent bleed since. She however has continued to have severe abdominal pain bilateral lower quadrants radiating upwards. She states her abdomen has been distended since and this is  unusual for her. She cannot describe the pain she states is just painful, 10 out of 10 at its worse. She reports no fevers or chills no nausea, no vomiting. Her last colonoscopy was approximately a year ago. She does have a history of small bowel obstruction. She finally came to ER today because her abdominal pain has been persisting. She states food doesn't affect the pain, however she's not been eating much because of the pain. She reports no burning or urination, no confusion. History obtained from the patient was alert oriented. She currently is in no pain today she is receiving medications.   Active Hospital Problems  Diagnoses Date Noted   . Diverticulitis: She was admitted and started on Cipro and Flagyl CT scan was done with results as above. She tolerated this well her diet was advanced which he tolerated and she will continue a 12 day course  of Cipro and Flagyl at home. For total 14 days.  07/01/2011   . Normocytic anemia: Hemoglobin has remained stable. She is being followed by her gastroenterologist as an outpatient.  07/02/2011   . Hypokalemia: This probably secondary to diarrhea this resolved this was repleted.  07/02/2011   . Hypothyroidism: No changes were made.  07/01/2011     Resolved Hospital Problems  Diagnoses Date Noted Date Resolved  . Colitis: See acute diverticulitis the  07/01/2011 07/03/2011     Disposition and Follow-up:  Followup with PCP in 2 weeks to see how she's doing from her treatment Discharge Orders    Future Orders Please Complete By Expires   Diet - low sodium heart healthy      Increase activity slowly        Follow-up Information    Follow up with ROSS,ALLAN in 1 week.          DISCHARGE EXAM:   General: Alert, awake, oriented x3, in no acute distress.  HEENT: No bruits, no goiter.  Heart: Regular rate and rhythm, without murmurs, rubs, gallops.  Lungs: Good air movement clear to auscultation.  Abdomen: Soft, nontender, nondistended, positive bowel sounds.  Neuro: Grossly intact, nonfocal.  Blood pressure 156/83, pulse 81, temperature 97.5 F (36.4 C), temperature source Oral, resp. rate 18, height 5\' 2"  (1.575 m), weight 63.5 kg (139 lb 15.9 oz), SpO2 91.00%.   Basename 07/02/11 0525 07/01/11 0627 07/01/11 0159  NA 135 -- 132*  K 3.2* -- 2.4*  CL 99 -- 92*  CO2 28 -- 26  GLUCOSE 86 -- 113*  BUN 11 -- 23  CREATININE 0.52 -- 0.66  CALCIUM 8.9 -- 9.8  MG -- 1.9 --  PHOS -- -- --    Basename 07/01/11 0159  AST 22  ALT 10  ALKPHOS 76  BILITOT 0.4  PROT 7.5  ALBUMIN 3.7   No results found for this basename: LIPASE:2,AMYLASE:2 in the last 72 hours  Basename 07/02/11 0525 07/01/11 0159  WBC 4.5 8.1  NEUTROABS -- 6.1  HGB 10.0* 11.3*  HCT 31.0* 33.9*  MCV 89.9 88.7  PLT 476* 553*    Signed: Marinda Elk M.D. 07/03/2011, 11:03 AM

## 2011-07-03 NOTE — Progress Notes (Signed)
Pt discharged home. No changes since am assessment. Pt verbalized understanding of d/c instructions and meds.   Horatio Pel 07/03/2011

## 2011-07-03 NOTE — Progress Notes (Signed)
OT Cancellation Note  Orders received for OT consult.  Went in and talked with pt about the role of OT.  She reports having a walk-in shower and toilet riser and that she always uses her rolling walker around the house.  She states that she is at her normal functional baseline (mod I) and does not require any OT services at this time.  Will sign off.   Perrin Maltese, OTR/L Pager number [phone] 07/03/2011

## 2011-08-13 DIAGNOSIS — K5732 Diverticulitis of large intestine without perforation or abscess without bleeding: Secondary | ICD-10-CM | POA: Diagnosis not present

## 2011-08-13 DIAGNOSIS — K5731 Diverticulosis of large intestine without perforation or abscess with bleeding: Secondary | ICD-10-CM | POA: Diagnosis not present

## 2011-08-25 DIAGNOSIS — M79609 Pain in unspecified limb: Secondary | ICD-10-CM | POA: Diagnosis not present

## 2011-08-25 DIAGNOSIS — I739 Peripheral vascular disease, unspecified: Secondary | ICD-10-CM | POA: Diagnosis not present

## 2011-08-25 DIAGNOSIS — L84 Corns and callosities: Secondary | ICD-10-CM | POA: Diagnosis not present

## 2011-08-25 DIAGNOSIS — H26499 Other secondary cataract, unspecified eye: Secondary | ICD-10-CM | POA: Diagnosis not present

## 2011-08-25 DIAGNOSIS — B351 Tinea unguium: Secondary | ICD-10-CM | POA: Diagnosis not present

## 2011-08-26 DIAGNOSIS — D500 Iron deficiency anemia secondary to blood loss (chronic): Secondary | ICD-10-CM | POA: Diagnosis not present

## 2011-08-26 DIAGNOSIS — E876 Hypokalemia: Secondary | ICD-10-CM | POA: Diagnosis not present

## 2011-08-26 DIAGNOSIS — E039 Hypothyroidism, unspecified: Secondary | ICD-10-CM | POA: Diagnosis not present

## 2011-09-17 ENCOUNTER — Other Ambulatory Visit: Payer: Medicare Other | Admitting: Lab

## 2011-10-01 DIAGNOSIS — H905 Unspecified sensorineural hearing loss: Secondary | ICD-10-CM | POA: Diagnosis not present

## 2011-10-14 ENCOUNTER — Telehealth: Payer: Self-pay | Admitting: Oncology

## 2011-10-14 ENCOUNTER — Other Ambulatory Visit: Payer: Medicare Other | Admitting: Lab

## 2011-10-14 ENCOUNTER — Ambulatory Visit: Payer: Medicare Other | Admitting: Oncology

## 2011-10-14 NOTE — Telephone Encounter (Signed)
Pt called today to cx appt for today. Per pt she will call back to r/s.

## 2011-10-15 DIAGNOSIS — H905 Unspecified sensorineural hearing loss: Secondary | ICD-10-CM | POA: Diagnosis not present

## 2011-10-15 DIAGNOSIS — H908 Mixed conductive and sensorineural hearing loss, unspecified: Secondary | ICD-10-CM | POA: Diagnosis not present

## 2011-10-15 DIAGNOSIS — H7290 Unspecified perforation of tympanic membrane, unspecified ear: Secondary | ICD-10-CM | POA: Diagnosis not present

## 2011-11-03 DIAGNOSIS — L608 Other nail disorders: Secondary | ICD-10-CM | POA: Diagnosis not present

## 2011-11-03 DIAGNOSIS — L84 Corns and callosities: Secondary | ICD-10-CM | POA: Diagnosis not present

## 2011-11-03 DIAGNOSIS — I739 Peripheral vascular disease, unspecified: Secondary | ICD-10-CM | POA: Diagnosis not present

## 2011-12-04 DIAGNOSIS — E039 Hypothyroidism, unspecified: Secondary | ICD-10-CM | POA: Diagnosis not present

## 2011-12-24 DIAGNOSIS — M1710 Unilateral primary osteoarthritis, unspecified knee: Secondary | ICD-10-CM | POA: Diagnosis not present

## 2012-02-25 DIAGNOSIS — M2550 Pain in unspecified joint: Secondary | ICD-10-CM | POA: Diagnosis not present

## 2012-02-25 DIAGNOSIS — L408 Other psoriasis: Secondary | ICD-10-CM | POA: Diagnosis not present

## 2012-05-18 DIAGNOSIS — M6530 Trigger finger, unspecified finger: Secondary | ICD-10-CM | POA: Diagnosis not present

## 2012-05-18 DIAGNOSIS — G5600 Carpal tunnel syndrome, unspecified upper limb: Secondary | ICD-10-CM | POA: Diagnosis not present

## 2012-06-29 DIAGNOSIS — H7290 Unspecified perforation of tympanic membrane, unspecified ear: Secondary | ICD-10-CM | POA: Diagnosis not present

## 2012-06-29 DIAGNOSIS — H905 Unspecified sensorineural hearing loss: Secondary | ICD-10-CM | POA: Diagnosis not present

## 2012-06-29 DIAGNOSIS — H908 Mixed conductive and sensorineural hearing loss, unspecified: Secondary | ICD-10-CM | POA: Diagnosis not present

## 2012-09-21 ENCOUNTER — Encounter (HOSPITAL_COMMUNITY): Payer: Self-pay | Admitting: Emergency Medicine

## 2012-09-21 ENCOUNTER — Inpatient Hospital Stay (HOSPITAL_COMMUNITY)
Admission: EM | Admit: 2012-09-21 | Discharge: 2012-09-24 | DRG: 389 | Disposition: A | Discharge status: Home / Self Care | Payer: Medicare Other | Attending: Internal Medicine | Admitting: Internal Medicine

## 2012-09-21 ENCOUNTER — Emergency Department (HOSPITAL_COMMUNITY): Payer: Medicare Other

## 2012-09-21 DIAGNOSIS — D509 Iron deficiency anemia, unspecified: Secondary | ICD-10-CM | POA: Diagnosis present

## 2012-09-21 DIAGNOSIS — K56609 Unspecified intestinal obstruction, unspecified as to partial versus complete obstruction: Secondary | ICD-10-CM | POA: Diagnosis not present

## 2012-09-21 DIAGNOSIS — K5792 Diverticulitis of intestine, part unspecified, without perforation or abscess without bleeding: Secondary | ICD-10-CM

## 2012-09-21 DIAGNOSIS — E876 Hypokalemia: Secondary | ICD-10-CM | POA: Diagnosis not present

## 2012-09-21 DIAGNOSIS — Z79899 Other long term (current) drug therapy: Secondary | ICD-10-CM

## 2012-09-21 DIAGNOSIS — Z66 Do not resuscitate: Secondary | ICD-10-CM | POA: Diagnosis present

## 2012-09-21 DIAGNOSIS — N390 Urinary tract infection, site not specified: Secondary | ICD-10-CM | POA: Diagnosis present

## 2012-09-21 DIAGNOSIS — Z8719 Personal history of other diseases of the digestive system: Secondary | ICD-10-CM

## 2012-09-21 DIAGNOSIS — D649 Anemia, unspecified: Secondary | ICD-10-CM | POA: Diagnosis not present

## 2012-09-21 DIAGNOSIS — K565 Intestinal adhesions [bands], unspecified as to partial versus complete obstruction: Principal | ICD-10-CM | POA: Diagnosis present

## 2012-09-21 DIAGNOSIS — J90 Pleural effusion, not elsewhere classified: Secondary | ICD-10-CM | POA: Diagnosis not present

## 2012-09-21 DIAGNOSIS — R0602 Shortness of breath: Secondary | ICD-10-CM | POA: Diagnosis not present

## 2012-09-21 DIAGNOSIS — IMO0001 Reserved for inherently not codable concepts without codable children: Secondary | ICD-10-CM | POA: Diagnosis present

## 2012-09-21 DIAGNOSIS — K5900 Constipation, unspecified: Secondary | ICD-10-CM | POA: Diagnosis not present

## 2012-09-21 DIAGNOSIS — R188 Other ascites: Secondary | ICD-10-CM | POA: Diagnosis not present

## 2012-09-21 DIAGNOSIS — E039 Hypothyroidism, unspecified: Secondary | ICD-10-CM | POA: Diagnosis present

## 2012-09-21 DIAGNOSIS — K449 Diaphragmatic hernia without obstruction or gangrene: Secondary | ICD-10-CM | POA: Diagnosis present

## 2012-09-21 DIAGNOSIS — I519 Heart disease, unspecified: Secondary | ICD-10-CM | POA: Diagnosis present

## 2012-09-21 LAB — LIPASE, BLOOD: Lipase: 25 U/L (ref 11–59)

## 2012-09-21 LAB — CBC WITH DIFFERENTIAL/PLATELET
Basophils Absolute: 0 10*3/uL (ref 0.0–0.1)
Eosinophils Absolute: 0 10*3/uL (ref 0.0–0.7)
HCT: 27.9 % — ABNORMAL LOW (ref 36.0–46.0)
Lymphs Abs: 0.7 10*3/uL (ref 0.7–4.0)
MCH: 21.5 pg — ABNORMAL LOW (ref 26.0–34.0)
MCHC: 29.7 g/dL — ABNORMAL LOW (ref 30.0–36.0)
MCV: 72.3 fL — ABNORMAL LOW (ref 78.0–100.0)
Monocytes Absolute: 0.7 10*3/uL (ref 0.1–1.0)
Neutro Abs: 9.9 10*3/uL — ABNORMAL HIGH (ref 1.7–7.7)
Platelets: 643 10*3/uL — ABNORMAL HIGH (ref 150–400)
RDW: 17.2 % — ABNORMAL HIGH (ref 11.5–15.5)

## 2012-09-21 LAB — COMPREHENSIVE METABOLIC PANEL
Albumin: 3.3 g/dL — ABNORMAL LOW (ref 3.5–5.2)
BUN: 25 mg/dL — ABNORMAL HIGH (ref 6–23)
Calcium: 8.9 mg/dL (ref 8.4–10.5)
Chloride: 95 mEq/L — ABNORMAL LOW (ref 96–112)
Creatinine, Ser: 0.62 mg/dL (ref 0.50–1.10)
Total Bilirubin: 0.3 mg/dL (ref 0.3–1.2)
Total Protein: 7.1 g/dL (ref 6.0–8.3)

## 2012-09-21 MED ORDER — IOHEXOL 300 MG/ML  SOLN
80.0000 mL | Freq: Once | INTRAMUSCULAR | Status: AC | PRN
  Administered 2012-09-21: 80 mL via INTRAVENOUS

## 2012-09-21 MED ORDER — SODIUM CHLORIDE 0.9 % IV SOLN
1000.0000 mL | Freq: Once | INTRAVENOUS | Status: AC
  Administered 2012-09-21: 1000 mL via INTRAVENOUS

## 2012-09-21 MED ORDER — HYDROMORPHONE HCL PF 1 MG/ML IJ SOLN: 1.0000 mg | INTRAMUSCULAR | Status: DC | PRN

## 2012-09-21 MED ORDER — IOHEXOL 300 MG/ML  SOLN
50.0000 mL | Freq: Once | INTRAMUSCULAR | Status: AC | PRN
  Administered 2012-09-21: 50 mL via ORAL

## 2012-09-21 MED ORDER — FENTANYL CITRATE 0.05 MG/ML IJ SOLN
25.0000 ug | INTRAMUSCULAR | Status: DC | PRN
  Administered 2012-09-21 (×2): 25 ug via INTRAVENOUS
  Filled 2012-09-21 (×2): qty 2

## 2012-09-21 MED ORDER — ONDANSETRON HCL 4 MG/2ML IJ SOLN
4.0000 mg | Freq: Once | INTRAMUSCULAR | Status: AC
  Administered 2012-09-21: 4 mg via INTRAVENOUS
  Filled 2012-09-21: qty 2

## 2012-09-21 NOTE — ED Notes (Signed)
Per EMS.  Pt from home.  Pt has had abd pain for past 2 days.  LBM 3 days ago, hx diverticulitis.  Last time pt had abd issues was 2 years ago.  Pt states she has nausea, but no v/d.

## 2012-09-21 NOTE — H&P (Signed)
[facility] History and Physical  [name] EXB:[id number] DOB: 1925-11-25 DOA: 09/21/2012  Referring physician: Dr. Jeraldine Loots PCP: Miguel Aschoff, MD  Specialists: none  Chief Complaint: Abdominal discomfort  HPI: [name] is a 79 y.o. female  With PMH listed below presents with 2 day history of abdominal discomfort.  The discomfort has been persistent since onset.  Nothing she is aware of makes it better.  She reports some discomfort after eating and drinking and reports a decreased po intake as a result of her abdominal discomfort.  She denies any sick contacts, recent travel, and no bouts of emesis.  She describes the pain as an achy type of pain and it is poorly localized.  She has been able to pass flatus and had a BM yesterday.  Denies any blood in stools.  While in the ED patient had CT scan of abdomen which was interpreted as : High-grade small bowel obstruction with transition point in the  central lower abdomen (likely proximal to mid ileum) - no definite  cause and may be secondary to adhesion. Small amount of ascites.  No evidence of pneumoperitoneum.  Large hiatal hernia.  Given above finding we were consulted for further evaluation and recommendations.  Review of Systems: 10 point review of systems reviewed and negative unless otherwise mentioned above  Past Medical History  Diagnosis Date  . Diverticulitis   . Arthritis   . Fibromyalgia   . Diverticulosis   . Hypothyroidism   . Osteoarthritis   . DJD (degenerative joint disease)    Past Surgical History  Procedure Laterality Date  . Ectopic pregnancy surgery    . Abdominal hysterectomy    . Abdominal adhesion surgery    . Knee arthroplasty     Social History:  reports that she quit smoking about 28 years ago. Her smoking use included Cigarettes. She smoked 0.00 packs per day. She has never used smokeless tobacco. She reports that she does not drink alcohol or use illicit drugs.  Can patient  participate in ADLs? yes  Allergies  Allergen Reactions  . Antihistamines, Chlorpheniramine-Type   . Codeine     headache    Family History  Problem Relation Age of Onset  . Prostate cancer    None other reported  Prior to Admission medications   Medication Sig Start Date End Date Taking? Authorizing Provider  Calcium-Magnesium-Zinc (CAL-MAG-ZINC PO) Take 1 tablet by mouth daily.   Yes Historical Provider, MD  HYDROcodone-acetaminophen (VICODIN) 5-500 MG per tablet Take 1-2 tablets by mouth every 4 (four) hours as needed for pain. Pain     Yes Historical Provider, MD  levothyroxine (SYNTHROID, LEVOTHROID) 100 MCG tablet Take 100 mcg by mouth. Take 100 mcg on Monday and Friday.  Take 112 mcg all other days.   Yes Historical Provider, MD  levothyroxine (SYNTHROID, LEVOTHROID) 112 MCG tablet Take 112 mcg by mouth. Take 112 mcg on Tuesday, Wednesday, Thursday, Saturday and Sunday.Take 100 mcg on other days.   Yes Historical Provider, MD  nabumetone (RELAFEN) 750 MG tablet Take 750 mg by mouth as needed (as directed.).    Yes Historical Provider, MD  polyethylene glycol (MIRALAX / GLYCOLAX) packet Take 17 g by mouth daily.   Yes Historical Provider, MD  traMADol (ULTRAM) 50 MG tablet Take 50-100 mg by mouth. Maximum dose= 8 tablets per day   Yes Historical Provider, MD   Physical Exam: Filed Vitals:   09/21/12 1852  BP: 129/69  Pulse: 91  Resp: 16  SpO2:  97%     General:  Pt in NAD, Alert and Awake  Eyes: EOMI, non icteric  ENT: normal exterior appearance, no masses on visual inspection  Neck:  Supple, no goiter  Cardiovascular: RRR, No MRG  Respiratory: CTA BL, no wheezes  Abdomen: soft, ND, tenderness with general deep palpation, no guarding no rebound tenderness  Skin: warm and dry  Musculoskeletal: no cyanosis or clubbing  Psychiatric: mood and affect appropriate  Neurologic: answers questions appropriately moves all extremities.  Labs on Admission:  Basic  Metabolic Panel:  Recent Labs Lab 09/21/12 2015  NA 133*  K 3.4*  CL 95*  CO2 24  GLUCOSE 129*  BUN 25*  CREATININE 0.62  CALCIUM 8.9   Liver Function Tests:  Recent Labs Lab 09/21/12 2015  AST 18  ALT 6  ALKPHOS 59  BILITOT 0.3  PROT 7.1  ALBUMIN 3.3*    Recent Labs Lab 09/21/12 2015  LIPASE 25   No results found for this basename: AMMONIA,  in the last 168 hours CBC:  Recent Labs Lab 09/21/12 2015  WBC 11.3*  NEUTROABS 9.9*  HGB 8.3*  HCT 27.9*  MCV 72.3*  PLT 643*   Cardiac Enzymes: No results found for this basename: CKTOTAL, CKMB, CKMBINDEX, TROPONINI,  in the last 168 hours  BNP (last 3 results) No results found for this basename: PROBNP,  in the last 8760 hours CBG: No results found for this basename: GLUCAP,  in the last 168 hours  Radiological Exams on Admission: Ct Abdomen Pelvis W Contrast  09/21/2012  *RADIOLOGY REPORT*  Clinical Data: 79 year old female with abdominal and pelvic pain. Nausea.  CT ABDOMEN AND PELVIS WITH CONTRAST  Technique:  Multidetector CT imaging of the abdomen and pelvis was performed following the standard protocol during bolus administration of intravenous contrast.  Contrast: 80 ml intravenous Omnipaque-300  Comparison: 07/01/2011 CT  Findings: There are multiple dilated small bowel loops with the distal ileum collapsed - compatible with high-grade small bowel obstruction.  The transition point is identified within the central lower abdomen, but no definite cause identified.  A small amount of ascites is identified within the abdomen and pelvis. There is no evidence of pneumoperitoneum.  A large hiatal hernia is present.  The liver, spleen and pancreas are unremarkable. The gallbladder is distended. Fullness of both adrenal glands are again noted. Mild bilateral renal cortical thinning and bilateral renal cysts again noted.  There is no evidence of enlarged lymph nodes or abdominal aortic aneurysm. Colonic diverticulosis is  noted without evidence of diverticulitis. The patient is status post hysterectomy. No acute or suspicious bony abnormalities are identified. Degenerative changes throughout the lumbar spine identified.  IMPRESSION: High-grade small bowel obstruction with transition point in the central lower abdomen (likely proximal to mid ileum) - no definite cause and may be secondary to adhesion.  Small amount of ascites. No evidence of pneumoperitoneum.  Large hiatal hernia.   Original Report Authenticated By: Harmon Pier, M.D.     Assessment/Plan Active Problems:  1. SBO - bowel rest - MIVF's - npo for now.  - obtain abdominal x ray next am. - Patient does no have surgical abdomen on exam.  No reports of emesis and therefore will not insert ng tube at this juncture. - place order for nursing to ambulate patient.  2. Hypothyroidism - will hold home regimen - once patient taking po medication will resume home regimen  3. Anemia - check anemia panel - recheck cbc next am  Code Status: DNR Family Communication: None at bedside. Disposition Plan: Pending improvement in condition.  Time spent: > 60 minutes  Penny Pia [facility] Pager [phone]  If 7PM-7AM, please contact night-coverage [email or web address] Password [facility] 09/21/2012, 11:15 PM

## 2012-09-21 NOTE — ED Provider Notes (Signed)
History     CSN: [id number]  Arrival date & time 09/21/12  4098   First MD Initiated Contact with Patient 09/21/12 1930      Chief Complaint  Patient presents with  . Abdominal Pain    (Consider location/radiation/quality/duration/timing/severity/associated sxs/prior treatment) HPI  The patient presents with 2 days of abdominal pain.  The pain is diffuse, crampy, sore.  The pain began without clear precipitant.  Since onset the pain has been present without clear alleviating or exacerbating factors.  Patient is also anorexic.  She denies significant bowel habit changes.  She states that the pain is similar to an episode of diverticulitis that occurred several years ago. She denies current fevers, chills, chest pain, dyspnea.   Past Medical History  Diagnosis Date  . Diverticulitis   . Arthritis   . Fibromyalgia   . Diverticulosis   . Hypothyroidism   . Osteoarthritis   . DJD (degenerative joint disease)     Past Surgical History  Procedure Laterality Date  . Ectopic pregnancy surgery    . Abdominal hysterectomy    . Abdominal adhesion surgery    . Knee arthroplasty      Family History  Problem Relation Age of Onset  . Prostate cancer      History  Substance Use Topics  . Smoking status: Former Smoker    Types: Cigarettes    Quit date: 06/30/1984  . Smokeless tobacco: Never Used  . Alcohol Use: No    OB History   Grav Para Term Preterm Abortions TAB SAB Ect Mult Living                  Review of Systems  Constitutional:       Per HPI, otherwise negative  HENT:       Per HPI, otherwise negative  Respiratory:       Per HPI, otherwise negative  Cardiovascular:       Per HPI, otherwise negative  Gastrointestinal: Negative for vomiting.  Endocrine:       Negative aside from HPI  Genitourinary:       Neg aside from HPI   Musculoskeletal:       Per HPI, otherwise negative  Skin: Negative.   Neurological: Negative for syncope.    Allergies   Antihistamines, chlorpheniramine-type and Codeine  Home Medications   Current Outpatient Rx  Name  Route  Sig  Dispense  Refill  . Calcium-Magnesium-Zinc (CAL-MAG-ZINC PO)   Oral   Take 1 tablet by mouth daily.         Marland Kitchen HYDROcodone-acetaminophen (VICODIN) 5-500 MG per tablet   Oral   Take 1-2 tablets by mouth every 4 (four) hours as needed for pain. Pain           . levothyroxine (SYNTHROID, LEVOTHROID) 100 MCG tablet   Oral   Take 100 mcg by mouth. Take 100 mcg on Monday and Friday.  Take 112 mcg all other days.         Marland Kitchen levothyroxine (SYNTHROID, LEVOTHROID) 112 MCG tablet   Oral   Take 112 mcg by mouth. Take 112 mcg on Tuesday, Wednesday, Thursday, Saturday and Sunday.Take 100 mcg on other days.         . nabumetone (RELAFEN) 750 MG tablet   Oral   Take 750 mg by mouth as needed (as directed.).          Marland Kitchen polyethylene glycol (MIRALAX / GLYCOLAX) packet   Oral   Take 17 g by  mouth daily.         . traMADol (ULTRAM) 50 MG tablet   Oral   Take 50-100 mg by mouth. Maximum dose= 8 tablets per day           BP 129/69  Pulse 91  Resp 16  SpO2 97%  Physical Exam  Nursing note and vitals reviewed. Constitutional: She is oriented to person, place, and time. She appears well-developed and well-nourished. No distress.  HENT:  Head: Normocephalic and atraumatic.  Eyes: Conjunctivae and EOM are normal.  Cardiovascular: Normal rate and regular rhythm.   Pulmonary/Chest: Effort normal and breath sounds normal. No stridor. No respiratory distress.  Abdominal: She exhibits no distension. There is generalized tenderness. There is guarding. There is no rigidity and no rebound.  Musculoskeletal: She exhibits no edema.  Neurological: She is alert and oriented to person, place, and time. No cranial nerve deficit.  Skin: Skin is warm and dry.  Psychiatric: She has a normal mood and affect.    ED Course  Procedures (including critical care time)  Labs Reviewed   CBC WITH DIFFERENTIAL - Abnormal; Notable for the following:    WBC 11.3 (*)    RBC 3.86 (*)    Hemoglobin 8.3 (*)    HCT 27.9 (*)    MCV 72.3 (*)    MCH 21.5 (*)    MCHC 29.7 (*)    RDW 17.2 (*)    Platelets 643 (*)    Neutrophils Relative 88 (*)    Lymphocytes Relative 6 (*)    Neutro Abs 9.9 (*)    All other components within normal limits  COMPREHENSIVE METABOLIC PANEL - Abnormal; Notable for the following:    Sodium 133 (*)    Potassium 3.4 (*)    Chloride 95 (*)    Glucose, Bld 129 (*)    BUN 25 (*)    Albumin 3.3 (*)    GFR calc non Af Amer 81 (*)    All other components within normal limits  LIPASE, BLOOD  URINALYSIS, ROUTINE W REFLEX MICROSCOPIC   No results found.   No diagnosis found.  Pulse ox 97% room air normal  On repeat exam the patient continues to have mild pain, though she appears more comfortable.    MDM  This elderly female presents with 2 days of abdominal pain.  She is hemodynamically stable but CT scan demonstrates bowel obstruction with transition point.  The patient's abdomen is soft, nontender.  She has a mild leukocytosis, but absent peritoneal findings, she was admitted to the medicine service for further evaluation and management.  The patient deferred an NG tube placement        Gerhard Munch, MD 09/21/12 2303

## 2012-09-22 ENCOUNTER — Encounter (HOSPITAL_COMMUNITY): Payer: Self-pay

## 2012-09-22 ENCOUNTER — Inpatient Hospital Stay (HOSPITAL_COMMUNITY): Payer: Medicare Other

## 2012-09-22 DIAGNOSIS — E876 Hypokalemia: Secondary | ICD-10-CM

## 2012-09-22 DIAGNOSIS — D649 Anemia, unspecified: Secondary | ICD-10-CM | POA: Diagnosis present

## 2012-09-22 DIAGNOSIS — R0602 Shortness of breath: Secondary | ICD-10-CM | POA: Diagnosis not present

## 2012-09-22 LAB — BASIC METABOLIC PANEL
BUN: 23 mg/dL (ref 6–23)
Chloride: 99 mEq/L (ref 96–112)
Creatinine, Ser: 0.56 mg/dL (ref 0.50–1.10)
GFR calc Af Amer: >90 mL/min (ref >90)
GFR calc non Af Amer: 83 mL/min — ABNORMAL LOW (ref >90)

## 2012-09-22 LAB — URINALYSIS, ROUTINE W REFLEX MICROSCOPIC
Ketones, ur: 15 mg/dL — AB
Nitrite: POSITIVE — AB
Protein, ur: NEGATIVE mg/dL
pH: 5 (ref 5.0–8.0)

## 2012-09-22 LAB — VITAMIN B12: Vitamin B-12: 1099 pg/mL — ABNORMAL HIGH (ref 211–911)

## 2012-09-22 LAB — CBC
HCT: 23.2 % — ABNORMAL LOW (ref 36.0–46.0)
MCHC: 30.2 g/dL (ref 30.0–36.0)
Platelets: 543 10*3/uL — ABNORMAL HIGH (ref 150–400)
RDW: 17.3 % — ABNORMAL HIGH (ref 11.5–15.5)
WBC: 6.8 10*3/uL (ref 4.0–10.5)

## 2012-09-22 LAB — RETICULOCYTES
Retic Count, Absolute: 56.7 10*3/uL (ref 19.0–186.0)
Retic Ct Pct: 1.8 % (ref 0.4–3.1)

## 2012-09-22 LAB — PREPARE RBC (CROSSMATCH)

## 2012-09-22 LAB — IRON AND TIBC: Iron: <10 ug/dL — ABNORMAL LOW (ref 42–135)

## 2012-09-22 LAB — MAGNESIUM: Magnesium: 2.2 mg/dL (ref 1.5–2.5)

## 2012-09-22 LAB — PRO B NATRIURETIC PEPTIDE: Pro B Natriuretic peptide (BNP): 1297 pg/mL — ABNORMAL HIGH (ref 0–450)

## 2012-09-22 LAB — URINE MICROSCOPIC-ADD ON

## 2012-09-22 LAB — FERRITIN: Ferritin: 8 ng/mL — ABNORMAL LOW (ref 10–291)

## 2012-09-22 MED ORDER — TRAMADOL HCL 50 MG PO TABS: 50.0000 mg | ORAL_TABLET | Freq: Four times a day (QID) | ORAL | Status: DC | PRN

## 2012-09-22 MED ORDER — FUROSEMIDE 10 MG/ML IJ SOLN
40.0000 mg | Freq: Once | INTRAMUSCULAR | Status: AC
  Administered 2012-09-22: 40 mg via INTRAVENOUS
  Filled 2012-09-22: qty 4

## 2012-09-22 MED ORDER — POTASSIUM CHLORIDE 10 MEQ/100ML IV SOLN
10.0000 meq | INTRAVENOUS | Status: AC
  Administered 2012-09-22 (×4): 10 meq via INTRAVENOUS
  Filled 2012-09-22 (×4): qty 100

## 2012-09-22 MED ORDER — ACETAMINOPHEN 325 MG PO TABS
650.0000 mg | ORAL_TABLET | Freq: Four times a day (QID) | ORAL | Status: DC | PRN
  Administered 2012-09-22: 650 mg via ORAL
  Filled 2012-09-22: qty 2

## 2012-09-22 MED ORDER — MORPHINE SULFATE 2 MG/ML IJ SOLN: 1.0000 mg | INTRAMUSCULAR | Status: DC | PRN

## 2012-09-22 MED ORDER — ONDANSETRON HCL 4 MG PO TABS: 4.0000 mg | ORAL_TABLET | Freq: Four times a day (QID) | ORAL | Status: DC | PRN

## 2012-09-22 MED ORDER — DEXTROSE-NACL 5-0.9 % IV SOLN
INTRAVENOUS | Status: DC
  Administered 2012-09-22: 03:00:00 via INTRAVENOUS

## 2012-09-22 MED ORDER — ONDANSETRON HCL 4 MG/2ML IJ SOLN: 4.0000 mg | Freq: Four times a day (QID) | INTRAMUSCULAR | Status: DC | PRN

## 2012-09-22 MED ORDER — ACETAMINOPHEN 650 MG RE SUPP: 650.0000 mg | Freq: Four times a day (QID) | RECTAL | Status: DC | PRN

## 2012-09-22 NOTE — Progress Notes (Signed)
UR completed 

## 2012-09-22 NOTE — Progress Notes (Addendum)
[facility] PROGRESS NOTE  GRAYSON WHITE AVW:[id number] DOB: 03-30-1926 DOA: 09/21/2012 PCP: Miguel Aschoff, MD  Assessment/Plan: Small bowel obstruction Possibly the setting of previous adhesions. Symptoms including this morning. Has had bowel movements already and is nontender on exam. Follow labs. -Continue n.p.o. -Followup x-ray of the abdomen. If improved will start on clears.  Shortness of breath Symptoms since this morning. History of heart failure in the system. We'll check history of the chest as patient has by basilar crackles. Check ProBNP. Discontinue fluids.  Anemia Has history of pandiverticulosis and possible diverticular bleed had a colonoscopy in 2011 by [facility]. Of informs following with Dr. Cyndie Chime and receiving periodic transfusions for anemia. Cannot find any notes of that in the system.  -Iron panel and vitamin B 12 level previously have been normal. Her hemoglobin has dropped to 7 this morning. We'll check stool for occult blood. Will discuss with Dr. Cyndie Chime and [facility] if necessary  Hypothyroidism Only medications as patient is n.p.o.  Hypokalemia replenish   Code Status: DO NOT RESUSCITATE Family Communication: friend at bedside Disposition Plan: Home once stable   Consultants:   none  Procedures:  None  Antibiotics:  None  HPI/Subjective: Abdominal pain better or complaints of shortness of breath this morning. Has had 3 soft bowel movements this morning.  Objective: Filed Vitals:   09/21/12 1852 09/22/12 0110 09/22/12 0500  BP: 129/69 145/75 135/73  Pulse: 91 95 94  Temp:  98.2 F (36.8 C) 98.1 F (36.7 C)  TempSrc:  Oral Oral  Resp: 16 18 18   Height:  5\' 2"  (1.575 m)   Weight:  66.5 kg (146 lb 9.7 oz)   SpO2: 97% 96% 97%    Intake/Output Summary (Last 24 hours) at 09/22/12 0858 Last data filed at 09/22/12 8295  Gross per 24 hour  Intake    275 ml  Output    451 ml  Net   -176 ml   Filed Weights   09/22/12 0110   Weight: 66.5 kg (146 lb 9.7 oz)    Exam:   General:  Elderly female lying in bed in no acute distress  HEENT: No pallor, moist oral mucosa  Cardiovascular: Normal S1 and S2, no murmurs rub or gallop  Respiratory: Bilateral equal air entry, by basilar crackles  Abdomen: Distended, nontender, bowel sounds present  Extremities: Warm, no edema  CNS: AAO x3  Data Reviewed: Basic Metabolic Panel:  Recent Labs Lab 09/21/12 2015 09/22/12 0515  NA 133* 135  K 3.4* 3.1*  CL 95* 99  CO2 24 26  GLUCOSE 129* 141*  BUN 25* 23  CREATININE 0.62 0.56  CALCIUM 8.9 8.1*   Liver Function Tests:  Recent Labs Lab 09/21/12 2015  AST 18  ALT 6  ALKPHOS 59  BILITOT 0.3  PROT 7.1  ALBUMIN 3.3*    Recent Labs Lab 09/21/12 2015  LIPASE 25   No results found for this basename: AMMONIA,  in the last 168 hours CBC:  Recent Labs Lab 09/21/12 2015 09/22/12 0515  WBC 11.3* 6.8  NEUTROABS 9.9*  --   HGB 8.3* 7.0*  HCT 27.9* 23.2*  MCV 72.3* 73.0*  PLT 643* 543*   Cardiac Enzymes: No results found for this basename: CKTOTAL, CKMB, CKMBINDEX, TROPONINI,  in the last 168 hours BNP (last 3 results) No results found for this basename: PROBNP,  in the last 8760 hours CBG: No results found for this basename: GLUCAP,  in the last 168 hours  No results  found for this or any previous visit (from the past 240 hour(s)).   Studies: Ct Abdomen Pelvis W Contrast  09/21/2012  *RADIOLOGY REPORT*  Clinical Data: 79 year old female with abdominal and pelvic pain. Nausea.  CT ABDOMEN AND PELVIS WITH CONTRAST  Technique:  Multidetector CT imaging of the abdomen and pelvis was performed following the standard protocol during bolus administration of intravenous contrast.  Contrast: 80 ml intravenous Omnipaque-300  Comparison: 07/01/2011 CT  Findings: There are multiple dilated small bowel loops with the distal ileum collapsed - compatible with high-grade small bowel obstruction.  The transition  point is identified within the central lower abdomen, but no definite cause identified.  A small amount of ascites is identified within the abdomen and pelvis. There is no evidence of pneumoperitoneum.  A large hiatal hernia is present.  The liver, spleen and pancreas are unremarkable. The gallbladder is distended. Fullness of both adrenal glands are again noted. Mild bilateral renal cortical thinning and bilateral renal cysts again noted.  There is no evidence of enlarged lymph nodes or abdominal aortic aneurysm. Colonic diverticulosis is noted without evidence of diverticulitis. The patient is status post hysterectomy. No acute or suspicious bony abnormalities are identified. Degenerative changes throughout the lumbar spine identified.  IMPRESSION: High-grade small bowel obstruction with transition point in the central lower abdomen (likely proximal to mid ileum) - no definite cause and may be secondary to adhesion.  Small amount of ascites. No evidence of pneumoperitoneum.  Large hiatal hernia.   Original Report Authenticated By: Harmon Pier, M.D.     Scheduled Meds: . potassium chloride  10 mEq Intravenous Q1 Hr x 4   Continuous Infusions:    Time spent: 25 MINUTES    Eddie North  [facility] Pager [phone] If 8PM-8AM, please contact night-coverage at [email or web address], password [facility] 09/22/2012, 8:58 AM  LOS: 1 day

## 2012-09-23 ENCOUNTER — Inpatient Hospital Stay (HOSPITAL_COMMUNITY): Payer: Medicare Other

## 2012-09-23 DIAGNOSIS — R58 Hemorrhage, not elsewhere classified: Secondary | ICD-10-CM

## 2012-09-23 DIAGNOSIS — D509 Iron deficiency anemia, unspecified: Secondary | ICD-10-CM

## 2012-09-23 LAB — BASIC METABOLIC PANEL
BUN: 14 mg/dL (ref 6–23)
Chloride: 95 mEq/L — ABNORMAL LOW (ref 96–112)
Creatinine, Ser: 0.51 mg/dL (ref 0.50–1.10)
GFR calc non Af Amer: 86 mL/min — ABNORMAL LOW (ref >90)
Glucose, Bld: 90 mg/dL (ref 70–99)
Potassium: 2.8 mEq/L — ABNORMAL LOW (ref 3.5–5.1)

## 2012-09-23 LAB — TYPE AND SCREEN
ABO/RH(D): AB POS
Unit division: 0

## 2012-09-23 LAB — CBC
HCT: 33.4 % — ABNORMAL LOW (ref 36.0–46.0)
Hemoglobin: 10.4 g/dL — ABNORMAL LOW (ref 12.0–15.0)
MCHC: 31.1 g/dL (ref 30.0–36.0)
MCV: 74.2 fL — ABNORMAL LOW (ref 78.0–100.0)

## 2012-09-23 MED ORDER — POTASSIUM CHLORIDE 10 MEQ/100ML IV SOLN
10.0000 meq | INTRAVENOUS | Status: AC
  Administered 2012-09-23 (×2): 10 meq via INTRAVENOUS
  Filled 2012-09-23 (×4): qty 100

## 2012-09-23 MED ORDER — SODIUM CHLORIDE 0.9 % IV SOLN
1020.0000 mg | Freq: Once | INTRAVENOUS | Status: AC
  Administered 2012-09-23: 1020 mg via INTRAVENOUS
  Filled 2012-09-23: qty 34

## 2012-09-23 MED ORDER — CIPROFLOXACIN IN D5W 200 MG/100ML IV SOLN
200.0000 mg | Freq: Two times a day (BID) | INTRAVENOUS | Status: DC
  Administered 2012-09-23 – 2012-09-24 (×3): 200 mg via INTRAVENOUS
  Filled 2012-09-23 (×4): qty 100

## 2012-09-23 MED ORDER — MAGNESIUM SULFATE IN D5W 10-5 MG/ML-% IV SOLN
1.0000 g | Freq: Once | INTRAVENOUS | Status: AC
  Administered 2012-09-23: 1 g via INTRAVENOUS
  Filled 2012-09-23: qty 100

## 2012-09-23 MED ORDER — POTASSIUM CHLORIDE 10 MEQ/100ML IV SOLN
10.0000 meq | INTRAVENOUS | Status: AC
  Administered 2012-09-23 (×2): 10 meq via INTRAVENOUS
  Filled 2012-09-23: qty 100

## 2012-09-23 NOTE — Clinical Documentation Improvement (Signed)
Anemia Documentation Clarification Query  THIS DOCUMENT IS NOT A PERMANENT PART OF THE MEDICAL RECORD  RESPOND TO THE THIS QUERY, FOLLOW THE INSTRUCTIONS BELOW:  1. If needed, update documentation for the patient's encounter via the notes activity.  2. Access this query again and click edit on the In [employer].  3. After updating, or not, click F2 to complete all highlighted (required) fields concerning your review. Select "additional documentation in the medical record" OR "no additional documentation provided".  4. Click Sign note button.  5. The deficiency will fall out of your In Basket *Please let us know if you are not able to complete this workflow by phone or e-mail (listed below).          09/23/12  Dear Dr. Gonzella Lex Marton Redwood  In an effort to better capture your patient's severity of illness, reflect appropriate length of stay and utilization of resources, a review of the patient medical record has revealed the following indicators.    Based on your clinical judgment, please clarify and document in a progress note and/or discharge summary the clinical condition associated with the following supporting information:  In responding to this query please exercise your independent judgment.  The fact that a query is asked, does not imply that any particular answer is desired or expected.  If possible , please specify for accurate  specificity & severity if "ANEMIA " be further clarified in progress notes as any of listings below.  Possible Clinical Conditions?  " Iron deficiency anemia " Pernicious anemia " Chronic blood loss anemia " Anemia due to chronic disease " Anemia due to malignancy " Aplastic anemia " Acute Blood Loss Anemia " Precipitous drop in Hematocrit   " Other Condition____________  " Cannot Clinically Determine   Supporting Information: Signs and Symptoms:  per  HEME: " has severe diverticulosis and has had recurrent, severe, diverticular  hemorrhage bleeding down to hemoglobin is as low as 4 g! This is further complicated by findings of iron malabsorption. She was receiving intermittent iron infusions in our office in the past."   Diagnostics: Admission Lab  09/21/12 WBC  11.3*   NEUTROABS  9.9*   HGB  8.3*   HCT  27.9*   MCV  72.3*   PLT  643*    Current H&H: after transfusion:    On 09/23/12          HGB 10.4  HCT 33.4  Fe studies:  IRON   results on 09/22/12 <10L                     FERRITIN             09/22/12      8L                     RBC.                                    3.15 L     Treatments: Transfusion: 1 unit RBC on 09/23/12  Serial H&H monitoring     You may use possible, probable, or suspect with inpatient documentation. Possible, probable, suspected diagnoses MUST be documented at the time of discharge.  Reviewed: additional documentation in the medical record                 noted on  D/c summary 09/24/12   Thank  You,  Andy Gauss RN, BSN  Clinical Documentation Specialist:  Pager [phone] Office # [phone]  Health Information Management Esmond Harps

## 2012-09-23 NOTE — Progress Notes (Signed)
Progress Note: Actually  Subjective: Pleasant 78 year old woman I have followed since 2007. She has severe diverticulosis and has had recurrent, severe, diverticular hemorrhage bleeding down to hemoglobin is as low as 4 g! This is further complicated by findings of iron malabsorption. She was receiving intermittent iron infusions in our office in the past.  She was lost to followup due to scheduling problems. She has not been seen since January 2012. I strongly encouraged her to have a partial colectomy. She was evaluated by Dr. Derrell Lolling in the past. She declined the surgery.  In addition to the above, she has had problems with recurrent bowel obstruction related to previous abdominal surgeries including hysterectomy and appendectomy.  She presents at the current time with a 24-48 hour history of abdominal distention, pain, tenesmus, and then inability to have a bowel movement. She is found to have recurrent small bowel obstruction.  Incidentally, labs show recurrent anemia with a hemoglobin of 7 g which is not a surprise. She denies any recent hematochezia. Last hemoglobin on record was 07/02/2011 and was 10 g at that time.     Vitals: Filed Vitals:   09/23/12 0600  BP: 134/77  Pulse: 83  Temp: 97.9 F (36.6 C)  Resp: 18   Wt Readings from Last 3 Encounters:  09/22/12 146 lb 9.7 oz (66.5 kg)  07/01/11 139 lb 15.9 oz (63.5 kg)     PHYSICAL EXAM:  General pleasant elderly woman in NAD Head: Normal Eyes: Normal Throat: No erythema or exudate Neck: Lymph Nodes: Lungs: Clear to auscultation resonant to percussion Breasts:  Cardiac: Regular rhythm no murmur Abdominal: Soft, moderately distended, tympanitic, active bowel sounds, nontender, no mass, no organomegaly Extremities: Vascular:   Neurologic Skin: Pale  Labs:   Recent Labs  09/21/12 2015 09/22/12 0515  WBC 11.3* 6.8  HGB 8.3* 7.0*  HCT 27.9* 23.2*  PLT 643* 543*    Recent Labs  09/21/12 2015  09/22/12 0515  NA 133* 135  K 3.4* 3.1*  CL 95* 99  CO2 24 26  GLUCOSE 129* 141*  BUN 25* 23  CREATININE 0.62 0.56  CALCIUM 8.9 8.1*      Images Studies/Results:   Dg Abd 1 View  09/22/2012  *RADIOLOGY REPORT*  Clinical Data: Follow-up small bowel obstruction.  ABDOMEN - 1 VIEW  Comparison: Acute abdominal series 09/22/2012.  Findings: Gas-filled loops of small bowel are slightly less prominent than on the prior study.  No definite free air is present.  Degenerative scoliosis of the lumbar spine is again noted.  IMPRESSION:  1.  Slight decreased prominence of dilated loops of small bowel compatible with the small bowel obstruction.   Original Report Authenticated By: Marin Roberts, M.D.    Ct Abdomen Pelvis W Contrast  09/21/2012  *RADIOLOGY REPORT*  Clinical Data: 79 year old female with abdominal and pelvic pain. Nausea.  CT ABDOMEN AND PELVIS WITH CONTRAST  Technique:  Multidetector CT imaging of the abdomen and pelvis was performed following the standard protocol during bolus administration of intravenous contrast.  Contrast: 80 ml intravenous Omnipaque-300  Comparison: 07/01/2011 CT  Findings: There are multiple dilated small bowel loops with the distal ileum collapsed - compatible with high-grade small bowel obstruction.  The transition point is identified within the central lower abdomen, but no definite cause identified.  A small amount of ascites is identified within the abdomen and pelvis. There is no evidence of pneumoperitoneum.  A large hiatal hernia is present.  The liver, spleen and pancreas are unremarkable. The  gallbladder is distended. Fullness of both adrenal glands are again noted. Mild bilateral renal cortical thinning and bilateral renal cysts again noted.  There is no evidence of enlarged lymph nodes or abdominal aortic aneurysm. Colonic diverticulosis is noted without evidence of diverticulitis. The patient is status post hysterectomy. No acute or suspicious bony  abnormalities are identified. Degenerative changes throughout the lumbar spine identified.  IMPRESSION: High-grade small bowel obstruction with transition point in the central lower abdomen (likely proximal to mid ileum) - no definite cause and may be secondary to adhesion.  Small amount of ascites. No evidence of pneumoperitoneum.  Large hiatal hernia.   Original Report Authenticated By: Harmon Pier, M.D.    Dg Abd Acute W/chest  09/22/2012  *RADIOLOGY REPORT*  Clinical Data: Small bowel obstruction.  Congestive heart failure.  ACUTE ABDOMEN SERIES (ABDOMEN 2 VIEW & CHEST 1 VIEW)  Comparison: 09/21/2012  Findings: Moderate to large hiatal hernia noted with mild cardiomegaly and blunting left costophrenic angle.  No free peroneal gas noted beneath the hemidiaphragms.  Scattered air-fluid levels are present in the dilated loops of small bowel. These loops measure up to approximately 5 cm.  Contrast medium from prior CT scan noted in the urinary bladder. Lumbar spondylosis is observed.  IMPRESSION:  1.  Small bowel obstruction, with abnormal air-fluid levels in dilated loops of bowel measuring up to 5 cm. 2.  Moderate to large hiatal hernia. 3.  Mild cardiomegaly. 4.  Small left pleural effusion.   Original Report Authenticated By: Gaylyn Rong, M.D.      Patient Active Problem List  Diagnosis  . Diverticulitis  . Hypothyroidism  . Hearing loss  . Normocytic anemia  . Hypokalemia  . SBO (small bowel obstruction)  . Anemia  . Shortness of breath    Assessment and Plan:  #1. Recurrent bowel obstruction Currently being treated with conservative therapy. She has not been vomiting so an NG tube has not been placed. She has been established with [facility]. If her condition deteriorates I would get them involved.  #2. Chronic iron deficiency anemia due to iron malabsorption and recurrent diverticular hemorrhage Recommendation: Transfuse red blood cells 2 stable hemoglobin I would  give 2 units. Prior to hospital discharge, I would give her an iron infusion with Feraheme 1020 mg short IV infusion over 20 minutes.  We will be happy to resume followup in our office after discharge. She is still seeing her primary care physician Dr. Tenny Craw.   Discussed above with hospitalist physician   Nysa Sarin M 09/23/2012, 9:18 AM

## 2012-09-23 NOTE — Progress Notes (Signed)
  Echocardiogram 2D Echocardiogram has been performed.  Ermon Sagan, [facility] 09/23/2012, 10:11 AM

## 2012-09-23 NOTE — Progress Notes (Signed)
[facility] PROGRESS NOTE  [name] ZOX:[id number] DOB: 1926/05/13 DOA: 09/21/2012 PCP: Miguel Aschoff, MD  Assessment/Plan:  Small bowel obstruction  Possibly the setting of previous adhesions.  Has had bowel movements already and is nontender on exam. Repeat abdominal x-ray yesterday still showed some small bowel obstruction. Has good bowel sounds and and benign abdominal exam. Repeat abdominal x-ray today and if improving will start her on diet.  Shortness of breath  Noted on 2/26. ProBNP elevated weight by basilar crackles on exam. X-ray showed a small left pleural effusion. No history of CHF. I will get a 2-D echo. She has been given 2 doses of IV Lasix already. Breathing much better today.  Anemia  Has history of pandiverticulosis and possible diverticular bleed had a colonoscopy in 2011 by [facility]. Discussed with Dr. Cyndie Chime and she followed in the past and she has history off diverticular bleed and iron malabsorption. She was intermittently getting IV infusion in his office previously. She was recommended for partial colectomy however refused in the past. Her hemoglobin was down to 7 this admission and she was given 2 units PRBC with good improvement in hemoglobin. Dr. Cyndie Chime recommends giving her an iron infusion with Feraheme 1020 mg short IV infusion over 20 minutes.   Hypothyroidism  Holding medications as patient is n.p.o.   Hypokalemia  Potassium of 2.8 this morning. Replenish with IV KCl. Magnesium level of 1.8,  will replenish  UTI Recent asymptomatic up with urine culture growing gram-negative rod. Will treat her with a short course of ciprofloxacin. Follow sensitivity.  Code Status: DO NOT RESUSCITATE Family Communication: None at bedside Disposition Plan: Home likely tomorrow if  improved   Consultants:  Dr. Cyndie Chime  Procedures:  None  Antibiotics:  Ciprofloxacin (2/27--)  HPI/Subjective: Patient feels better. Received 2 units PRBC  yesterday with good improvement in her hemoglobin. Denies any abdominal pain and requests to eat.  Objective: Filed Vitals:   09/23/12 0003 09/23/12 0047 09/23/12 0147 09/23/12 0600  BP: 116/67 130/73 132/69 134/77  Pulse: 88 99 79 83  Temp: 98.3 F (36.8 C) 98.2 F (36.8 C) 98.3 F (36.8 C) 97.9 F (36.6 C)  TempSrc: Oral Oral Oral Oral  Resp: 18 18 16 18   Height:      Weight:      SpO2:    94%    Intake/Output Summary (Last 24 hours) at 09/23/12 1150 Last data filed at 09/23/12 8119  Gross per 24 hour  Intake   1400 ml  Output      0 ml  Net   1400 ml   Filed Weights   09/22/12 0110  Weight: 66.5 kg (146 lb 9.7 oz)    Exam:  General: Elderly female lying in bed in no acute distress  HEENT: No pallor, moist oral mucosa  Cardiovascular: Normal S1 and S2, no murmurs rub or gallop  Respiratory: Bilateral equal air entry, by basilar crackles  Abdomen: Distended, nontender, bowel sounds present  Extremities: Warm, no edema  CNS: AAO x3   Data Reviewed: Basic Metabolic Panel:  Recent Labs Lab 09/21/12 2015 09/22/12 0515 09/23/12 0900  NA 133* 135 134*  K 3.4* 3.1* 2.8*  CL 95* 99 95*  CO2 24 26 27   GLUCOSE 129* 141* 90  BUN 25* 23 14  CREATININE 0.62 0.56 0.51  CALCIUM 8.9 8.1* 8.5  MG  --  2.2 1.8   Liver Function Tests:  Recent Labs Lab 09/21/12 2015  AST 18  ALT 6  ALKPHOS 59  BILITOT 0.3  PROT 7.1  ALBUMIN 3.3*    Recent Labs Lab 09/21/12 2015  LIPASE 25   No results found for this basename: AMMONIA,  in the last 168 hours CBC:  Recent Labs Lab 09/21/12 2015 09/22/12 0515 09/23/12 0900  WBC 11.3* 6.8 5.2  NEUTROABS 9.9*  --   --   HGB 8.3* 7.0* 10.4*  HCT 27.9* 23.2* 33.4*  MCV 72.3* 73.0* 74.2*  PLT 643* 543* 648*   Cardiac Enzymes: No results found for this basename: CKTOTAL, CKMB, CKMBINDEX, TROPONINI,  in the last 168 hours BNP (last 3 results)  Recent Labs  09/22/12 0515  PROBNP 1297.0*   CBG: No results found  for this basename: GLUCAP,  in the last 168 hours  Recent Results (from the past 240 hour(s))  URINE CULTURE     Status: None   Collection Time    09/22/12  2:41 AM      Result Value Range Status   Specimen Description URINE, CLEAN CATCH   Final   Special Requests NONE   Final   Culture  Setup Time 09/22/2012 08:56   Final   Colony Count >=100,000 COLONIES/ML   Final   Culture GRAM NEGATIVE RODS   Final   Report Status PENDING   Incomplete     Studies: Dg Abd 1 View  09/22/2012  *RADIOLOGY REPORT*  Clinical Data: Follow-up small bowel obstruction.  ABDOMEN - 1 VIEW  Comparison: Acute abdominal series 09/22/2012.  Findings: Gas-filled loops of small bowel are slightly less prominent than on the prior study.  No definite free air is present.  Degenerative scoliosis of the lumbar spine is again noted.  IMPRESSION:  1.  Slight decreased prominence of dilated loops of small bowel compatible with the small bowel obstruction.   Original Report Authenticated By: Marin Roberts, M.D.    Ct Abdomen Pelvis W Contrast  09/21/2012  *RADIOLOGY REPORT*  Clinical Data: 79 year old female with abdominal and pelvic pain. Nausea.  CT ABDOMEN AND PELVIS WITH CONTRAST  Technique:  Multidetector CT imaging of the abdomen and pelvis was performed following the standard protocol during bolus administration of intravenous contrast.  Contrast: 80 ml intravenous Omnipaque-300  Comparison: 07/01/2011 CT  Findings: There are multiple dilated small bowel loops with the distal ileum collapsed - compatible with high-grade small bowel obstruction.  The transition point is identified within the central lower abdomen, but no definite cause identified.  A small amount of ascites is identified within the abdomen and pelvis. There is no evidence of pneumoperitoneum.  A large hiatal hernia is present.  The liver, spleen and pancreas are unremarkable. The gallbladder is distended. Fullness of both adrenal glands are again noted.  Mild bilateral renal cortical thinning and bilateral renal cysts again noted.  There is no evidence of enlarged lymph nodes or abdominal aortic aneurysm. Colonic diverticulosis is noted without evidence of diverticulitis. The patient is status post hysterectomy. No acute or suspicious bony abnormalities are identified. Degenerative changes throughout the lumbar spine identified.  IMPRESSION: High-grade small bowel obstruction with transition point in the central lower abdomen (likely proximal to mid ileum) - no definite cause and may be secondary to adhesion.  Small amount of ascites. No evidence of pneumoperitoneum.  Large hiatal hernia.   Original Report Authenticated By: Harmon Pier, M.D.    Dg Abd Acute W/chest  09/22/2012  *RADIOLOGY REPORT*  Clinical Data: Small bowel obstruction.  Congestive heart failure.  ACUTE ABDOMEN SERIES (ABDOMEN 2 VIEW &  CHEST 1 VIEW)  Comparison: 09/21/2012  Findings: Moderate to large hiatal hernia noted with mild cardiomegaly and blunting left costophrenic angle.  No free peroneal gas noted beneath the hemidiaphragms.  Scattered air-fluid levels are present in the dilated loops of small bowel. These loops measure up to approximately 5 cm.  Contrast medium from prior CT scan noted in the urinary bladder. Lumbar spondylosis is observed.  IMPRESSION:  1.  Small bowel obstruction, with abnormal air-fluid levels in dilated loops of bowel measuring up to 5 cm. 2.  Moderate to large hiatal hernia. 3.  Mild cardiomegaly. 4.  Small left pleural effusion.   Original Report Authenticated By: Gaylyn Rong, M.D.     Scheduled Meds: . ciprofloxacin  200 mg Intravenous Q12H  . potassium chloride  10 mEq Intravenous Q1 Hr x 4   Continuous Infusions:      Time spent: 25 minutes    Camryn Quesinberry  [facility] Pager [phone]. If 8PM-8AM, please contact night-coverage at [email or web address], password [facility] 09/23/2012, 11:50 AM  LOS: 2 days

## 2012-09-24 DIAGNOSIS — Z8719 Personal history of other diseases of the digestive system: Secondary | ICD-10-CM

## 2012-09-24 LAB — BASIC METABOLIC PANEL
BUN: 8 mg/dL (ref 6–23)
Chloride: 96 mEq/L (ref 96–112)
Creatinine, Ser: 0.54 mg/dL (ref 0.50–1.10)
Glucose, Bld: 89 mg/dL (ref 70–99)
Potassium: 3.1 mEq/L — ABNORMAL LOW (ref 3.5–5.1)

## 2012-09-24 LAB — CBC
HCT: 29 % — ABNORMAL LOW (ref 36.0–46.0)
Hemoglobin: 9.2 g/dL — ABNORMAL LOW (ref 12.0–15.0)
MCH: 23.7 pg — ABNORMAL LOW (ref 26.0–34.0)
MCHC: 31.7 g/dL (ref 30.0–36.0)
RDW: 17.6 % — ABNORMAL HIGH (ref 11.5–15.5)

## 2012-09-24 LAB — URINE CULTURE

## 2012-09-24 MED ORDER — CIPROFLOXACIN HCL 500 MG PO TABS: 250.0000 mg | ORAL_TABLET | Freq: Two times a day (BID) | ORAL | Status: DC

## 2012-09-24 MED ORDER — METOPROLOL TARTRATE 12.5 MG HALF TABLET: 12.5000 mg | ORAL_TABLET | Freq: Two times a day (BID) | ORAL | Status: DC

## 2012-09-24 MED ORDER — POTASSIUM CHLORIDE CRYS ER 20 MEQ PO TBCR
40.0000 meq | EXTENDED_RELEASE_TABLET | Freq: Once | ORAL | Status: AC
  Administered 2012-09-24: 40 meq via ORAL
  Filled 2012-09-24: qty 2

## 2012-09-24 MED ORDER — METOPROLOL TARTRATE 12.5 MG HALF TABLET
12.5000 mg | ORAL_TABLET | Freq: Two times a day (BID) | ORAL | Status: DC
  Administered 2012-09-24: 12.5 mg via ORAL
  Filled 2012-09-24 (×2): qty 1

## 2012-09-24 NOTE — Progress Notes (Signed)
Discharge instructions and summary given to patient with understanding. No computer at home to access [place].  Awaiting ride.  No questions or concerns at this time.

## 2012-09-24 NOTE — Discharge Summary (Addendum)
Physician Discharge Summary  [name]:[id number] DOB: 28-Jan-1926 DOA: 09/21/2012  PCP: Miguel Aschoff, MD  Admit date: 09/21/2012 Discharge date: 09/24/2012  Time spent: 40 minutes  Recommendations for Outpatient Follow-up:  1. Home with outpatient PCP followup. Needs periodic hemoglobin monitoring  Discharge Diagnoses:  Principal Problem:   SBO (small bowel obstruction), possibly secondary to adhesions  Active Problems:   Shortness of breath, likely due to acute diastolic dysfunction   Hypothyroidism   Hypokalemia   Anemia, iron deficiency   History of GI diverticular bleed   UTI   Discharge Condition: Fair  Diet recommendation: Low sodium  Filed Weights   09/22/12 0110  Weight: 66.5 kg (146 lb 9.7 oz)    History of present illness:  Please refer to admission H&P for details but in brief 79 year old female with history of recurrent diverticular bleed, hypothyroidism, history of diverticulitis and abdominal adhesions surgery presented with abdominal pain for a few days with finding of high grade small bowel obstruction.  Hospital Course:  Small bowel obstruction  Possibly the setting of previous adhesions. Patient  kept n.p.o. and followup x-rays showed resolution of the small bowel obstruction. Her abdominal pain and distention has now resolved and patient tolerating advanced diet. Patient stable to discharge and recommend for increase fluid intake and high fiber diet.  Shortness of breath secondary to acute diastolic dysfucntion Noted on 8/11. ProBNP elevated weight by basilar crackles on exam. X-ray showed a small left pleural effusion. No history of CHF. A 2-D echo shows EF of 50-55% with no wall motion abnormality. She may have a mild diastolic dysfunction. She did improve with a few doses of IV Lasix. I will discharge her on small dose of metoprolol.  Anemia  Has history of pandiverticulosis and  diverticular bleed. She had a colonoscopy in 2011 by [facility].  Discussed with Dr. Cyndie Chime home she followed in the past and informed me she has history off diverticular bleed and iron malabsorption. She was intermittently getting IV infusion in his office previously. She was recommended for partial colectomy however refused in the past. Her hemoglobin was down to 7 on day 2  and she was given 2 units PRBC with good improvement in hemoglobin. Iron panel suggested and essentially. Dr. Cyndie Chime recommended  giving her an iron infusion with Feraheme 1020 mg which she received on 2/27. Hemoglobin currently stable at 9.2 and needs to be monitored as outpatient.  Hypothyroidism  Resume home medications  Hypokalemia  Replenished  UTI  Urine culture growing gram-negative rods. Patient asymptomatic. Will treat with 3 day course of ciprofloxacin. Follow sensitivity as outpatient.   Code Status: DO NOT RESUSCITATE   Procedures:  None  Consultations:  Dr. Cyndie Chime  Discharge Exam: Filed Vitals:   09/23/12 1337 09/23/12 2200 09/24/12 0700 09/24/12 0828  BP: 137/78 153/71 163/80 123/74  Pulse: 86 80 83 95  Temp: 97.5 F (36.4 C) 98.3 F (36.8 C) 98.8 F (37.1 C) 97 F (36.1 C)  TempSrc: Oral Oral Oral Oral  Resp: 18 18 18 18   Height:      Weight:      SpO2: 95% 95% 92% 96%    General: Elderly female in no acute distress HEENT: No pallor, moist oral mucosa CVS: Normal S1 and S2, no murmurs rub or gallop Respiratory: Ear to auscultation bilaterally no added sounds Abdomen: Soft, nontender, nondistended, bowel sounds present Extremities: Warm, no edema CNS: AAO x3  Discharge Instructions     Medication List    TAKE  these medications       CAL-MAG-ZINC PO  Take 1 tablet by mouth daily.     ciprofloxacin 500 MG tablet  Commonly known as:  CIPRO  Take 0.5 tablets (250 mg total) by mouth 2 (two) times daily.     HYDROcodone-acetaminophen 5-500 MG per tablet  Commonly known as:  VICODIN  Take 1-2 tablets by mouth every 4  (four) hours as needed for pain. Pain        levothyroxine 112 MCG tablet  Commonly known as:  SYNTHROID, LEVOTHROID  Take 112 mcg by mouth. Take 112 mcg on Tuesday, Wednesday, Thursday, Saturday and Sunday.Take 100 mcg on other days.     levothyroxine 100 MCG tablet  Commonly known as:  SYNTHROID, LEVOTHROID  Take 100 mcg by mouth. Take 100 mcg on Monday and Friday.  Take 112 mcg all other days.     metoprolol tartrate 12.5 mg Tabs  Commonly known as:  LOPRESSOR  Take 0.5 tablets (12.5 mg total) by mouth 2 (two) times daily.     nabumetone 750 MG tablet  Commonly known as:  RELAFEN  Take 750 mg by mouth as needed (as directed.).     polyethylene glycol packet  Commonly known as:  MIRALAX / GLYCOLAX  Take 17 g by mouth daily.     traMADol 50 MG tablet  Commonly known as:  ULTRAM  Take 50-100 mg by mouth. Maximum dose= 8 tablets per day           Follow-up Information   Follow up with [facility], MD In 1 week.   Contact information:   [address] [place] [address] [phone]        The results of significant diagnostics from this hospitalization (including imaging, microbiology, ancillary and laboratory) are listed below for reference.    Significant Diagnostic Studies: Dg Abd 1 View  09/22/2012  *RADIOLOGY REPORT*  Clinical Data: Follow-up small bowel obstruction.  ABDOMEN - 1 VIEW  Comparison: Acute abdominal series 09/22/2012.  Findings: Gas-filled loops of small bowel are slightly less prominent than on the prior study.  No definite free air is present.  Degenerative scoliosis of the lumbar spine is again noted.  IMPRESSION:  1.  Slight decreased prominence of dilated loops of small bowel compatible with the small bowel obstruction.   Original Report Authenticated By: Marin Roberts, M.D.    Ct Abdomen Pelvis W Contrast  09/21/2012  *RADIOLOGY REPORT*  Clinical Data: 79 year old female with abdominal and pelvic pain. Nausea.  CT  ABDOMEN AND PELVIS WITH CONTRAST  Technique:  Multidetector CT imaging of the abdomen and pelvis was performed following the standard protocol during bolus administration of intravenous contrast.  Contrast: 80 ml intravenous Omnipaque-300  Comparison: 07/01/2011 CT  Findings: There are multiple dilated small bowel loops with the distal ileum collapsed - compatible with high-grade small bowel obstruction.  The transition point is identified within the central lower abdomen, but no definite cause identified.  A small amount of ascites is identified within the abdomen and pelvis. There is no evidence of pneumoperitoneum.  A large hiatal hernia is present.  The liver, spleen and pancreas are unremarkable. The gallbladder is distended. Fullness of both adrenal glands are again noted. Mild bilateral renal cortical thinning and bilateral renal cysts again noted.  There is no evidence of enlarged lymph nodes or abdominal aortic aneurysm. Colonic diverticulosis is noted without evidence of diverticulitis. The patient is status post hysterectomy. No acute or suspicious bony abnormalities  are identified. Degenerative changes throughout the lumbar spine identified.  IMPRESSION: High-grade small bowel obstruction with transition point in the central lower abdomen (likely proximal to mid ileum) - no definite cause and may be secondary to adhesion.  Small amount of ascites. No evidence of pneumoperitoneum.  Large hiatal hernia.   Original Report Authenticated By: Harmon Pier, M.D.    Dg Abd Acute W/chest  09/23/2012  *RADIOLOGY REPORT*  Clinical Data: There is a small bowel obstruction.  ACUTE ABDOMEN SERIES (ABDOMEN 2 VIEW & CHEST 1 VIEW)  Comparison: 09/22/2012.  Findings: The upright chest x-ray demonstrates stable cardiac enlargement and moderate sized hiatal hernia.  Stable eventration of the right hemidiaphragm and chronic lung changes.  Two views of the abdomen demonstrate any improved bowel gas pattern with less  distended small bowel.  There does appear to be more air in the colon also.  No free air.  The bony structures are intact.  IMPRESSION: Improving bowel gas pattern with probable resolving small bowel obstruction.   Original Report Authenticated By: Rudie Meyer, M.D.    Dg Abd Acute W/chest  09/22/2012  *RADIOLOGY REPORT*  Clinical Data: Small bowel obstruction.  Congestive heart failure.  ACUTE ABDOMEN SERIES (ABDOMEN 2 VIEW & CHEST 1 VIEW)  Comparison: 09/21/2012  Findings: Moderate to large hiatal hernia noted with mild cardiomegaly and blunting left costophrenic angle.  No free peroneal gas noted beneath the hemidiaphragms.  Scattered air-fluid levels are present in the dilated loops of small bowel. These loops measure up to approximately 5 cm.  Contrast medium from prior CT scan noted in the urinary bladder. Lumbar spondylosis is observed.  IMPRESSION:  1.  Small bowel obstruction, with abnormal air-fluid levels in dilated loops of bowel measuring up to 5 cm. 2.  Moderate to large hiatal hernia. 3.  Mild cardiomegaly. 4.  Small left pleural effusion.   Original Report Authenticated By: Gaylyn Rong, M.D.     Microbiology: Recent Results (from the past 240 hour(s))  URINE CULTURE     Status: None   Collection Time    09/22/12  2:41 AM      Result Value Range Status   Specimen Description URINE, CLEAN CATCH   Final   Special Requests NONE   Final   Culture  Setup Time 09/22/2012 08:56   Final   Colony Count >=100,000 COLONIES/ML   Final   Culture GRAM NEGATIVE RODS   Final   Report Status PENDING   Incomplete     Labs: Basic Metabolic Panel:  Recent Labs Lab 09/21/12 2015 09/22/12 0515 09/23/12 0900 09/24/12 0500  NA 133* 135 134* 133*  K 3.4* 3.1* 2.8* 3.1*  CL 95* 99 95* 96  CO2 24 26 27 29   GLUCOSE 129* 141* 90 89  BUN 25* 23 14 8   CREATININE 0.62 0.56 0.51 0.54  CALCIUM 8.9 8.1* 8.5 8.6  MG  --  2.2 1.8  --    Liver Function Tests:  Recent Labs Lab 09/21/12 2015   AST 18  ALT 6  ALKPHOS 59  BILITOT 0.3  PROT 7.1  ALBUMIN 3.3*    Recent Labs Lab 09/21/12 2015  LIPASE 25   No results found for this basename: AMMONIA,  in the last 168 hours CBC:  Recent Labs Lab 09/21/12 2015 09/22/12 0515 09/23/12 0900 09/24/12 0500  WBC 11.3* 6.8 5.2 6.2  NEUTROABS 9.9*  --   --   --   HGB 8.3* 7.0* 10.4* 9.2*  HCT 27.9* 23.2* 33.4*  29.0*  MCV 72.3* 73.0* 74.2* 74.6*  PLT 643* 543* 648* 580*   Cardiac Enzymes: No results found for this basename: CKTOTAL, CKMB, CKMBINDEX, TROPONINI,  in the last 168 hours BNP: BNP (last 3 results)  Recent Labs  09/22/12 0515  PROBNP 1297.0*   CBG: No results found for this basename: GLUCAP,  in the last 168 hours     Signed:  Abena Erdman  [facility] 09/24/2012, 10:21 AM

## 2013-02-02 DIAGNOSIS — M19049 Primary osteoarthritis, unspecified hand: Secondary | ICD-10-CM | POA: Diagnosis not present

## 2013-02-02 DIAGNOSIS — E039 Hypothyroidism, unspecified: Secondary | ICD-10-CM | POA: Diagnosis not present

## 2013-02-02 DIAGNOSIS — D500 Iron deficiency anemia secondary to blood loss (chronic): Secondary | ICD-10-CM | POA: Diagnosis not present

## 2013-02-02 DIAGNOSIS — Z79899 Other long term (current) drug therapy: Secondary | ICD-10-CM | POA: Diagnosis not present

## 2013-10-06 ENCOUNTER — Emergency Department (HOSPITAL_COMMUNITY): Payer: Medicare Other

## 2013-10-06 ENCOUNTER — Encounter (HOSPITAL_COMMUNITY): Payer: Self-pay | Admitting: Emergency Medicine

## 2013-10-06 ENCOUNTER — Emergency Department (HOSPITAL_COMMUNITY)
Admission: EM | Admit: 2013-10-06 | Discharge: 2013-10-06 | Disposition: A | Discharge status: Home / Self Care | Payer: Medicare Other | Attending: Emergency Medicine | Admitting: Emergency Medicine

## 2013-10-06 DIAGNOSIS — IMO0001 Reserved for inherently not codable concepts without codable children: Secondary | ICD-10-CM | POA: Diagnosis not present

## 2013-10-06 DIAGNOSIS — Z87891 Personal history of nicotine dependence: Secondary | ICD-10-CM | POA: Insufficient documentation

## 2013-10-06 DIAGNOSIS — K2990 Gastroduodenitis, unspecified, without bleeding: Secondary | ICD-10-CM | POA: Diagnosis not present

## 2013-10-06 DIAGNOSIS — Z79899 Other long term (current) drug therapy: Secondary | ICD-10-CM | POA: Diagnosis not present

## 2013-10-06 DIAGNOSIS — Z8719 Personal history of other diseases of the digestive system: Secondary | ICD-10-CM | POA: Diagnosis not present

## 2013-10-06 DIAGNOSIS — R197 Diarrhea, unspecified: Secondary | ICD-10-CM | POA: Diagnosis not present

## 2013-10-06 DIAGNOSIS — R109 Unspecified abdominal pain: Secondary | ICD-10-CM | POA: Insufficient documentation

## 2013-10-06 DIAGNOSIS — K461 Unspecified abdominal hernia with gangrene: Secondary | ICD-10-CM | POA: Diagnosis not present

## 2013-10-06 DIAGNOSIS — E039 Hypothyroidism, unspecified: Secondary | ICD-10-CM | POA: Insufficient documentation

## 2013-10-06 DIAGNOSIS — M1990 Unspecified osteoarthritis, unspecified site: Secondary | ICD-10-CM | POA: Diagnosis not present

## 2013-10-06 DIAGNOSIS — K2970 Gastritis, unspecified, without bleeding: Secondary | ICD-10-CM | POA: Diagnosis not present

## 2013-10-06 LAB — CBC WITH DIFFERENTIAL/PLATELET
BASOS PCT: 0 % (ref 0–1)
Basophils Absolute: 0 10*3/uL (ref 0.0–0.1)
Eosinophils Absolute: 0 10*3/uL (ref 0.0–0.7)
Eosinophils Relative: 0 % (ref 0–5)
HEMATOCRIT: 42.5 % (ref 36.0–46.0)
HEMOGLOBIN: 14.3 g/dL (ref 12.0–15.0)
LYMPHS PCT: 11 % — AB (ref 12–46)
Lymphs Abs: 0.7 10*3/uL (ref 0.7–4.0)
MCH: 31 pg (ref 26.0–34.0)
MCHC: 33.6 g/dL (ref 30.0–36.0)
MCV: 92 fL (ref 78.0–100.0)
MONO ABS: 0.8 10*3/uL (ref 0.1–1.0)
MONOS PCT: 12 % (ref 3–12)
NEUTROS ABS: 4.9 10*3/uL (ref 1.7–7.7)
NEUTROS PCT: 76 % (ref 43–77)
Platelets: 399 10*3/uL (ref 150–400)
RBC: 4.62 MIL/uL (ref 3.87–5.11)
RDW: 15 % (ref 11.5–15.5)
WBC: 6.4 10*3/uL (ref 4.0–10.5)

## 2013-10-06 LAB — COMPREHENSIVE METABOLIC PANEL
ALBUMIN: 4.1 g/dL (ref 3.5–5.2)
ALK PHOS: 58 U/L (ref 39–117)
ALT: 10 U/L (ref 0–35)
AST: 25 U/L (ref 0–37)
BILIRUBIN TOTAL: 1.2 mg/dL (ref 0.3–1.2)
BUN: 28 mg/dL — AB (ref 6–23)
CHLORIDE: 97 meq/L (ref 96–112)
CO2: 22 meq/L (ref 19–32)
Calcium: 9.7 mg/dL (ref 8.4–10.5)
Creatinine, Ser: 0.66 mg/dL (ref 0.50–1.10)
GFR calc Af Amer: >90 mL/min (ref >90)
GFR, EST NON AFRICAN AMERICAN: 78 mL/min — AB (ref >90)
Glucose, Bld: 98 mg/dL (ref 70–99)
POTASSIUM: 3.9 meq/L (ref 3.7–5.3)
Sodium: 137 mEq/L (ref 137–147)
Total Protein: 7.3 g/dL (ref 6.0–8.3)

## 2013-10-06 MED ORDER — IOHEXOL 300 MG/ML  SOLN
50.0000 mL | Freq: Once | INTRAMUSCULAR | Status: AC | PRN
  Administered 2013-10-06: 50 mL via ORAL

## 2013-10-06 MED ORDER — IOHEXOL 300 MG/ML  SOLN
100.0000 mL | Freq: Once | INTRAMUSCULAR | Status: AC | PRN
  Administered 2013-10-06: 100 mL via INTRAVENOUS

## 2013-10-06 NOTE — ED Notes (Signed)
Pt reports diarrhea since yesterday, c/o upper abd pain, feels as though upper abd is "rigid", denies n/v, blood in stool, fever.

## 2013-10-06 NOTE — Discharge Instructions (Signed)
Diarrhea Diarrhea is watery poop (stool). It can make you feel weak, tired, thirsty, or give you a dry mouth (signs of dehydration). Watery poop is a sign of another problem, most often an infection. It often lasts 2 3 days. It can last longer if it is a sign of something serious. Take care of yourself as told by your doctor. HOME CARE   Drink 1 cup (8 ounces) of fluid each time you have watery poop.  Do not drink the following fluids:  Those that contain simple sugars (fructose, glucose, galactose, lactose, sucrose, maltose).  Sports drinks.  Fruit juices.  Whole milk products.  Sodas.  Drinks with caffeine (coffee, tea, soda) or alcohol.  Oral rehydration solution may be used if the doctor says it is okay. You may make your own solution. Follow this recipe:    teaspoon table salt.   teaspoon baking soda.   teaspoon salt substitute containing potassium chloride.  1 tablespoons sugar.  1 liter (34 ounces) of water.  Avoid the following foods:  High fiber foods, such as raw fruits and vegetables.  Nuts, seeds, and whole grain breads and cereals.   Those that are sweetened with sugar alcohols (xylitol, sorbitol, mannitol).  Try eating the following foods:  Starchy foods, such as rice, toast, pasta, low-sugar cereal, oatmeal, baked potatoes, crackers, and bagels.  Bananas.  Applesauce.  Eat probiotic-rich foods, such as yogurt and milk products that are fermented.  Wash your hands well after each time you have watery poop.  Only take medicine as told by your doctor.  Take a warm bath to help lessen burning or pain from having watery poop. GET HELP RIGHT AWAY IF:   You cannot drink fluids without throwing up (vomiting).  You keep throwing up.  You have blood in your poop, or your poop looks black and tarry.  You do not pee (urinate) in 6 8 hours, or there is only a small amount of very dark pee.  You have belly (abdominal) pain that gets worse or stays in  the same spot (localizes).  You are weak, dizzy, confused, or lightheaded.  You have a very bad headache.  Your watery poop gets worse or does not get better.  You have a fever or lasting symptoms for more than 2 3 days.  You have a fever and your symptoms suddenly get worse. MAKE SURE YOU:   Understand these instructions.  Will watch your condition.  Will get help right away if you are not doing well or get worse. Document Released: 12/31/2007 Document Revised: 04/07/2012 Document Reviewed: 03/21/2012 [employer] Patient Information 2014 [employer].  

## 2013-10-06 NOTE — ED Provider Notes (Signed)
CSN: [id number]     Arrival date & time 10/06/13  1127 History   First MD Initiated Contact with Patient 10/06/13 1302     Chief Complaint  Patient presents with  . Diarrhea     (Consider location/radiation/quality/duration/timing/severity/associated sxs/prior Treatment) HPI Comments: Patient is an 80 year old female with history of diverticulitis, fibromyalgia, hypothyroidism, osteoarthritis who presents today with abdominal pain and diarrhea. She reports this began yesterday. Her diarrhea has been watery and nonbloody. She denies any nausea or vomiting. Her abdominal pain is crampy, but is resolved with having diarrhea. She feels as though her abdomen has been distended since yesterday. She denies shortness of breath, chest pain, fevers, chills. She denies any recent travel or abnormal food intake. No recent antibiotics.   Patient is a 80 y.o. female presenting with diarrhea. The history is provided by the patient. No language interpreter was used.  Diarrhea Associated symptoms: abdominal pain   Associated symptoms: no chills, no fever and no vomiting     Past Medical History  Diagnosis Date  . Diverticulitis   . Arthritis   . Fibromyalgia   . Diverticulosis   . Hypothyroidism   . Osteoarthritis   . DJD (degenerative joint disease)    Past Surgical History  Procedure Laterality Date  . Ectopic pregnancy surgery    . Abdominal hysterectomy    . Abdominal adhesion surgery    . Knee arthroplasty     Family History  Problem Relation Age of Onset  . Prostate cancer     History  Substance Use Topics  . Smoking status: Former Smoker    Types: Cigarettes    Quit date: 06/30/1984  . Smokeless tobacco: Never Used  . Alcohol Use: No   OB History   Grav Para Term Preterm Abortions TAB SAB Ect Mult Living                 Review of Systems  Constitutional: Negative for fever and chills.  Respiratory: Negative for shortness of breath.   Cardiovascular: Negative for chest  pain.  Gastrointestinal: Positive for abdominal pain and diarrhea. Negative for nausea, vomiting and blood in stool.  All other systems reviewed and are negative.      Allergies  Antihistamines, chlorpheniramine-type and Codeine  Home Medications   Current Outpatient Rx  Name  Route  Sig  Dispense  Refill  . Calcium-Magnesium-Zinc (CAL-MAG-ZINC PO)   Oral   Take 1 tablet by mouth daily.         Marland Kitchen levothyroxine (SYNTHROID, LEVOTHROID) 100 MCG tablet   Oral   Take 100 mcg by mouth. Take 100 mcg on Monday and Friday.  Take 112 mcg all other days.         Marland Kitchen levothyroxine (SYNTHROID, LEVOTHROID) 112 MCG tablet   Oral   Take 112 mcg by mouth. Take 112 mcg on Tuesday, Wednesday, Thursday, Saturday and Sunday.Take 100 mcg on other days.         . nabumetone (RELAFEN) 750 MG tablet   Oral   Take 750 mg by mouth as needed (as directed.).          Marland Kitchen polyethylene glycol (MIRALAX / GLYCOLAX) packet   Oral   Take 17 g by mouth daily as needed for mild constipation.          . traMADol (ULTRAM) 50 MG tablet   Oral   Take 50-100 mg by mouth. Maximum dose= 8 tablets per day  BP 141/66  Pulse 89  Temp(Src) 98.4 F (36.9 C) (Oral)  Resp 12  SpO2 96% Physical Exam  Nursing note and vitals reviewed. Constitutional: She is oriented to person, place, and time. She appears well-developed and well-nourished.  Non-toxic appearance. She does not have a sickly appearance. She does not appear ill. No distress.  HENT:  Head: Normocephalic and atraumatic.  Right Ear: External ear normal.  Left Ear: External ear normal.  Nose: Nose normal.  Mouth/Throat: Oropharynx is clear and moist.  Eyes: Conjunctivae are normal.  Neck: Normal range of motion.  No nuchal rigidity or meningeal signs  Cardiovascular: Normal rate, regular rhythm and normal heart sounds.   Pulmonary/Chest: Effort normal and breath sounds normal. No stridor. No respiratory distress. She has no wheezes.  She has no rales.  Abdominal: Soft. Bowel sounds are normal. She exhibits no distension. There is no tenderness. There is no rigidity, no rebound and no guarding.  No tenderness to palpation of abd  Musculoskeletal: Normal range of motion.  Neurological: She is alert and oriented to person, place, and time. She has normal strength.  Skin: Skin is warm and dry. She is not diaphoretic. No erythema.  Psychiatric: She has a normal mood and affect. Her behavior is normal.    ED Course  Procedures (including critical care time) Labs Review Labs Reviewed  CBC WITH DIFFERENTIAL - Abnormal; Notable for the following:    Lymphocytes Relative 11 (*)    All other components within normal limits  COMPREHENSIVE METABOLIC PANEL - Abnormal; Notable for the following:    BUN 28 (*)    GFR calc non Af Amer 78 (*)    All other components within normal limits  URINE CULTURE   Imaging Review Ct Abdomen Pelvis W Contrast  10/06/2013   ADDENDUM REPORT: 10/06/2013 15:36  ADDENDUM: Corrected Impression:  IMPRESSION: 1. Large hiatal hernia 2. Findings consistent with colonic ileus AND diarrhea illness. 3. Numerous nonacute findings described above.   Electronically Signed   By: Esperanza Heiraymond  Rubner M.D.   On: 10/06/2013 15:36   10/06/2013   CLINICAL DATA:  Diarrhea for 1 day, upper abdominal pain  EXAM: CT ABDOMEN AND PELVIS WITH CONTRAST  TECHNIQUE: Multidetector CT imaging of the abdomen and pelvis was performed using the standard protocol following bolus administration of intravenous contrast.  CONTRAST:  100mL OMNIPAQUE IOHEXOL 300 MG/ML  SOLN  COMPARISON:  DG ABD ACUTE W/CHEST dated 09/23/2012; DG ABDOMEN 1V dated 09/22/2012; CT ABD/PELVIS W CM dated 09/21/2012  FINDINGS: The visualized portions of the lung bases are clear. There is a large hiatal hernia, similar to the prior study.  There is mild intrahepatic biliary dilatation, unchanged. There are no focal hepatic abnormalities. Spleen is diminutive but otherwise  normal. Pancreas is normal. There is mild fullness of the right adrenal gland, unchanged. There is mild fullness of the left adrenal gland, also unchanged.  Left renal cysts are stable and benign in appearance. Right kidney is normal. Abdominal aorta shows calcification.  The colon is diffusely fluid filled down to the rectum, were there is an air-fluid level. There is moderate  Bladder is normal. Reproductive organs not identified and presumably are absent. Diverticulosis of the sigmoid colon with no evidence of diverticulitis. Alternating zones of colon show normal caliber and distention. Splenic flexure is 7 cm in diameter, which is the most distended portion of colon. The colon appears redundant and fluid-filled, with mild diverticulosis seen throughout the colon  IMPRESSION: 1. Large hiatal hernia 2.  Findings consistent with colonic ileus can't diarrhea illness. 3. Numerous nonacute findings described above.  Electronically Signed: By: Esperanza Heir M.D. On: 10/06/2013 15:06     EKG Interpretation None      MDM   Final diagnoses:  Diarrhea   Patient is nontoxic, nonseptic appearing, in no apparent distress.  Patient's pain and other symptoms adequately managed in emergency department. Labs, imaging and vitals reviewed. UA was cancelled as a clean specimen could not be obtained. Pt kept having diarrhea into urine hat. Staff attempted in and out cath x2 which was unsuccessful. Patient with no urinary complaints and I feel as though the risk outweighs the benefit to obtain sample. Patient does not meet the SIRS or Sepsis criteria.  On repeat exam patient does not have a surgical abdomen and there are no peritoneal signs.  No indication of appendicitis, bowel obstruction, bowel perforation, cholecystitis, diverticulitis.  Patient discharged home with symptomatic treatment and given strict instructions for follow-up with their primary care physician.  I have also discussed reasons to return  immediately to the ER.  Patient expresses understanding and agrees with plan. Dr. Loretha Stapler evaluted patient and agrees with plan.     Mora Bellman, PA-C 10/07/13 1840

## 2013-10-06 NOTE — ED Notes (Signed)
Attempted to do in and out  Twice and was not successful

## 2013-10-06 NOTE — ED Notes (Signed)
Bed: WA22 Expected date:  Expected time:  Means of arrival:  Comments: ems- abdominal pain 

## 2013-10-06 NOTE — ED Notes (Signed)
Pt has been asked to urinate, pt is unable to urinate at this time. Will ask again in 30 minutes.

## 2013-10-07 NOTE — ED Provider Notes (Signed)
Medical screening examination/treatment/procedure(s) were conducted as a shared visit with non-physician practitioner(s) and myself.  I personally evaluated the patient during the encounter.   EKG Interpretation None      80 yo female with diarrhea and abdominal pain.  CT without signs of diverticulitis, colitis, or SBO.  On my exam, well appearing, nontoxic, not dehydrated.  Abd soft and nontender.  She reported feeling much better.  She desired to go home and said she would follow up with her doctor in a day or two.  Tolerating PO intake without difficulty.  Return precautions given.    Clinical Impression: 1. Diarrhea       Candyce ChurnJohn David Jacci Ruberg III, MD 10/07/13 [phone]

## 2013-10-09 NOTE — ED Provider Notes (Signed)
Medical screening examination/treatment/procedure(s) were conducted as a shared visit with non-physician practitioner(s) and myself.  I personally evaluated the patient during the encounter.   Please see my separate note.     Candyce ChurnJohn David Ector Laurel III, MD 10/09/13 [phone]

## 2013-11-29 DIAGNOSIS — S81009A Unspecified open wound, unspecified knee, initial encounter: Secondary | ICD-10-CM | POA: Diagnosis not present

## 2013-11-29 DIAGNOSIS — S81809A Unspecified open wound, unspecified lower leg, initial encounter: Secondary | ICD-10-CM | POA: Diagnosis not present

## 2013-11-30 DIAGNOSIS — T148XXA Other injury of unspecified body region, initial encounter: Secondary | ICD-10-CM | POA: Diagnosis not present

## 2013-12-06 DIAGNOSIS — T148XXA Other injury of unspecified body region, initial encounter: Secondary | ICD-10-CM | POA: Diagnosis not present

## 2014-01-05 DIAGNOSIS — L03119 Cellulitis of unspecified part of limb: Secondary | ICD-10-CM | POA: Diagnosis not present

## 2014-01-05 DIAGNOSIS — L02419 Cutaneous abscess of limb, unspecified: Secondary | ICD-10-CM | POA: Diagnosis not present

## 2014-01-19 ENCOUNTER — Encounter (HOSPITAL_BASED_OUTPATIENT_CLINIC_OR_DEPARTMENT_OTHER): Payer: Medicare Other | Attending: Internal Medicine

## 2014-01-19 DIAGNOSIS — L97809 Non-pressure chronic ulcer of other part of unspecified lower leg with unspecified severity: Secondary | ICD-10-CM | POA: Diagnosis not present

## 2014-01-19 DIAGNOSIS — L02419 Cutaneous abscess of limb, unspecified: Secondary | ICD-10-CM | POA: Diagnosis not present

## 2014-01-19 DIAGNOSIS — L03119 Cellulitis of unspecified part of limb: Secondary | ICD-10-CM

## 2014-01-25 ENCOUNTER — Encounter (HOSPITAL_BASED_OUTPATIENT_CLINIC_OR_DEPARTMENT_OTHER): Payer: Medicare Other | Attending: General Surgery

## 2014-01-25 DIAGNOSIS — IMO0001 Reserved for inherently not codable concepts without codable children: Secondary | ICD-10-CM | POA: Insufficient documentation

## 2014-01-25 DIAGNOSIS — S81009A Unspecified open wound, unspecified knee, initial encounter: Secondary | ICD-10-CM | POA: Insufficient documentation

## 2014-01-25 DIAGNOSIS — S91009A Unspecified open wound, unspecified ankle, initial encounter: Principal | ICD-10-CM

## 2014-01-25 DIAGNOSIS — S81809A Unspecified open wound, unspecified lower leg, initial encounter: Principal | ICD-10-CM

## 2014-02-14 DIAGNOSIS — IMO0001 Reserved for inherently not codable concepts without codable children: Secondary | ICD-10-CM | POA: Diagnosis not present

## 2014-02-14 DIAGNOSIS — S81809A Unspecified open wound, unspecified lower leg, initial encounter: Secondary | ICD-10-CM | POA: Diagnosis not present

## 2014-02-14 DIAGNOSIS — S81009A Unspecified open wound, unspecified knee, initial encounter: Secondary | ICD-10-CM | POA: Diagnosis not present

## 2014-03-09 ENCOUNTER — Encounter (HOSPITAL_COMMUNITY): Payer: Self-pay | Admitting: Emergency Medicine

## 2014-03-09 ENCOUNTER — Emergency Department (HOSPITAL_COMMUNITY)
Admission: EM | Admit: 2014-03-09 | Discharge: 2014-03-09 | Disposition: A | Discharge status: Home / Self Care | Payer: Medicare Other | Attending: Emergency Medicine | Admitting: Emergency Medicine

## 2014-03-09 DIAGNOSIS — Z90710 Acquired absence of both cervix and uterus: Secondary | ICD-10-CM | POA: Insufficient documentation

## 2014-03-09 DIAGNOSIS — R109 Unspecified abdominal pain: Secondary | ICD-10-CM | POA: Insufficient documentation

## 2014-03-09 DIAGNOSIS — Z9889 Other specified postprocedural states: Secondary | ICD-10-CM | POA: Diagnosis not present

## 2014-03-09 DIAGNOSIS — R143 Flatulence: Secondary | ICD-10-CM | POA: Diagnosis not present

## 2014-03-09 DIAGNOSIS — R141 Gas pain: Secondary | ICD-10-CM | POA: Diagnosis not present

## 2014-03-09 DIAGNOSIS — E039 Hypothyroidism, unspecified: Secondary | ICD-10-CM | POA: Insufficient documentation

## 2014-03-09 DIAGNOSIS — Z8719 Personal history of other diseases of the digestive system: Secondary | ICD-10-CM | POA: Insufficient documentation

## 2014-03-09 DIAGNOSIS — K2990 Gastroduodenitis, unspecified, without bleeding: Secondary | ICD-10-CM | POA: Diagnosis not present

## 2014-03-09 DIAGNOSIS — M1990 Unspecified osteoarthritis, unspecified site: Secondary | ICD-10-CM | POA: Insufficient documentation

## 2014-03-09 DIAGNOSIS — Z87891 Personal history of nicotine dependence: Secondary | ICD-10-CM | POA: Diagnosis not present

## 2014-03-09 DIAGNOSIS — R10819 Abdominal tenderness, unspecified site: Secondary | ICD-10-CM | POA: Diagnosis not present

## 2014-03-09 DIAGNOSIS — K2970 Gastritis, unspecified, without bleeding: Secondary | ICD-10-CM | POA: Diagnosis not present

## 2014-03-09 DIAGNOSIS — R197 Diarrhea, unspecified: Secondary | ICD-10-CM

## 2014-03-09 DIAGNOSIS — Z791 Long term (current) use of non-steroidal anti-inflammatories (NSAID): Secondary | ICD-10-CM | POA: Diagnosis not present

## 2014-03-09 LAB — CBC WITH DIFFERENTIAL/PLATELET
BASOS ABS: 0 10*3/uL (ref 0.0–0.1)
Basophils Relative: 0 % (ref 0–1)
EOS ABS: 0.1 10*3/uL (ref 0.0–0.7)
EOS PCT: 1 % (ref 0–5)
HCT: 43.2 % (ref 36.0–46.0)
Hemoglobin: 14.6 g/dL (ref 12.0–15.0)
Lymphocytes Relative: 11 % — ABNORMAL LOW (ref 12–46)
Lymphs Abs: 0.8 10*3/uL (ref 0.7–4.0)
MCH: 30.2 pg (ref 26.0–34.0)
MCHC: 33.8 g/dL (ref 30.0–36.0)
MCV: 89.3 fL (ref 78.0–100.0)
Monocytes Absolute: 1 10*3/uL (ref 0.1–1.0)
Monocytes Relative: 13 % — ABNORMAL HIGH (ref 3–12)
NEUTROS PCT: 75 % (ref 43–77)
Neutro Abs: 5.6 10*3/uL (ref 1.7–7.7)
PLATELETS: 428 10*3/uL — AB (ref 150–400)
RBC: 4.84 MIL/uL (ref 3.87–5.11)
RDW: 14.5 % (ref 11.5–15.5)
WBC: 7.5 10*3/uL (ref 4.0–10.5)

## 2014-03-09 LAB — COMPREHENSIVE METABOLIC PANEL
ALBUMIN: 4 g/dL (ref 3.5–5.2)
ALT: 13 U/L (ref 0–35)
AST: 26 U/L (ref 0–37)
Alkaline Phosphatase: 65 U/L (ref 39–117)
Anion gap: 16 — ABNORMAL HIGH (ref 5–15)
BUN: 26 mg/dL — ABNORMAL HIGH (ref 6–23)
CALCIUM: 10.3 mg/dL (ref 8.4–10.5)
CO2: 24 mEq/L (ref 19–32)
Chloride: 94 mEq/L — ABNORMAL LOW (ref 96–112)
Creatinine, Ser: 0.82 mg/dL (ref 0.50–1.10)
GFR calc Af Amer: 73 mL/min — ABNORMAL LOW (ref >90)
GFR calc non Af Amer: 63 mL/min — ABNORMAL LOW (ref >90)
Glucose, Bld: 112 mg/dL — ABNORMAL HIGH (ref 70–99)
Potassium: 4.2 mEq/L (ref 3.7–5.3)
SODIUM: 134 meq/L — AB (ref 137–147)
TOTAL PROTEIN: 7.5 g/dL (ref 6.0–8.3)
Total Bilirubin: 1.1 mg/dL (ref 0.3–1.2)

## 2014-03-09 LAB — I-STAT CG4 LACTIC ACID, ED: LACTIC ACID, VENOUS: 0.85 mmol/L (ref 0.5–2.2)

## 2014-03-09 LAB — LIPASE, BLOOD: Lipase: 36 U/L (ref 11–59)

## 2014-03-09 NOTE — Discharge Instructions (Signed)
°  SEEK IMMEDIATE MEDICAL ATTENTION IF: Abdominal pain becomes severe, particularly over the next 8-12 hours.  A temperature above 100.51F develops.  Repeated vomiting occurs (multiple episodes).  The pain becomes localized to portions of the abdomen. The right side could possibly be appendicitis. In an adult, the left lower portion of the abdomen could be colitis or diverticulitis.  Blood is being passed in stools or vomit (bright red or black tarry stools).  Return also if you develop chest pain, difficulty breathing, dizziness or fainting, or become confused, poorly responsive, or inconsolable.

## 2014-03-09 NOTE — ED Notes (Signed)
lACTIC acID given to Dr. Bebe ShaggyWickline.

## 2014-03-09 NOTE — ED Provider Notes (Signed)
CSN: [id number]     Arrival date & time 03/09/14  1909 History   First MD Initiated Contact with Patient 03/09/14 1933     Chief Complaint  Patient presents with  . Diarrhea  . Abdominal Pain      Patient is a 80 y.o. female presenting with diarrhea and abdominal pain. The history is provided by the patient.  Diarrhea Quality:  Watery Severity:  Moderate Onset quality:  Gradual Duration:  1 day Timing:  Intermittent Progression:  Worsening Relieved by:  Nothing Worsened by:  Nothing tried Associated symptoms: abdominal pain   Associated symptoms: no recent cough, no fever and no vomiting   Risk factors: no recent antibiotic use   Abdominal Pain Associated symptoms: diarrhea   Associated symptoms: no chest pain, no fever, no shortness of breath and no vomiting   Pt reports diarrhea for one day It is nonbloody She also reports abdominal pain No fever   She also reports bilateral LE swelling for "awhile" She denies CP/SOB   Past Medical History  Diagnosis Date  . Diverticulitis   . Arthritis   . Fibromyalgia   . Diverticulosis   . Hypothyroidism   . Osteoarthritis   . DJD (degenerative joint disease)    Past Surgical History  Procedure Laterality Date  . Ectopic pregnancy surgery    . Abdominal hysterectomy    . Abdominal adhesion surgery    . Knee arthroplasty     Family History  Problem Relation Age of Onset  . Prostate cancer     History  Substance Use Topics  . Smoking status: Former Smoker    Types: Cigarettes    Quit date: 06/30/1984  . Smokeless tobacco: Never Used  . Alcohol Use: No   OB History   Grav Para Term Preterm Abortions TAB SAB Ect Mult Living                 Review of Systems  Constitutional: Negative for fever.  Respiratory: Negative for shortness of breath.   Cardiovascular: Negative for chest pain.  Gastrointestinal: Positive for abdominal pain and diarrhea. Negative for vomiting and blood in stool.  All other systems  reviewed and are negative.     Allergies  Antihistamines, chlorpheniramine-type and Codeine  Home Medications   Prior to Admission medications   Medication Sig Start Date End Date Taking? Authorizing Provider  levothyroxine (SYNTHROID, LEVOTHROID) 100 MCG tablet Take 100 mcg by mouth. Take 100 mcg on Monday and Friday.  Take 112 mcg all other days.   Yes Historical Provider, MD  levothyroxine (SYNTHROID, LEVOTHROID) 112 MCG tablet Take 112 mcg by mouth. Take 112 mcg on Tuesday, Wednesday, Thursday, Saturday and Sunday.Take 100 mcg on other days.   Yes Historical Provider, MD  loperamide (IMODIUM A-D) 2 MG tablet Take 2 mg by mouth 4 (four) times daily as needed for diarrhea or loose stools.   Yes Historical Provider, MD  nabumetone (RELAFEN) 750 MG tablet Take 750 mg by mouth as needed for mild pain (as directed.).    Yes Historical Provider, MD  polyethylene glycol (MIRALAX / GLYCOLAX) packet Take 17 g by mouth daily as needed for mild constipation.    Yes Historical Provider, MD  traMADol (ULTRAM) 50 MG tablet Take 50-100 mg by mouth. Maximum dose= 8 tablets per day   Yes Historical Provider, MD   BP 131/82  Pulse 97  Temp(Src) 97.9 F (36.6 C) (Oral)  Resp 16  SpO2 94% Physical Exam CONSTITUTIONAL: Well developed/well nourished  HEAD: Normocephalic/atraumatic EYES: EOMI ENMT: Mucous membranes moist NECK: supple no meningeal signs CV: S1/S2 noted, no murmurs/rubs/gallops noted LUNGS: Lungs are clear to auscultation bilaterally, no apparent distress ABDOMEN: abd is protuberant to palpation but there is no focal tenderness noted GU:no cva tenderness NEURO: Pt is awake/alert, moves all extremitiesx4 EXTREMITIES: pulses normal, full ROM. Symmetric pitting edema to bilateral LE SKIN: warm, color normal PSYCH: no abnormalities of mood noted  ED Course  Procedures   8:16 PM Pt with reported diarrhea and abd pain for past day She is well appearing, no distress and no focal abd  tenderness (reports were she had rigid abdomen but no signs of surgical abdomen) She is very talkative and watching TV Labs pending  At time of discharge, pt well appearing, no distress, watching TV, talkative She has no focal tenderness She does have protuberant abdomen, but no focal tenderness and it is not rigid and no signs of surgical abdomen She denies nausea/vomiting She reports loose BM just prior to arrival (none in the ED) I doubt acute bowel obstruction No LLQ tenderness to suggest diverticulitis Family at bedside with patient and we discussed strict return precautions  Labs Review Labs Reviewed  CBC WITH DIFFERENTIAL - Abnormal; Notable for the following:    Platelets 428 (*)    Lymphocytes Relative 11 (*)    Monocytes Relative 13 (*)    All other components within normal limits  COMPREHENSIVE METABOLIC PANEL - Abnormal; Notable for the following:    Sodium 134 (*)    Chloride 94 (*)    Glucose, Bld 112 (*)    BUN 26 (*)    GFR calc non Af Amer 63 (*)    GFR calc Af Amer 73 (*)    Anion gap 16 (*)    All other components within normal limits  LIPASE, BLOOD  I-STAT CG4 LACTIC ACID, ED      MDM   Final diagnoses:  Diarrhea    Nursing notes including past medical history and social history reviewed and considered in documentation Labs/vital reviewed and considered     Joya Gaskinsonald W Amelianna Meller, MD 03/09/14 2220

## 2014-03-09 NOTE — ED Notes (Signed)
Patient is alert and oriented x3.  She was given DC instructions and follow up visit instructions.  Patient gave verbal understanding. She was DC ambulatory under her own power to home.  V/S stable.  He was not showing any signs of distress on DC 

## 2014-03-09 NOTE — ED Notes (Signed)
Bed: WA08 Expected date:  Expected time:  Means of arrival:  Comments: Hold per CN

## 2014-03-09 NOTE — ED Notes (Signed)
Per EMS, pt has had constant diarrhea and a painful, rigid abdomen for the past 2 days. Pt's health care POA states the pt has also had swelling in her legs for the past 2 weeks.

## 2014-03-22 ENCOUNTER — Encounter (HOSPITAL_COMMUNITY): Payer: Self-pay | Admitting: Emergency Medicine

## 2014-03-22 ENCOUNTER — Emergency Department (HOSPITAL_COMMUNITY)
Admission: EM | Admit: 2014-03-22 | Discharge: 2014-03-22 | Disposition: A | Discharge status: Home / Self Care | Payer: Medicare Other | Attending: Emergency Medicine | Admitting: Emergency Medicine

## 2014-03-22 ENCOUNTER — Emergency Department (HOSPITAL_COMMUNITY): Payer: Medicare Other

## 2014-03-22 DIAGNOSIS — I10 Essential (primary) hypertension: Secondary | ICD-10-CM | POA: Diagnosis not present

## 2014-03-22 DIAGNOSIS — K449 Diaphragmatic hernia without obstruction or gangrene: Secondary | ICD-10-CM | POA: Diagnosis not present

## 2014-03-22 DIAGNOSIS — IMO0001 Reserved for inherently not codable concepts without codable children: Secondary | ICD-10-CM | POA: Insufficient documentation

## 2014-03-22 DIAGNOSIS — Z87891 Personal history of nicotine dependence: Secondary | ICD-10-CM | POA: Diagnosis not present

## 2014-03-22 DIAGNOSIS — R011 Cardiac murmur, unspecified: Secondary | ICD-10-CM | POA: Insufficient documentation

## 2014-03-22 DIAGNOSIS — M7989 Other specified soft tissue disorders: Secondary | ICD-10-CM | POA: Insufficient documentation

## 2014-03-22 DIAGNOSIS — Z8719 Personal history of other diseases of the digestive system: Secondary | ICD-10-CM | POA: Insufficient documentation

## 2014-03-22 DIAGNOSIS — J438 Other emphysema: Secondary | ICD-10-CM | POA: Diagnosis not present

## 2014-03-22 DIAGNOSIS — E039 Hypothyroidism, unspecified: Secondary | ICD-10-CM | POA: Diagnosis not present

## 2014-03-22 DIAGNOSIS — R0989 Other specified symptoms and signs involving the circulatory and respiratory systems: Secondary | ICD-10-CM | POA: Diagnosis not present

## 2014-03-22 DIAGNOSIS — E119 Type 2 diabetes mellitus without complications: Secondary | ICD-10-CM | POA: Insufficient documentation

## 2014-03-22 DIAGNOSIS — N390 Urinary tract infection, site not specified: Secondary | ICD-10-CM | POA: Insufficient documentation

## 2014-03-22 DIAGNOSIS — R609 Edema, unspecified: Secondary | ICD-10-CM | POA: Insufficient documentation

## 2014-03-22 DIAGNOSIS — R600 Localized edema: Secondary | ICD-10-CM

## 2014-03-22 DIAGNOSIS — M1990 Unspecified osteoarthritis, unspecified site: Secondary | ICD-10-CM | POA: Diagnosis not present

## 2014-03-22 LAB — CBC WITH DIFFERENTIAL/PLATELET
BASOS ABS: 0 10*3/uL (ref 0.0–0.1)
Basophils Relative: 1 % (ref 0–1)
Eosinophils Absolute: 0.1 10*3/uL (ref 0.0–0.7)
Eosinophils Relative: 1 % (ref 0–5)
HEMATOCRIT: 40.8 % (ref 36.0–46.0)
HEMOGLOBIN: 13.6 g/dL (ref 12.0–15.0)
LYMPHS ABS: 1.3 10*3/uL (ref 0.7–4.0)
LYMPHS PCT: 15 % (ref 12–46)
MCH: 30.1 pg (ref 26.0–34.0)
MCHC: 33.3 g/dL (ref 30.0–36.0)
MCV: 90.3 fL (ref 78.0–100.0)
MONO ABS: 1 10*3/uL (ref 0.1–1.0)
MONOS PCT: 11 % (ref 3–12)
NEUTROS ABS: 6 10*3/uL (ref 1.7–7.7)
Neutrophils Relative %: 72 % (ref 43–77)
Platelets: 411 10*3/uL — ABNORMAL HIGH (ref 150–400)
RBC: 4.52 MIL/uL (ref 3.87–5.11)
RDW: 14.7 % (ref 11.5–15.5)
WBC: 8.4 10*3/uL (ref 4.0–10.5)

## 2014-03-22 LAB — BASIC METABOLIC PANEL
ANION GAP: 14 (ref 5–15)
BUN: 16 mg/dL (ref 6–23)
CHLORIDE: 91 meq/L — AB (ref 96–112)
CO2: 28 meq/L (ref 19–32)
CREATININE: 0.64 mg/dL (ref 0.50–1.10)
Calcium: 9.4 mg/dL (ref 8.4–10.5)
GFR calc Af Amer: >90 mL/min (ref >90)
GFR calc non Af Amer: 79 mL/min — ABNORMAL LOW (ref >90)
Glucose, Bld: 96 mg/dL (ref 70–99)
Potassium: 3.2 mEq/L — ABNORMAL LOW (ref 3.7–5.3)
Sodium: 133 mEq/L — ABNORMAL LOW (ref 137–147)

## 2014-03-22 LAB — URINALYSIS, ROUTINE W REFLEX MICROSCOPIC
BILIRUBIN URINE: NEGATIVE
Glucose, UA: NEGATIVE mg/dL
Ketones, ur: NEGATIVE mg/dL
NITRITE: POSITIVE — AB
PH: 7 (ref 5.0–8.0)
Protein, ur: NEGATIVE mg/dL
SPECIFIC GRAVITY, URINE: 1.013 (ref 1.005–1.030)
Urobilinogen, UA: 0.2 mg/dL (ref 0.0–1.0)

## 2014-03-22 LAB — URINE MICROSCOPIC-ADD ON

## 2014-03-22 LAB — PRO B NATRIURETIC PEPTIDE
Pro B Natriuretic peptide (BNP): 893.5 pg/mL — ABNORMAL HIGH (ref 0–450)
Pro B Natriuretic peptide (BNP): 901.5 pg/mL — ABNORMAL HIGH (ref 0–450)

## 2014-03-22 MED ORDER — CEPHALEXIN 500 MG PO CAPS: 500.0000 mg | ORAL_CAPSULE | Freq: Four times a day (QID) | ORAL | Status: DC

## 2014-03-22 NOTE — Discharge Instructions (Signed)
Urinary Tract Infection Urinary tract infections (UTIs) can develop anywhere along your urinary tract. Your urinary tract is your body's drainage system for removing wastes and extra water. Your urinary tract includes two kidneys, two ureters, a bladder, and a urethra. Your kidneys are a pair of bean-shaped organs. Each kidney is about the size of your fist. They are located below your ribs, one on each side of your spine. CAUSES Infections are caused by microbes, which are microscopic organisms, including fungi, viruses, and bacteria. These organisms are so small that they can only be seen through a microscope. Bacteria are the microbes that most commonly cause UTIs. SYMPTOMS  Symptoms of UTIs may vary by age and gender of the patient and by the location of the infection. Symptoms in young women typically include a frequent and intense urge to urinate and a painful, burning feeling in the bladder or urethra during urination. Older women and men are more likely to be tired, shaky, and weak and have muscle aches and abdominal pain. A fever may mean the infection is in your kidneys. Other symptoms of a kidney infection include pain in your back or sides below the ribs, nausea, and vomiting. DIAGNOSIS To diagnose a UTI, your caregiver will ask you about your symptoms. Your caregiver also will ask to provide a urine sample. The urine sample will be tested for bacteria and white blood cells. White blood cells are made by your body to help fight infection. TREATMENT  Typically, UTIs can be treated with medication. Because most UTIs are caused by a bacterial infection, they usually can be treated with the use of antibiotics. The choice of antibiotic and length of treatment depend on your symptoms and the type of bacteria causing your infection. HOME CARE INSTRUCTIONS  If you were prescribed antibiotics, take them exactly as your caregiver instructs you. Finish the medication even if you feel better after you  have only taken some of the medication.  Drink enough water and fluids to keep your urine clear or pale yellow.  Avoid caffeine, tea, and carbonated beverages. They tend to irritate your bladder.  Empty your bladder often. Avoid holding urine for long periods of time.  Empty your bladder before and after sexual intercourse.  After a bowel movement, women should cleanse from front to back. Use each tissue only once. SEEK MEDICAL CARE IF:   You have back pain.  You develop a fever.  Your symptoms do not begin to resolve within 3 days. SEEK IMMEDIATE MEDICAL CARE IF:   You have severe back pain or lower abdominal pain.  You develop chills.  You have nausea or vomiting.  You have continued burning or discomfort with urination. MAKE SURE YOU:   Understand these instructions.  Will watch your condition.  Will get help right away if you are not doing well or get worse. Document Released: 04/23/2005 Document Revised: 01/13/2012 Document Reviewed: 08/22/2011 Exit[employer] Patient Information 2015 [employer]. This information is not intended to replace advice given to you by your health care provider. Make sure you discuss any questions you have with your health care provider. Peripheral Edema You have swelling in your legs (peripheral edema). This swelling is due to excess accumulation of salt and water in your body. Edema may be a sign of heart, kidney or liver disease, or a side effect of a medication. It may also be due to problems in the leg veins. Elevating your legs and using special support stockings may be very helpful, if the   cause of the swelling is due to poor venous circulation. Avoid long periods of standing, whatever the cause. Treatment of edema depends on identifying the cause. Chips, pretzels, pickles and other salty foods should be avoided. Restricting salt in your diet is almost always needed. Water pills (diuretics) are often used to remove the excess salt and water  from your body via urine. These medicines prevent the kidney from reabsorbing sodium. This increases urine flow. Diuretic treatment may also result in lowering of potassium levels in your body. Potassium supplements may be needed if you have to use diuretics daily. Daily weights can help you keep track of your progress in clearing your edema. You should call your caregiver for follow up care as recommended. SEEK IMMEDIATE MEDICAL CARE IF:   You have increased swelling, pain, redness, or heat in your legs.  You develop shortness of breath, especially when lying down.  You develop chest or abdominal pain, weakness, or fainting.  You have a fever. Document Released: 08/21/2004 Document Revised: 10/06/2011 Document Reviewed: 08/01/2009 Exit[employer] Patient Information 2015 [employer]. This information is not intended to replace advice given to you by your health care provider. Make sure you discuss any questions you have with your health care provider.  

## 2014-03-22 NOTE — ED Provider Notes (Signed)
CSN: [id number]     Arrival date & time 03/22/14  1412 History   First MD Initiated Contact with Patient 03/22/14 1501     Chief Complaint  Patient presents with  . Leg Swelling    bilateral leg edema     (Consider location/radiation/quality/duration/timing/severity/associated sxs/prior Treatment) HPI [name] is an 80 year old female with past medical history of diabetes, hypertension, diverticulitis, fibromyalgia who presents to the ER today with complaint of bilateral leg swelling. Patient states she has chronic dependent edema bilaterally, and states that her swelling became acutely worse today. She states her edema is typically "better" in the morning and she noted that today her edema did not go down when she woke up. She denies pain to the area, redness. She states she is followed by the wound clinic for a persistent wound on her left lower leg. She denies chest pain, palpitations, dizziness, shortness of breath, fever, abdominal pain, nausea, vomiting, dysuria Past Medical History  Diagnosis Date  . Diverticulitis   . Arthritis   . Fibromyalgia   . Diverticulosis   . Hypothyroidism   . Osteoarthritis   . DJD (degenerative joint disease)    Past Surgical History  Procedure Laterality Date  . Ectopic pregnancy surgery    . Abdominal hysterectomy    . Abdominal adhesion surgery    . Knee arthroplasty     Family History  Problem Relation Age of Onset  . Prostate cancer     History  Substance Use Topics  . Smoking status: Former Smoker    Types: Cigarettes    Quit date: 06/30/1984  . Smokeless tobacco: Never Used  . Alcohol Use: Yes   OB History   Grav Para Term Preterm Abortions TAB SAB Ect Mult Living                 Review of Systems  Constitutional: Negative for fever, chills and unexpected weight change.  HENT: Negative for trouble swallowing.   Eyes: Negative for visual disturbance.  Respiratory: Negative for shortness of breath.   Cardiovascular:  Positive for leg swelling. Negative for chest pain and palpitations.  Gastrointestinal: Negative for nausea, vomiting and abdominal pain.  Endocrine: Positive for polyuria.  Genitourinary: Negative for dysuria.  Musculoskeletal: Negative for neck pain.  Skin: Negative for rash.  Neurological: Negative for dizziness, weakness and numbness.  Psychiatric/Behavioral: Negative.       Allergies  Antihistamines, chlorpheniramine-type and Codeine  Home Medications   Prior to Admission medications   Medication Sig Start Date End Date Taking? Authorizing Provider  levothyroxine (SYNTHROID, LEVOTHROID) 100 MCG tablet Take 100 mcg by mouth. Take 100 mcg on Monday and Friday.  Take 112 mcg all other days.   Yes Historical Provider, MD  levothyroxine (SYNTHROID, LEVOTHROID) 112 MCG tablet Take 112 mcg by mouth. Take 112 mcg on Tuesday, Wednesday, Thursday, Saturday and Sunday.Take 100 mcg on other days.   Yes Historical Provider, MD  loperamide (IMODIUM A-D) 2 MG tablet Take 2 mg by mouth 4 (four) times daily as needed for diarrhea or loose stools.   Yes Historical Provider, MD  nabumetone (RELAFEN) 750 MG tablet Take 750 mg by mouth as needed for mild pain (as directed.).    Yes Historical Provider, MD  polyethylene glycol (MIRALAX / GLYCOLAX) packet Take 17 g by mouth daily as needed for mild constipation.    Yes Historical Provider, MD  traMADol (ULTRAM) 50 MG tablet Take 50-100 mg by mouth. Maximum dose= 8 tablets per day  Yes Historical Provider, MD  cephALEXin (KEFLEX) 500 MG capsule Take 1 capsule (500 mg total) by mouth 4 (four) times daily. For 7 (seven) days. 03/22/14   Monte Fantasia, PA-C   BP 120/62  Pulse 70  Temp(Src) 97.8 F (36.6 C) (Oral)  Resp 20  SpO2 98% Physical Exam  Constitutional: She is oriented to person, place, and time. She appears well-developed and well-nourished. No distress.  HENT:  Head: Normocephalic and atraumatic.  Mouth/Throat: Oropharynx is clear and  moist. No oropharyngeal exudate.  Eyes: Right eye exhibits no discharge. Left eye exhibits no discharge. No scleral icterus.  Neck: Normal range of motion.  Cardiovascular: Normal rate, regular rhythm and S2 normal.   Murmur heard.  Systolic murmur is present with a grade of 2/6  Pulmonary/Chest: Effort normal. No accessory muscle usage. Not tachypneic. No respiratory distress. She has rales in the right middle field, the right lower field, the left middle field and the left lower field.  Abdominal: Normal appearance and bowel sounds are normal. There is no tenderness.  Musculoskeletal: Normal range of motion. She exhibits no edema and no tenderness.  Lymphadenopathy:  Bilateral +3 to +4 pitting pedal edema extending to patient's knees bilaterally. DP pulse 2+. Distal sensation intact. Nonhealing, erythematous wound noted on patient's left anterior lower extremity. Homan sign negative. No tenderness to palpation noted to  Neurological: She is alert and oriented to person, place, and time. No cranial nerve deficit. Coordination normal.  Skin: Skin is warm and dry. No rash noted. She is not diaphoretic.  Psychiatric: She has a normal mood and affect.    ED Course  Procedures (including critical care time) Labs Review Labs Reviewed  CBC WITH DIFFERENTIAL - Abnormal; Notable for the following:    Platelets 411 (*)    All other components within normal limits  BASIC METABOLIC PANEL - Abnormal; Notable for the following:    Sodium 133 (*)    Potassium 3.2 (*)    Chloride 91 (*)    GFR calc non Af Amer 79 (*)    All other components within normal limits  URINALYSIS, ROUTINE W REFLEX MICROSCOPIC - Abnormal; Notable for the following:    APPearance CLOUDY (*)    Hgb urine dipstick TRACE (*)    Nitrite POSITIVE (*)    Leukocytes, UA MODERATE (*)    All other components within normal limits  PRO B NATRIURETIC PEPTIDE - Abnormal; Notable for the following:    Pro B Natriuretic peptide (BNP)  893.5 (*)    All other components within normal limits  URINE MICROSCOPIC-ADD ON - Abnormal; Notable for the following:    Bacteria, UA MANY (*)    All other components within normal limits  PRO B NATRIURETIC PEPTIDE - Abnormal; Notable for the following:    Pro B Natriuretic peptide (BNP) 901.5 (*)    All other components within normal limits    Imaging Review Dg Chest 2 View  03/22/2014   CLINICAL DATA:  Bilateral lower leg swelling and weakness. Shortness of breath.  EXAM: CHEST  2 VIEW  COMPARISON:  09/23/2012.  FINDINGS: Hyperexpansion suggests emphysema. No edema or focal airspace consolidation. No pleural effusion. The cardio pericardial silhouette is enlarged. Hiatal hernia again noted. Bones are diffusely demineralized.  IMPRESSION: Emphysema without acute cardiopulmonary findings.  Hiatal hernia.   Electronically Signed   By: Kennith Center M.D.   On: 03/22/2014 16:34     EKG Interpretation   Date/Time:  Wednesday March 22 2014  17:22:44 EDT Ventricular Rate:  74 PR Interval:  170 QRS Duration: 105 QT Interval:  510 QTC Calculation: 566 R Axis:   -20 Text Interpretation:  Sinus rhythm Borderline left axis deviation  Borderline T abnormalities, anterior leads Prolonged QT interval Baseline  wander in lead(s) V3 No other significant changes noted Confirmed by  HARRISON  MD, FORREST ([id number]) on 03/22/2014 5:29:20 PM      MDM   Final diagnoses:  Urinary tract infection without hematuria, site unspecified  Pedal edema    80 year old female with past medical history of diabetes, dependent bilateral edema present today with one day of bilateral pedal edema acutely worse from baseline as reported by the patient. Denies history of CHF. Her last echo 08/2012 showedLV EF: 50% - 55%. Denies chest pain, shortness of breath, nausea, vomiting, abdominal pain. Bilateral +4 pitting edema noted bilaterally below the knees. Mild rales noted diffusely patient's lungs. No tachypnea no dyspnea  noted on exam. Workup to include CBC, BMP, UA, chest x-ray, BNP.    5:45 PM: Patient's electrolytes mildly lowered. Patient does have UA concerning for a UTI. We will treat with Keflex. EKG unremarkable. Other labs unremarkable. We will discharge patient with treatment for UTI and have her followup with her PCP. We gave patient information regarding pedal edema and educated on use of compression stockings and elevation of her feet to help her dependent edema. Patient is agreeable to this plan. We encouraged patient to followup with her PCP and to call or return to the ER should her symptoms change, worsen or should she have any questions or concerns.    Filed Vitals:   03/22/14 1729  BP: 120/62  Pulse: 70  Temp: 97.8 F (36.6 C)  Resp: 20     Signed,  Ladona Mow, PA-C 1:37 AM   This patient seen and discussed with Dr. Purvis Sheffield, MD  Monte Fantasia, PA-C 03/23/14 [phone]

## 2014-03-22 NOTE — ED Notes (Signed)
Bed: WA18 Expected date:  Expected time:  Means of arrival:  Comments: EMS 

## 2014-03-22 NOTE — ED Notes (Signed)
Pt from home. Pt co bilateral edema for two weeks. Pt has hx of same issue but it worse today. 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 Pt alert and oriented x4, in no apparent distress. Pt denies pain or SOB. Pt is usually ambulatory using walker.

## 2014-03-24 NOTE — ED Provider Notes (Signed)
Medical screening examination/treatment/procedure(s) were conducted as a shared visit with non-physician practitioner(s) and myself.  I personally evaluated the patient during the encounter.   EKG Interpretation   Date/Time:  Wednesday March 22 2014 17:22:44 EDT Ventricular Rate:  74 PR Interval:  170 QRS Duration: 105 QT Interval:  510 QTC Calculation: 566 R Axis:   -20 Text Interpretation:  Sinus rhythm Borderline left axis deviation  Borderline T abnormalities, anterior leads Prolonged QT interval Baseline  wander in lead(s) V3 No other significant changes noted Confirmed by  Khalee Mazo  MD, Cosandra Plouffe ([id number]) on 03/22/2014 5:29:20 PM       I interviewed and examined the patient. Lungs are CTAB. Cardiac exam wnl, murmur heard. Abdomen soft.  Mod pitting edema extending to knees bilaterally. I interpreted/reviewed the labs and/or imaging which were non-contributory. Pt is not hypoxic, no inc wob, appears comfortable. Will tx for UTI and rec close f/u w/ pcp.   Purvis Sheffield, MD 03/24/14 [phone]

## 2014-04-04 ENCOUNTER — Encounter (HOSPITAL_COMMUNITY): Payer: Self-pay | Admitting: Emergency Medicine

## 2014-04-04 ENCOUNTER — Emergency Department (HOSPITAL_COMMUNITY): Payer: Medicare Other

## 2014-04-04 ENCOUNTER — Emergency Department (HOSPITAL_COMMUNITY)
Admission: EM | Admit: 2014-04-04 | Discharge: 2014-04-04 | Discharge status: Against Medical Advice | Payer: Medicare Other | Attending: Emergency Medicine | Admitting: Emergency Medicine

## 2014-04-04 DIAGNOSIS — R143 Flatulence: Secondary | ICD-10-CM | POA: Diagnosis not present

## 2014-04-04 DIAGNOSIS — M1990 Unspecified osteoarthritis, unspecified site: Secondary | ICD-10-CM | POA: Diagnosis not present

## 2014-04-04 DIAGNOSIS — Z79899 Other long term (current) drug therapy: Secondary | ICD-10-CM | POA: Insufficient documentation

## 2014-04-04 DIAGNOSIS — L539 Erythematous condition, unspecified: Secondary | ICD-10-CM | POA: Insufficient documentation

## 2014-04-04 DIAGNOSIS — Z87440 Personal history of urinary (tract) infections: Secondary | ICD-10-CM | POA: Insufficient documentation

## 2014-04-04 DIAGNOSIS — R609 Edema, unspecified: Secondary | ICD-10-CM | POA: Insufficient documentation

## 2014-04-04 DIAGNOSIS — E039 Hypothyroidism, unspecified: Secondary | ICD-10-CM | POA: Diagnosis not present

## 2014-04-04 DIAGNOSIS — Z87891 Personal history of nicotine dependence: Secondary | ICD-10-CM | POA: Insufficient documentation

## 2014-04-04 DIAGNOSIS — R197 Diarrhea, unspecified: Secondary | ICD-10-CM | POA: Diagnosis not present

## 2014-04-04 DIAGNOSIS — Z8719 Personal history of other diseases of the digestive system: Secondary | ICD-10-CM | POA: Insufficient documentation

## 2014-04-04 DIAGNOSIS — K2990 Gastroduodenitis, unspecified, without bleeding: Secondary | ICD-10-CM | POA: Diagnosis not present

## 2014-04-04 DIAGNOSIS — R141 Gas pain: Secondary | ICD-10-CM | POA: Diagnosis not present

## 2014-04-04 DIAGNOSIS — R600 Localized edema: Secondary | ICD-10-CM

## 2014-04-04 DIAGNOSIS — K2970 Gastritis, unspecified, without bleeding: Secondary | ICD-10-CM | POA: Diagnosis not present

## 2014-04-04 NOTE — ED Notes (Signed)
Per EMS, pt from home, reports chronic diarrhea, has hx of diverticulitis.

## 2014-04-04 NOTE — ED Notes (Signed)
Pt could not provide urine sample.

## 2014-04-04 NOTE — ED Notes (Addendum)
Pt states she wants to go home and does not want to be here. MD mad aware, pt attempting to find ride home.

## 2014-04-04 NOTE — ED Notes (Signed)
Pt states her ride will be here after while.

## 2014-04-04 NOTE — ED Provider Notes (Signed)
CSN: [id number]     Arrival date & time 04/04/14  1604 History   First MD Initiated Contact with Patient 04/04/14 1648     Chief Complaint  Patient presents with  . Diarrhea     (Consider location/radiation/quality/duration/timing/severity/associated sxs/prior Treatment) HPI Comments: Patient presents to the ER from home. She comes to the ER via ambulance. Patient is complaining of bilateral lower extremity swelling. Patient reports that she has problems with swelling in her legs frequently. She says she has been using cold compresses on them. Often the swelling goes down, but today it has not gone down. She is not taking any pain. She denies any injury to the legs. She did, however, her White man has a nonhealing wound on the left shin, unchanged.  Patient also complains of diarrhea. She reports that besides her abdomen are "rockhard". She does not have any nausea or vomiting. She denies abdominal pain. He has not had any fever. She does report intermittent diarrhea, this has been present previously for her, is not unusual. She has not had any rectal bleeding.   Past Medical History  Diagnosis Date  . Diverticulitis   . Arthritis   . Fibromyalgia   . Diverticulosis   . Hypothyroidism   . Osteoarthritis   . DJD (degenerative joint disease)    Past Surgical History  Procedure Laterality Date  . Ectopic pregnancy surgery    . Abdominal hysterectomy    . Abdominal adhesion surgery    . Knee arthroplasty     Family History  Problem Relation Age of Onset  . Prostate cancer     History  Substance Use Topics  . Smoking status: Former Smoker    Types: Cigarettes    Quit date: 06/30/1984  . Smokeless tobacco: Never Used  . Alcohol Use: Yes   OB History   Grav Para Term Preterm Abortions TAB SAB Ect Mult Living                 Review of Systems  Cardiovascular: Positive for leg swelling.  Gastrointestinal: Positive for diarrhea.  Skin: Positive for wound.  All other systems  reviewed and are negative.     Allergies  Antihistamines, chlorpheniramine-type and Codeine  Home Medications   Prior to Admission medications   Medication Sig Start Date End Date Taking? Authorizing Oz Gammel  levothyroxine (SYNTHROID, LEVOTHROID) 100 MCG tablet Take 100 mcg by mouth. Take 100 mcg on Monday and Friday.  Take 112 mcg all other days.   Yes Historical Catharine Kettlewell, MD  levothyroxine (SYNTHROID, LEVOTHROID) 112 MCG tablet Take 112 mcg by mouth. Take 112 mcg on Tuesday, Wednesday, Thursday, Saturday and Sunday.Take 100 mcg on other days.   Yes Historical Vasilisa Vore, MD  loperamide (IMODIUM A-D) 2 MG tablet Take 2 mg by mouth 4 (four) times daily as needed for diarrhea or loose stools.   Yes Historical Jesten Cappuccio, MD  nabumetone (RELAFEN) 750 MG tablet Take 750 mg by mouth as needed for mild pain (as directed.).    Yes Historical Javion Holmer, MD  polyethylene glycol (MIRALAX / GLYCOLAX) packet Take 17 g by mouth daily as needed for mild constipation.    Yes Historical Anastasija Anfinson, MD  traMADol (ULTRAM) 50 MG tablet Take 50-100 mg by mouth. Maximum dose= 8 tablets per day   Yes Historical Angelito Hopping, MD  cephALEXin (KEFLEX) 500 MG capsule Take 1 capsule (500 mg total) by mouth 4 (four) times daily. For 7 (seven) days. 03/22/14   Monte Fantasia, PA-C   BP 133/77  Pulse 82  Temp(Src) 98.1 F (36.7 C) (Oral)  Resp 14  SpO2 96% Physical Exam  Constitutional: She is oriented to person, place, and time. She appears well-developed and well-nourished. No distress.  HENT:  Head: Normocephalic and atraumatic.  Right Ear: Hearing normal.  Left Ear: Hearing normal.  Nose: Nose normal.  Mouth/Throat: Oropharynx is clear and moist and mucous membranes are normal.  Eyes: Conjunctivae and EOM are normal. Pupils are equal, round, and reactive to light.  Neck: Normal range of motion. Neck supple.  Cardiovascular: Regular rhythm, S1 normal and S2 normal.  Exam reveals no gallop and no friction rub.   No  murmur heard. Pulmonary/Chest: Effort normal and breath sounds normal. No respiratory distress. She exhibits no tenderness.  Abdominal: Soft. Normal appearance and bowel sounds are normal. There is no hepatosplenomegaly. There is no tenderness. There is no rebound, no guarding, no tenderness at McBurney's point and negative Murphy's sign. No hernia.  Musculoskeletal: Normal range of motion. She exhibits edema.  Neurological: She is alert and oriented to person, place, and time. She has normal strength. No cranial nerve deficit or sensory deficit. Coordination normal. GCS eye subscore is 4. GCS verbal subscore is 5. GCS motor subscore is 6.  Skin: Skin is warm, dry and intact. No rash noted. No cyanosis.     Psychiatric: She has a normal mood and affect. Her speech is normal and behavior is normal. Thought content normal.    ED Course  Procedures (including critical care time) Labs Review Labs Reviewed  CLOSTRIDIUM DIFFICILE BY PCR  CBC WITH DIFFERENTIAL  COMPREHENSIVE METABOLIC PANEL  URINALYSIS, ROUTINE W REFLEX MICROSCOPIC  TROPONIN I  PRO B NATRIURETIC PEPTIDE    Imaging Review No results found.   EKG Interpretation None      MDM   Final diagnoses:  None   lower extremity edema, unclear etiology  Diarrhea  Patient presented to the ER with multiple complaints. Patient is reporting increased swelling of both her legs. Reviewing her records reveals that she was recently seen for this. Her workup was essentially negative at that time. Do not see any significant change compared to the description of her legs previously. I did want to perform workup to evaluate for possible congestive heart failure. Patient also complaining of diarrhea. She has a history of diverticulitis. She is not expressing abdominal pain currently, however. Also, patient has recently been on antibiotics for a UTI. It was felt important to recheck the urine as well as check for C. difficile with the diarrhea.  After discussing all of the planned workup with the patient, however, she decided that she did not want to be evaluated. She did not want x-rays or blood work performed. Patient opted to leave the ER after my evaluation AGAINST MEDICAL ADVICE.    Gilda Crease, MD 04/04/14 [phone]

## 2014-05-08 ENCOUNTER — Emergency Department (HOSPITAL_COMMUNITY): Payer: Medicare Other

## 2014-05-08 ENCOUNTER — Inpatient Hospital Stay (HOSPITAL_COMMUNITY)
Admission: EM | Admit: 2014-05-08 | Discharge: 2014-05-19 | DRG: 329 | Disposition: A | Discharge status: Skilled Nursing Facility | Payer: Medicare Other | Attending: Internal Medicine | Admitting: Internal Medicine

## 2014-05-08 ENCOUNTER — Encounter (HOSPITAL_COMMUNITY): Payer: Self-pay | Admitting: Emergency Medicine

## 2014-05-08 DIAGNOSIS — D649 Anemia, unspecified: Secondary | ICD-10-CM | POA: Diagnosis present

## 2014-05-08 DIAGNOSIS — H9190 Unspecified hearing loss, unspecified ear: Secondary | ICD-10-CM | POA: Diagnosis present

## 2014-05-08 DIAGNOSIS — J9811 Atelectasis: Secondary | ICD-10-CM | POA: Diagnosis not present

## 2014-05-08 DIAGNOSIS — Z8719 Personal history of other diseases of the digestive system: Secondary | ICD-10-CM

## 2014-05-08 DIAGNOSIS — I10 Essential (primary) hypertension: Secondary | ICD-10-CM | POA: Diagnosis present

## 2014-05-08 DIAGNOSIS — M1990 Unspecified osteoarthritis, unspecified site: Secondary | ICD-10-CM | POA: Diagnosis present

## 2014-05-08 DIAGNOSIS — B9689 Other specified bacterial agents as the cause of diseases classified elsewhere: Secondary | ICD-10-CM

## 2014-05-08 DIAGNOSIS — K439 Ventral hernia without obstruction or gangrene: Secondary | ICD-10-CM | POA: Diagnosis present

## 2014-05-08 DIAGNOSIS — R6889 Other general symptoms and signs: Secondary | ICD-10-CM | POA: Diagnosis not present

## 2014-05-08 DIAGNOSIS — S81801S Unspecified open wound, right lower leg, sequela: Secondary | ICD-10-CM | POA: Diagnosis not present

## 2014-05-08 DIAGNOSIS — I480 Paroxysmal atrial fibrillation: Secondary | ICD-10-CM | POA: Diagnosis not present

## 2014-05-08 DIAGNOSIS — N8110 Cystocele, unspecified: Secondary | ICD-10-CM | POA: Diagnosis not present

## 2014-05-08 DIAGNOSIS — I872 Venous insufficiency (chronic) (peripheral): Secondary | ICD-10-CM | POA: Diagnosis present

## 2014-05-08 DIAGNOSIS — E039 Hypothyroidism, unspecified: Secondary | ICD-10-CM | POA: Diagnosis present

## 2014-05-08 DIAGNOSIS — R34 Anuria and oliguria: Secondary | ICD-10-CM | POA: Diagnosis not present

## 2014-05-08 DIAGNOSIS — E871 Hypo-osmolality and hyponatremia: Secondary | ICD-10-CM | POA: Diagnosis present

## 2014-05-08 DIAGNOSIS — B964 Proteus (mirabilis) (morganii) as the cause of diseases classified elsewhere: Secondary | ICD-10-CM | POA: Diagnosis present

## 2014-05-08 DIAGNOSIS — K56699 Other intestinal obstruction unspecified as to partial versus complete obstruction: Secondary | ICD-10-CM | POA: Diagnosis present

## 2014-05-08 DIAGNOSIS — K868 Other specified diseases of pancreas: Secondary | ICD-10-CM | POA: Diagnosis not present

## 2014-05-08 DIAGNOSIS — M797 Fibromyalgia: Secondary | ICD-10-CM | POA: Diagnosis present

## 2014-05-08 DIAGNOSIS — R579 Shock, unspecified: Secondary | ICD-10-CM | POA: Diagnosis not present

## 2014-05-08 DIAGNOSIS — R0602 Shortness of breath: Secondary | ICD-10-CM

## 2014-05-08 DIAGNOSIS — Z6827 Body mass index (BMI) 27.0-27.9, adult: Secondary | ICD-10-CM

## 2014-05-08 DIAGNOSIS — J9690 Respiratory failure, unspecified, unspecified whether with hypoxia or hypercapnia: Secondary | ICD-10-CM

## 2014-05-08 DIAGNOSIS — I4891 Unspecified atrial fibrillation: Secondary | ICD-10-CM | POA: Diagnosis not present

## 2014-05-08 DIAGNOSIS — J90 Pleural effusion, not elsewhere classified: Secondary | ICD-10-CM | POA: Diagnosis not present

## 2014-05-08 DIAGNOSIS — Z66 Do not resuscitate: Secondary | ICD-10-CM | POA: Diagnosis present

## 2014-05-08 DIAGNOSIS — K5900 Constipation, unspecified: Secondary | ICD-10-CM

## 2014-05-08 DIAGNOSIS — B961 Klebsiella pneumoniae [K. pneumoniae] as the cause of diseases classified elsewhere: Secondary | ICD-10-CM

## 2014-05-08 DIAGNOSIS — J189 Pneumonia, unspecified organism: Secondary | ICD-10-CM | POA: Diagnosis not present

## 2014-05-08 DIAGNOSIS — M7989 Other specified soft tissue disorders: Secondary | ICD-10-CM | POA: Diagnosis present

## 2014-05-08 DIAGNOSIS — N3090 Cystitis, unspecified without hematuria: Secondary | ICD-10-CM | POA: Diagnosis not present

## 2014-05-08 DIAGNOSIS — Z79899 Other long term (current) drug therapy: Secondary | ICD-10-CM | POA: Diagnosis not present

## 2014-05-08 DIAGNOSIS — I517 Cardiomegaly: Secondary | ICD-10-CM | POA: Diagnosis not present

## 2014-05-08 DIAGNOSIS — K5669 Other intestinal obstruction: Secondary | ICD-10-CM | POA: Diagnosis present

## 2014-05-08 DIAGNOSIS — Z515 Encounter for palliative care: Secondary | ICD-10-CM | POA: Diagnosis not present

## 2014-05-08 DIAGNOSIS — Z87891 Personal history of nicotine dependence: Secondary | ICD-10-CM

## 2014-05-08 DIAGNOSIS — R600 Localized edema: Secondary | ICD-10-CM

## 2014-05-08 DIAGNOSIS — K567 Ileus, unspecified: Secondary | ICD-10-CM

## 2014-05-08 DIAGNOSIS — E440 Moderate protein-calorie malnutrition: Secondary | ICD-10-CM | POA: Diagnosis present

## 2014-05-08 DIAGNOSIS — J9600 Acute respiratory failure, unspecified whether with hypoxia or hypercapnia: Secondary | ICD-10-CM

## 2014-05-08 DIAGNOSIS — K56609 Unspecified intestinal obstruction, unspecified as to partial versus complete obstruction: Secondary | ICD-10-CM

## 2014-05-08 DIAGNOSIS — F09 Unspecified mental disorder due to known physiological condition: Secondary | ICD-10-CM | POA: Diagnosis present

## 2014-05-08 DIAGNOSIS — N390 Urinary tract infection, site not specified: Secondary | ICD-10-CM | POA: Diagnosis present

## 2014-05-08 DIAGNOSIS — R578 Other shock: Secondary | ICD-10-CM | POA: Diagnosis not present

## 2014-05-08 DIAGNOSIS — F329 Major depressive disorder, single episode, unspecified: Secondary | ICD-10-CM | POA: Diagnosis present

## 2014-05-08 DIAGNOSIS — F0390 Unspecified dementia without behavioral disturbance: Secondary | ICD-10-CM | POA: Diagnosis present

## 2014-05-08 DIAGNOSIS — E031 Congenital hypothyroidism without goiter: Secondary | ICD-10-CM | POA: Diagnosis not present

## 2014-05-08 DIAGNOSIS — K5710 Diverticulosis of small intestine without perforation or abscess without bleeding: Secondary | ICD-10-CM | POA: Diagnosis present

## 2014-05-08 DIAGNOSIS — I959 Hypotension, unspecified: Secondary | ICD-10-CM | POA: Diagnosis present

## 2014-05-08 DIAGNOSIS — K5730 Diverticulosis of large intestine without perforation or abscess without bleeding: Secondary | ICD-10-CM | POA: Diagnosis present

## 2014-05-08 DIAGNOSIS — J9601 Acute respiratory failure with hypoxia: Secondary | ICD-10-CM | POA: Diagnosis not present

## 2014-05-08 DIAGNOSIS — R609 Edema, unspecified: Secondary | ICD-10-CM | POA: Diagnosis not present

## 2014-05-08 DIAGNOSIS — J9620 Acute and chronic respiratory failure, unspecified whether with hypoxia or hypercapnia: Secondary | ICD-10-CM | POA: Diagnosis not present

## 2014-05-08 DIAGNOSIS — K5732 Diverticulitis of large intestine without perforation or abscess without bleeding: Secondary | ICD-10-CM | POA: Diagnosis present

## 2014-05-08 DIAGNOSIS — F0391 Unspecified dementia with behavioral disturbance: Secondary | ICD-10-CM | POA: Diagnosis not present

## 2014-05-08 DIAGNOSIS — E876 Hypokalemia: Secondary | ICD-10-CM

## 2014-05-08 DIAGNOSIS — K449 Diaphragmatic hernia without obstruction or gangrene: Secondary | ICD-10-CM | POA: Diagnosis not present

## 2014-05-08 DIAGNOSIS — I482 Chronic atrial fibrillation: Secondary | ICD-10-CM | POA: Diagnosis not present

## 2014-05-08 DIAGNOSIS — K5660 Unspecified intestinal obstruction: Secondary | ICD-10-CM | POA: Diagnosis not present

## 2014-05-08 DIAGNOSIS — J811 Chronic pulmonary edema: Secondary | ICD-10-CM | POA: Diagnosis not present

## 2014-05-08 DIAGNOSIS — R0902 Hypoxemia: Secondary | ICD-10-CM | POA: Diagnosis not present

## 2014-05-08 HISTORY — DX: Diverticulosis of large intestine without perforation or abscess without bleeding: K57.30

## 2014-05-08 LAB — COMPREHENSIVE METABOLIC PANEL
ALT: 18 U/L (ref 0–35)
AST: 28 U/L (ref 0–37)
Albumin: 3.5 g/dL (ref 3.5–5.2)
Alkaline Phosphatase: 60 U/L (ref 39–117)
Anion gap: 18 — ABNORMAL HIGH (ref 5–15)
BUN: 28 mg/dL — ABNORMAL HIGH (ref 6–23)
CALCIUM: 9.4 mg/dL (ref 8.4–10.5)
CO2: 24 mEq/L (ref 19–32)
CREATININE: 0.79 mg/dL (ref 0.50–1.10)
Chloride: 91 mEq/L — ABNORMAL LOW (ref 96–112)
GFR, EST AFRICAN AMERICAN: 85 mL/min — AB (ref >90)
GFR, EST NON AFRICAN AMERICAN: 73 mL/min — AB (ref >90)
GLUCOSE: 97 mg/dL (ref 70–99)
Potassium: 3.9 mEq/L (ref 3.7–5.3)
Sodium: 133 mEq/L — ABNORMAL LOW (ref 137–147)
TOTAL PROTEIN: 7 g/dL (ref 6.0–8.3)
Total Bilirubin: 0.7 mg/dL (ref 0.3–1.2)

## 2014-05-08 LAB — CBC WITH DIFFERENTIAL/PLATELET
BASOS ABS: 0 10*3/uL (ref 0.0–0.1)
Basophils Relative: 0 % (ref 0–1)
EOS PCT: 0 % (ref 0–5)
Eosinophils Absolute: 0 10*3/uL (ref 0.0–0.7)
HCT: 42.9 % (ref 36.0–46.0)
Hemoglobin: 14.8 g/dL (ref 12.0–15.0)
LYMPHS ABS: 0.8 10*3/uL (ref 0.7–4.0)
Lymphocytes Relative: 17 % (ref 12–46)
MCH: 29.8 pg (ref 26.0–34.0)
MCHC: 34.5 g/dL (ref 30.0–36.0)
MCV: 86.5 fL (ref 78.0–100.0)
MONO ABS: 0.6 10*3/uL (ref 0.1–1.0)
Monocytes Relative: 14 % — ABNORMAL HIGH (ref 3–12)
Neutro Abs: 3.3 10*3/uL (ref 1.7–7.7)
Neutrophils Relative %: 69 % (ref 43–77)
PLATELETS: 410 10*3/uL — AB (ref 150–400)
RBC: 4.96 MIL/uL (ref 3.87–5.11)
RDW: 14.6 % (ref 11.5–15.5)
WBC: 4.8 10*3/uL (ref 4.0–10.5)

## 2014-05-08 LAB — TROPONIN I

## 2014-05-08 LAB — PROTIME-INR
INR: 0.99 (ref 0.00–1.49)
PROTHROMBIN TIME: 13.2 s (ref 11.6–15.2)

## 2014-05-08 LAB — LIPASE, BLOOD: Lipase: 60 U/L — ABNORMAL HIGH (ref 11–59)

## 2014-05-08 LAB — PRO B NATRIURETIC PEPTIDE: PRO B NATRI PEPTIDE: 1099 pg/mL — AB (ref 0–450)

## 2014-05-08 MED ORDER — ONDANSETRON HCL 4 MG/2ML IJ SOLN: 4.0000 mg | Freq: Four times a day (QID) | INTRAMUSCULAR | Status: DC | PRN

## 2014-05-08 MED ORDER — FUROSEMIDE 10 MG/ML IJ SOLN
20.0000 mg | Freq: Once | INTRAMUSCULAR | Status: AC
  Administered 2014-05-09: 20 mg via INTRAVENOUS
  Filled 2014-05-08: qty 2

## 2014-05-08 MED ORDER — ACETAMINOPHEN 650 MG RE SUPP
650.0000 mg | Freq: Four times a day (QID) | RECTAL | Status: DC | PRN
  Administered 2014-05-14: 650 mg via RECTAL
  Filled 2014-05-08: qty 1

## 2014-05-08 MED ORDER — POLYETHYLENE GLYCOL 3350 17 G PO PACK
17.0000 g | PACK | Freq: Every day | ORAL | Status: DC | PRN
  Filled 2014-05-08: qty 1

## 2014-05-08 MED ORDER — ACETAMINOPHEN 325 MG PO TABS: 650.0000 mg | ORAL_TABLET | Freq: Four times a day (QID) | ORAL | Status: DC | PRN

## 2014-05-08 MED ORDER — ONDANSETRON HCL 4 MG PO TABS: 4.0000 mg | ORAL_TABLET | Freq: Four times a day (QID) | ORAL | Status: DC | PRN

## 2014-05-08 MED ORDER — LORAZEPAM 2 MG/ML IJ SOLN
0.2500 mg | Freq: Once | INTRAMUSCULAR | Status: AC
  Administered 2014-05-08: 0.25 mg via INTRAVENOUS
  Filled 2014-05-08: qty 1

## 2014-05-08 MED ORDER — SODIUM CHLORIDE 0.9 % IV SOLN
INTRAVENOUS | Status: DC
  Administered 2014-05-09 – 2014-05-10 (×2): via INTRAVENOUS
  Administered 2014-05-11 (×2): 75 mL via INTRAVENOUS

## 2014-05-08 MED ORDER — IOHEXOL 300 MG/ML  SOLN
100.0000 mL | Freq: Once | INTRAMUSCULAR | Status: AC | PRN
  Administered 2014-05-08: 80 mL via INTRAVENOUS

## 2014-05-08 MED ORDER — LEVOTHYROXINE SODIUM 50 MCG PO TABS
100.0000 ug | ORAL_TABLET | ORAL | Status: DC
  Administered 2014-05-12: 100 ug via ORAL
  Filled 2014-05-08: qty 1

## 2014-05-08 MED ORDER — HEPARIN SODIUM (PORCINE) 5000 UNIT/ML IJ SOLN
5000.0000 [IU] | Freq: Three times a day (TID) | INTRAMUSCULAR | Status: DC
  Administered 2014-05-09 – 2014-05-19 (×31): 5000 [IU] via SUBCUTANEOUS
  Filled 2014-05-08 (×36): qty 1

## 2014-05-08 MED ORDER — LORAZEPAM 0.5 MG PO TABS: 0.2500 mg | ORAL_TABLET | Freq: Once | ORAL | Status: DC | PRN

## 2014-05-08 MED ORDER — IOHEXOL 300 MG/ML  SOLN
50.0000 mL | Freq: Once | INTRAMUSCULAR | Status: AC | PRN
  Administered 2014-05-08: 50 mL via ORAL

## 2014-05-08 MED ORDER — TRAMADOL HCL 50 MG PO TABS: 50.0000 mg | ORAL_TABLET | Freq: Four times a day (QID) | ORAL | Status: DC | PRN

## 2014-05-08 MED ORDER — TRAZODONE HCL 50 MG PO TABS
50.0000 mg | ORAL_TABLET | Freq: Every day | ORAL | Status: DC
  Administered 2014-05-10 – 2014-05-11 (×2): 50 mg via ORAL
  Filled 2014-05-08 (×4): qty 1

## 2014-05-08 MED ORDER — LEVOTHYROXINE SODIUM 112 MCG PO TABS
112.0000 ug | ORAL_TABLET | ORAL | Status: DC
  Filled 2014-05-08 (×4): qty 1

## 2014-05-08 NOTE — ED Provider Notes (Signed)
CSN: [id number]     Arrival date & time 05/08/14  1519 History   First MD Initiated Contact with Patient 05/08/14 1528     Chief Complaint  Patient presents with  . Leg Swelling  . Bloated  . Shortness of Breath     (Consider location/radiation/quality/duration/timing/severity/associated sxs/prior Treatment) The history is provided by the patient. No language interpreter was used.  Joslynne GRAYCE BUDDEN is an 80 y/o F with PMHx of diverticulitis, arthritis, fibromyalgia, osteoarthritis, DJD presenting to the ED with leg swelling, increased shortness of breath that has been ongoing since Wednesday/Thursday. Stated that the swelling in her legs have remained the same. Patient reported that when she walks with her walker stated that she gets out of breath more easily. Stated that she does not use oxygen at home. Stated that 2-3 days ago she noticed bloating to her abdomen without pain - stated that her abdomen feels as "hard as a rock." Stated that her last BM was either yesterday or today. Reported that she is able to walk with a walker. Denied fever, chills, chest pain, difficulty breathing, cough, urinary complaints.  PCP Dr. Tenny Craw  Level 5 caveat  Past Medical History  Diagnosis Date  . Diverticulitis   . Arthritis   . Fibromyalgia   . Diverticulosis   . Hypothyroidism   . Osteoarthritis   . DJD (degenerative joint disease)    Past Surgical History  Procedure Laterality Date  . Ectopic pregnancy surgery    . Abdominal hysterectomy    . Abdominal adhesion surgery    . Knee arthroplasty     Family History  Problem Relation Age of Onset  . Prostate cancer     History  Substance Use Topics  . Smoking status: Former Smoker    Types: Cigarettes    Quit date: 06/30/1984  . Smokeless tobacco: Never Used  . Alcohol Use: Yes   OB History   Grav Para Term Preterm Abortions TAB SAB Ect Mult Living                 Review of Systems  Unable to perform ROS: Dementia  Constitutional:  Negative for fever and chills.  Respiratory: Positive for shortness of breath. Negative for cough and chest tightness.   Cardiovascular: Positive for leg swelling (Bilateral). Negative for chest pain.  Gastrointestinal: Positive for abdominal pain. Negative for nausea, vomiting, diarrhea, constipation, blood in stool and anal bleeding.      Allergies  Antihistamines, chlorpheniramine-type and Codeine  Home Medications   Prior to Admission medications   Medication Sig Start Date End Date Taking? Authorizing Provider  levothyroxine (SYNTHROID, LEVOTHROID) 100 MCG tablet Take 100 mcg by mouth. Take 100 mcg on Monday and Friday.  Take 112 mcg all other days.   Yes Historical Provider, MD  levothyroxine (SYNTHROID, LEVOTHROID) 112 MCG tablet Take 112 mcg by mouth. Take 112 mcg on Tuesday, Wednesday, Thursday, Saturday and Sunday.Take 100 mcg on other days.   Yes Historical Provider, MD  loperamide (IMODIUM A-D) 2 MG tablet Take 2 mg by mouth 4 (four) times daily as needed for diarrhea or loose stools.   Yes Historical Provider, MD  nabumetone (RELAFEN) 750 MG tablet Take 750 mg by mouth as needed for mild pain (as directed.).    Yes Historical Provider, MD  polyethylene glycol (MIRALAX / GLYCOLAX) packet Take 17 g by mouth daily as needed for mild constipation.    Yes Historical Provider, MD  traMADol (ULTRAM) 50 MG tablet Take 50-100 mg  by mouth. Maximum dose= 8 tablets per day   Yes Historical Provider, MD   BP 129/83  Pulse 83  Temp(Src) 97.6 F (36.4 C) (Oral)  Resp 18  SpO2 97% Physical Exam  Nursing note and vitals reviewed. Constitutional: She is oriented to person, place, and time. She appears well-developed and well-nourished. No distress.  HENT:  Head: Normocephalic and atraumatic.  Mouth/Throat: Oropharynx is clear and moist. No oropharyngeal exudate.  Eyes: Conjunctivae and EOM are normal. Pupils are equal, round, and reactive to light. Right eye exhibits no discharge. Left  eye exhibits no discharge.  Neck: Normal range of motion. Neck supple. No tracheal deviation present.  Cardiovascular: Normal rate, regular rhythm and normal heart sounds.  Exam reveals no friction rub.   No murmur heard. Pulses:      Radial pulses are 2+ on the right side, and 2+ on the left side.       Dorsalis pedis pulses are 2+ on the right side, and 2+ on the left side.  Cap refill less than 3 seconds 2+ pitting edema noted to the lower extremities bilaterally region from a dorsal aspects of the feet to just below the knees bilaterally-right more than the left  Pulmonary/Chest: Effort normal and breath sounds normal. No respiratory distress. She has no wheezes. She has no rales. She exhibits no tenderness.  Patient is able to speak in full sentences without difficulty  Negative use of accessory muscles Negative stridor  Abdominal: Soft. Bowel sounds are normal. She exhibits distension. There is tenderness. There is no rebound and no guarding.  Abdominal distention noted Mild decreased bowel sounds upper portion of the abdomen noted Abdomen heart upon palpation Negative peritoneal signs Negative rigidity noted  Musculoskeletal: Normal range of motion.  Full ROM to upper and lower extremities without difficulty noted, negative ataxia noted.  Lymphadenopathy:    She has no cervical adenopathy.  Neurological: She is alert and oriented to person, place, and time. No cranial nerve deficit. She exhibits normal muscle tone. Coordination normal.  Skin: Skin is warm and dry. No rash noted. She is not diaphoretic. No erythema.  Psychiatric: She has a normal mood and affect. Her behavior is normal. Thought content normal.    ED Course  Procedures (including critical care time)  8:05 PM This provider spoke with Dr. Arlyce Dice, GI physician. Discussed case in great detail, labs, imaging, vitals. Recommended that patient be placed NPO and GI tube placed - reported that patient will be seen in the  morning by GI.   8:29 PM This provider spoke with Dr. Teresa Coombs, physician - discussed case, labs, imaging, vitals, GI consult in great detail. Patient to be admitted to MedSurg.   Results for orders placed during the hospital encounter of 05/08/14  CBC WITH DIFFERENTIAL      Result Value Ref Range   WBC 4.8  4.0 - 10.5 K/uL   RBC 4.96  3.87 - 5.11 MIL/uL   Hemoglobin 14.8  12.0 - 15.0 g/dL   HCT 16.1  09.6 - 04.5 %   MCV 86.5  78.0 - 100.0 fL   MCH 29.8  26.0 - 34.0 pg   MCHC 34.5  30.0 - 36.0 g/dL   RDW 40.9  81.1 - 91.4 %   Platelets 410 (*) 150 - 400 K/uL   Neutrophils Relative % 69  43 - 77 %   Neutro Abs 3.3  1.7 - 7.7 K/uL   Lymphocytes Relative 17  12 - 46 %  Lymphs Abs 0.8  0.7 - 4.0 K/uL   Monocytes Relative 14 (*) 3 - 12 %   Monocytes Absolute 0.6  0.1 - 1.0 K/uL   Eosinophils Relative 0  0 - 5 %   Eosinophils Absolute 0.0  0.0 - 0.7 K/uL   Basophils Relative 0  0 - 1 %   Basophils Absolute 0.0  0.0 - 0.1 K/uL  COMPREHENSIVE METABOLIC PANEL      Result Value Ref Range   Sodium 133 (*) 137 - 147 mEq/L   Potassium 3.9  3.7 - 5.3 mEq/L   Chloride 91 (*) 96 - 112 mEq/L   CO2 24  19 - 32 mEq/L   Glucose, Bld 97  70 - 99 mg/dL   BUN 28 (*) 6 - 23 mg/dL   Creatinine, Ser 1.610.79  0.50 - 1.10 mg/dL   Calcium 9.4  8.4 - 09.610.5 mg/dL   Total Protein 7.0  6.0 - 8.3 g/dL   Albumin 3.5  3.5 - 5.2 g/dL   AST 28  0 - 37 U/L   ALT 18  0 - 35 U/L   Alkaline Phosphatase 60  39 - 117 U/L   Total Bilirubin 0.7  0.3 - 1.2 mg/dL   GFR calc non Af Amer 73 (*) >90 mL/min   GFR calc Af Amer 85 (*) >90 mL/min   Anion gap 18 (*) 5 - 15  TROPONIN I      Result Value Ref Range   Troponin I <0.30  <0.30 ng/mL  PRO B NATRIURETIC PEPTIDE      Result Value Ref Range   Pro B Natriuretic peptide (BNP) 1099.0 (*) 0 - 450 pg/mL  PROTIME-INR      Result Value Ref Range   Prothrombin Time 13.2  11.6 - 15.2 seconds   INR 0.99  0.00 - 1.49  LIPASE, BLOOD      Result Value Ref Range   Lipase  60 (*) 11 - 59 U/L   Labs Review Labs Reviewed  CBC WITH DIFFERENTIAL - Abnormal; Notable for the following:    Platelets 410 (*)    Monocytes Relative 14 (*)    All other components within normal limits  COMPREHENSIVE METABOLIC PANEL - Abnormal; Notable for the following:    Sodium 133 (*)    Chloride 91 (*)    BUN 28 (*)    GFR calc non Af Amer 73 (*)    GFR calc Af Amer 85 (*)    Anion gap 18 (*)    All other components within normal limits  PRO B NATRIURETIC PEPTIDE - Abnormal; Notable for the following:    Pro B Natriuretic peptide (BNP) 1099.0 (*)    All other components within normal limits  LIPASE, BLOOD - Abnormal; Notable for the following:    Lipase 60 (*)    All other components within normal limits  TROPONIN I  PROTIME-INR  URINALYSIS, ROUTINE W REFLEX MICROSCOPIC    Imaging Review Dg Chest 2 View  05/08/2014   CLINICAL DATA:  Bilateral leg edema, redness. CT bite on her left leg in may which is now a swelling  EXAM: CHEST  2 VIEW  COMPARISON:  03/22/2014  FINDINGS: Low lung volumes. Bibasilar atelectasis. Mild cardiomegaly. Large hiatal hernia. No effusions or acute bony abnormality.  IMPRESSION: Low lung volumes with bibasilar atelectasis.  Large hiatal hernia.   Electronically Signed   By: Charlett NoseKevin  Dover M.D.   On: 05/08/2014 17:13   Ct Abdomen Pelvis  W Contrast  05/08/2014   CLINICAL DATA:  Mid abdominal pain with nausea and distention for 3 days.  EXAM: CT ABDOMEN AND PELVIS WITH CONTRAST  TECHNIQUE: Multidetector CT imaging of the abdomen and pelvis was performed using the standard protocol following bolus administration of intravenous contrast.  CONTRAST:  50mL OMNIPAQUE IOHEXOL 300 MG/ML SOLN, 80mL OMNIPAQUE IOHEXOL 300 MG/ML SOLN  COMPARISON:  10/06/2013  FINDINGS: Lower chest:  Moderate-sized hiatal hernia.  Hepatobiliary: Gallbladder not well seen.  No biliary dilatation.  Pancreas: Mildly atrophic pancreas with a 1.2 by 0.7 cm hypodense lesion in the inferior  aspect of the pancreatic body, image 28 series 2,, probably similar to the 2010 exam and accordingly likely benign.  Spleen: Small residual splenic tissue in the medial left upper quadrant.  Adrenals/Urinary Tract: Fullness of both adrenal glands without discrete mass. Bilateral cysts. No definite hydronephrosis or stones. The ureters are difficult to follow due to paucity of intra-abdominal bowel gas and the highly distended bowel obscuring year ureteral margins. Suspected bladder diverticulum adjacent to the urethra compatible with cystocele.  Stomach/Bowel: Abnormal low position of the anorectal junction compatible with pelvic floor laxity. The extensive dilated colon with air-fluid levels. No discrete pneumatosis. Dilated loops of small bowel with air-fluid levels noted. The colonic dilatation has worsened compared to 10/06/2013  Vascular/Lymphatic: Aortoiliac atherosclerotic vascular disease.  Reproductive: Uterus absent.  Ovaries not well seen.  Other: Considerable diffuse subcutaneous edema most notable in the vicinity anatomic pelvis.  Musculoskeletal: Dextroconvex thoracolumbar scoliosis with rotary component. Bony demineralization. Lumbar spondylosis and degenerative disc disease causing multilevel foraminal impingement. Grade 1 anterolisthesis at L3-4 and L4-5.  IMPRESSION: 1. Extensive and worse and dilatation of small and large bowel with diffuse air-fluid levels. There is something of a transition point at the junction of the descending and sigmoid colon in terms of bowel caliber, and colonoscopy may be prudent to ensure that there is no actual obstruction in this vicinity, but this appearance could well be functional or due to ileus. 2. Extensive pelvic floor laxity with cystocele and low position of the anorectal junction. 3. Moderate-sized hiatal hernia. 4. Small approximately 1 cm hypodense lesion inferiorly along the pancreatic body, similar to the 2010 exam and likely benign or a very indolent. 5.  Considerable third spacing of fluid in the subcutaneous tissues. Paucity of intra-abdominal adipose tissue making assessment for mesenteric edema problematic. 6. Scoliosis, spondylosis, and degenerative disc disease causing multilevel impingement.   Electronically Signed   By: Herbie BaltimoreWalt  Liebkemann M.D.   On: 05/08/2014 18:22     EKG Interpretation None      MDM   Final diagnoses:  Ileus  Bilateral leg edema  Shortness of breath    Medications  iohexol (OMNIPAQUE) 300 MG/ML solution 50 mL (50 mLs Oral Contrast Given 05/08/14 1750)  iohexol (OMNIPAQUE) 300 MG/ML solution 100 mL (80 mLs Intravenous Contrast Given 05/08/14 1751)   Filed Vitals:   05/08/14 1528 05/08/14 1722 05/08/14 2010  BP: 118/80 118/82 129/83  Pulse: 85 89 83  Temp: 97.6 F (36.4 C)    TempSrc: Oral    Resp: 18 18 18   SpO2: 97% 96% 97%   EKG normal sinus rhythm with a heart rate of 83 beats per minute. Troponin negative elevation. PT INR within normal limits. BNP elevated at 1099.0. CBC unremarkable. CMP unremarkable. Lipase mildly elevated at 60. Chest x-ray noted low lung volumes with bibasilar atelectasis-large hiatal hernia. CT abdomen and pelvis with contrast noted extensive and worse dilation the  small large bowel with diffuse air-fluid levels-ileus noted. Small approximately 1 cm hypodense lesion inferiorly along the pancreatic body. Considerable third spacing of the fluid in the subcutaneous tissues-possible mesenteric edema problematic. Bilateral leg edema secondary to possible beginnings of CHF - possible CHF exacerbation. CT abdomen and pelvis with contrast noted beginnings of ileus. This provider spoke with Dr. Arlyce Dice who recommended patient to be left NPO and NG tube to be placed. Discussed plan for admission with patient who understands and agrees to plan of care. Patient admitted to MedSurg. Patient stable for transfer.  Raymon Mutton, PA-C 05/08/14 2032

## 2014-05-08 NOTE — ED Notes (Signed)
Unsuccessful ng tube insertion attempt.

## 2014-05-08 NOTE — ED Notes (Signed)
Pt attempt to void unsuccessful; will reassess.

## 2014-05-08 NOTE — ED Notes (Signed)
Pt is still unable to provide a urine sample.  

## 2014-05-08 NOTE — ED Provider Notes (Signed)
ECG no Muse available: Normal sinus rhythm, ventricular rate 83, normal axis, inverted T waves inferoanterolateral leads new compared with August 2015  Medical screening examination/treatment/procedure(s) were conducted as a shared visit with non-physician practitioner(s) and myself.  I personally evaluated the patient during the encounter.   EKG Interpretation None     Muse not working  80 year old female lives at home alone appears to have poor memory as she does not recall any of her recent ED visits in the last couple months for chronic leg swelling; patient denies chest pain cough shortness of breath he denies abdominal pain but states her abdomen is always somewhat tender and distended denies vomiting denies diarrhea; wants treatment for chronic swelling to her legs; lungs clear to auscultation unlabored abdomen is distended with bowel sounds present minimal diffuse tenderness without rebound extremities with mild to moderate lower extremity edema with chronic stasis changes without acute cellulitis noted, with dorsalis pedis pulses intact bilaterally normal light touch good movement to her feet capillary refill less than 2 seconds to both feet  Shelby HornJohn M Chukwuka Festa, MD 05/14/14 [phone]

## 2014-05-08 NOTE — H&P (Signed)
[facility] History and Physical  [name] HQI:[id number] DOB: Apr 08, 1926 DOA: 05/08/2014  Referring physician: Ashok CordiaMarissa PA [facility] PCP: Miguel AschoffOSS,ALLAN, MD  Specialists: GI  Chief Complaint: LE swelling  Assessment/Plan Active Problems: LE edema Chronic leg wound Obstipation/SBO Hypothyroidism Dementia hyponatremia  Ileus/Bowel obstruction: possible SBO based on CT (worse than last 09/2013). No nausea/emesis, possible obstipation. Pt endorses daily soft BM - but question accuracy of information due to pt mental status. Abdomen is distended and tight. No sign of SBP. NGtube placement unsuccessful. GI consulted by ED and will consult on 05/09/14. Lipase mildly elevated to 60.  - Admit to [facility] 8 - NPO. - Attempt NGTube again if develops nausea and vomiting - IVF NS 675ml/hr - Miralax - likely to need more aggressive bowel clean out.  - CMET in am  LE edema: at baseline per daughter. Unlikely CHF despite BNP 1099. Echo 08/2012 EF 50-55%. No O2 requirement, lungs clear, and no orthopnea. Likely secondary to venous insufficiency. Bilat so and chronic so unlikely DVT. Possibly made worse from mass effect on venous flow from large dialted loops of small and large bowel.  - compression stockings - monitor for fluid overload.  - IV lasix 20mg  x1  R leg wound: present for several months after initial injury from cat scratch. Treated as outpt and no longer on ABX. Slow healing primarily from edematous legs adn poor vascular flow. CUrrently nonttp w/o cellulitis - wound consult  Forgetfulness: likely with underlying undiagnosed dementia - Monitor for agitation - Trazodone PRN - recommend oupt eval for dementia  Hypothyroidism:  - continue home medications.   Hyponatremia: - NS as above - CMET in am  DVT Prophylaxis: Heparin Bryan TID   Code Status: DNR Family Communication: Daughter at bedside  Disposition Plan: Pending improvement  HPI: [name] is a 80  y.o. [facility] with  Swelling in her legs. Ongoing for a couple months but acutely worsened over the last couple of days. Associated w/ mild pain. Denies SOB, CP, Nausea, Vomiting, abd pain, fever, palpitations. Belly is at baseline very hard.   Review of Systems: Per HPI w/ all other systems negative.   Past Medical History  Diagnosis Date  . Diverticulitis   . Arthritis   . Fibromyalgia   . Diverticulosis   . Hypothyroidism   . Osteoarthritis   . DJD (degenerative joint disease)    Past Surgical History  Procedure Laterality Date  . Ectopic pregnancy surgery    . Abdominal hysterectomy    . Abdominal adhesion surgery    . Knee arthroplasty     Social History:  History   Social History Narrative  . No narrative on file    Allergies  Allergen Reactions  . Antihistamines, Chlorpheniramine-Type Other (See Comments)    headache  . Codeine     headache    Family History  Problem Relation Age of Onset  . Prostate cancer       Prior to Admission medications   Medication Sig Start Date End Date Taking? Authorizing Provider  levothyroxine (SYNTHROID, LEVOTHROID) 100 MCG tablet Take 100 mcg by mouth. Take 100 mcg on Monday and Friday.  Take 112 mcg all other days.   Yes Historical Provider, MD  levothyroxine (SYNTHROID, LEVOTHROID) 112 MCG tablet Take 112 mcg by mouth. Take 112 mcg on Tuesday, Wednesday, Thursday, Saturday and Sunday.Take 100 mcg on other days.   Yes Historical Provider, MD  loperamide (IMODIUM A-D) 2 MG  tablet Take 2 mg by mouth 4 (four) times daily as needed for diarrhea or loose stools.   Yes Historical Provider, MD  nabumetone (RELAFEN) 750 MG tablet Take 750 mg by mouth as needed for mild pain (as directed.).    Yes Historical Provider, MD  polyethylene glycol (MIRALAX / GLYCOLAX) packet Take 17 g by mouth daily as needed for mild constipation.    Yes Historical Provider, MD  traMADol (ULTRAM) 50 MG tablet Take 50-100 mg by mouth.  Maximum dose= 8 tablets per day   Yes Historical Provider, MD   Physical Exam: Filed Vitals:   05/08/14 1528 05/08/14 1722 05/08/14 2010  BP: 118/80 118/82 129/83  Pulse: 85 89 83  Temp: 97.6 F (36.4 C)    TempSrc: Oral    Resp: 18 18 18   SpO2: 97% 96% 97%     General:  NAD  Eyes: EOMI   ENT: mmm,  Neck: FROM  Cardiovascular: RRR, no m/r/g  Respiratory: Nml WOB. No wheezes, ronchi. Good breath sounds throughout  Abdomen: Distended adn tight to palpation. Nonttp, NABS  Skin: warm, well perfused, R lower leg w/ 2.5cm circular pink non-tender lesion w/ purulent exudate  Musculoskeletal: 3+ bilat Le edema, moves all extremities spontaneously   Psychiatric: nml affect  Neurologic: CN2-12 Grossly intact, cerebellar fxn nml, AOx3  Labs on Admission:  Basic Metabolic Panel:  Recent Labs Lab 05/08/14 1608  NA 133*  K 3.9  CL 91*  CO2 24  GLUCOSE 97  BUN 28*  CREATININE 0.79  CALCIUM 9.4   Liver Function Tests:  Recent Labs Lab 05/08/14 1608  AST 28  ALT 18  ALKPHOS 60  BILITOT 0.7  PROT 7.0  ALBUMIN 3.5    Recent Labs Lab 05/08/14 1608  LIPASE 60*   No results found for this basename: AMMONIA,  in the last 168 hours CBC:  Recent Labs Lab 05/08/14 1608  WBC 4.8  NEUTROABS 3.3  HGB 14.8  HCT 42.9  MCV 86.5  PLT 410*   Cardiac Enzymes:  Recent Labs Lab 05/08/14 1608  TROPONINI <0.30    BNP (last 3 results)  Recent Labs  03/22/14 1612 03/22/14 1628 05/08/14 1615  PROBNP 901.5* 893.5* 1099.0*   CBG: No results found for this basename: GLUCAP,  in the last 168 hours  Radiological Exams on Admission: Dg Chest 2 View  05/08/2014   CLINICAL DATA:  Bilateral leg edema, redness. CT bite on her left leg in may which is now a swelling  EXAM: CHEST  2 VIEW  COMPARISON:  03/22/2014  FINDINGS: Low lung volumes. Bibasilar atelectasis. Mild cardiomegaly. Large hiatal hernia. No effusions or acute bony abnormality.  IMPRESSION: Low lung  volumes with bibasilar atelectasis.  Large hiatal hernia.   Electronically Signed   By: Charlett NoseKevin  Dover M.D.   On: 05/08/2014 17:13   Ct Abdomen Pelvis W Contrast  05/08/2014   CLINICAL DATA:  Mid abdominal pain with nausea and distention for 3 days.  EXAM: CT ABDOMEN AND PELVIS WITH CONTRAST  TECHNIQUE: Multidetector CT imaging of the abdomen and pelvis was performed using the standard protocol following bolus administration of intravenous contrast.  CONTRAST:  50mL OMNIPAQUE IOHEXOL 300 MG/ML SOLN, 80mL OMNIPAQUE IOHEXOL 300 MG/ML SOLN  COMPARISON:  10/06/2013  FINDINGS: Lower chest:  Moderate-sized hiatal hernia.  Hepatobiliary: Gallbladder not well seen.  No biliary dilatation.  Pancreas: Mildly atrophic pancreas with a 1.2 by 0.7 cm hypodense lesion in the inferior aspect of the pancreatic body, image 28  series 2,, probably similar to the 2010 exam and accordingly likely benign.  Spleen: Small residual splenic tissue in the medial left upper quadrant.  Adrenals/Urinary Tract: Fullness of both adrenal glands without discrete mass. Bilateral cysts. No definite hydronephrosis or stones. The ureters are difficult to follow due to paucity of intra-abdominal bowel gas and the highly distended bowel obscuring year ureteral margins. Suspected bladder diverticulum adjacent to the urethra compatible with cystocele.  Stomach/Bowel: Abnormal low position of the anorectal junction compatible with pelvic floor laxity. The extensive dilated colon with air-fluid levels. No discrete pneumatosis. Dilated loops of small bowel with air-fluid levels noted. The colonic dilatation has worsened compared to 10/06/2013  Vascular/Lymphatic: Aortoiliac atherosclerotic vascular disease.  Reproductive: Uterus absent.  Ovaries not well seen.  Other: Considerable diffuse subcutaneous edema most notable in the vicinity anatomic pelvis.  Musculoskeletal: Dextroconvex thoracolumbar scoliosis with rotary component. Bony demineralization. Lumbar  spondylosis and degenerative disc disease causing multilevel foraminal impingement. Grade 1 anterolisthesis at L3-4 and L4-5.  IMPRESSION: 1. Extensive and worse and dilatation of small and large bowel with diffuse air-fluid levels. There is something of a transition point at the junction of the descending and sigmoid colon in terms of bowel caliber, and colonoscopy may be prudent to ensure that there is no actual obstruction in this vicinity, but this appearance could well be functional or due to ileus. 2. Extensive pelvic floor laxity with cystocele and low position of the anorectal junction. 3. Moderate-sized hiatal hernia. 4. Small approximately 1 cm hypodense lesion inferiorly along the pancreatic body, similar to the 2010 exam and likely benign or a very indolent. 5. Considerable third spacing of fluid in the subcutaneous tissues. Paucity of intra-abdominal adipose tissue making assessment for mesenteric edema problematic. 6. Scoliosis, spondylosis, and degenerative disc disease causing multilevel impingement.   Electronically Signed   By: Herbie Baltimore M.D.   On: 05/08/2014 18:22       Time spent: >70 min in direct pt care and coordination   Latorya Bautch J, MD [facility] [email or web address] Password [email or web address] 05/08/2014, 8:29 PM

## 2014-05-08 NOTE — ED Notes (Signed)
Patient is from home alone. She has bilateral leg edema with redness. She has cat bite to her left leg. She said this occurred in May, but the swelling has been over the past week. She denies shortness of breath now, but complains that her stomach is hard.

## 2014-05-08 NOTE — ED Notes (Signed)
[facility] Expected date:  Expected time:  Means of arrival:  Comments: EMS-weak

## 2014-05-09 ENCOUNTER — Encounter (HOSPITAL_COMMUNITY): Payer: Self-pay | Admitting: Surgery

## 2014-05-09 ENCOUNTER — Inpatient Hospital Stay (HOSPITAL_COMMUNITY): Payer: Medicare Other

## 2014-05-09 DIAGNOSIS — K56699 Other intestinal obstruction unspecified as to partial versus complete obstruction: Secondary | ICD-10-CM | POA: Diagnosis present

## 2014-05-09 DIAGNOSIS — M797 Fibromyalgia: Secondary | ICD-10-CM | POA: Diagnosis present

## 2014-05-09 DIAGNOSIS — K5730 Diverticulosis of large intestine without perforation or abscess without bleeding: Secondary | ICD-10-CM

## 2014-05-09 DIAGNOSIS — K5669 Other intestinal obstruction: Principal | ICD-10-CM

## 2014-05-09 HISTORY — DX: Diverticulosis of large intestine without perforation or abscess without bleeding: K57.30

## 2014-05-09 LAB — CBC
HEMATOCRIT: 43.3 % (ref 36.0–46.0)
Hemoglobin: 14.8 g/dL (ref 12.0–15.0)
MCH: 29.6 pg (ref 26.0–34.0)
MCHC: 34.2 g/dL (ref 30.0–36.0)
MCV: 86.6 fL (ref 78.0–100.0)
Platelets: 415 10*3/uL — ABNORMAL HIGH (ref 150–400)
RBC: 5 MIL/uL (ref 3.87–5.11)
RDW: 14.6 % (ref 11.5–15.5)
WBC: 5.3 10*3/uL (ref 4.0–10.5)

## 2014-05-09 LAB — COMPREHENSIVE METABOLIC PANEL
ALT: 17 U/L (ref 0–35)
ANION GAP: 15 (ref 5–15)
AST: 27 U/L (ref 0–37)
Albumin: 3.3 g/dL — ABNORMAL LOW (ref 3.5–5.2)
Alkaline Phosphatase: 58 U/L (ref 39–117)
BILIRUBIN TOTAL: 0.7 mg/dL (ref 0.3–1.2)
BUN: 29 mg/dL — AB (ref 6–23)
CHLORIDE: 90 meq/L — AB (ref 96–112)
CO2: 27 meq/L (ref 19–32)
CREATININE: 0.87 mg/dL (ref 0.50–1.10)
Calcium: 8.5 mg/dL (ref 8.4–10.5)
GFR calc Af Amer: 68 mL/min — ABNORMAL LOW (ref >90)
GFR calc non Af Amer: 59 mL/min — ABNORMAL LOW (ref >90)
GLUCOSE: 93 mg/dL (ref 70–99)
Potassium: 4 mEq/L (ref 3.7–5.3)
Sodium: 132 mEq/L — ABNORMAL LOW (ref 137–147)
Total Protein: 6.4 g/dL (ref 6.0–8.3)

## 2014-05-09 LAB — URINE MICROSCOPIC-ADD ON

## 2014-05-09 LAB — OCCULT BLOOD X 1 CARD TO LAB, STOOL: Fecal Occult Bld: NEGATIVE

## 2014-05-09 LAB — URINALYSIS, ROUTINE W REFLEX MICROSCOPIC
BILIRUBIN URINE: NEGATIVE
GLUCOSE, UA: NEGATIVE mg/dL
KETONES UR: NEGATIVE mg/dL
Nitrite: NEGATIVE
PH: 8.5 — AB (ref 5.0–8.0)
PROTEIN: 100 mg/dL — AB
Specific Gravity, Urine: >1.046 — ABNORMAL HIGH (ref 1.005–1.030)
Urobilinogen, UA: 1 mg/dL (ref 0.0–1.0)

## 2014-05-09 MED ORDER — LORAZEPAM 2 MG/ML IJ SOLN
1.0000 mg | Freq: Once | INTRAMUSCULAR | Status: AC
  Administered 2014-05-09: 1 mg via INTRAVENOUS
  Filled 2014-05-09: qty 1

## 2014-05-09 MED ORDER — DEXTROSE 5 % IV SOLN
1.0000 g | Freq: Every day | INTRAVENOUS | Status: DC
  Administered 2014-05-09 – 2014-05-13 (×6): 1 g via INTRAVENOUS
  Filled 2014-05-09 (×8): qty 10

## 2014-05-09 MED ORDER — CETYLPYRIDINIUM CHLORIDE 0.05 % MT LIQD
7.0000 mL | Freq: Two times a day (BID) | OROMUCOSAL | Status: DC
  Administered 2014-05-09 – 2014-05-19 (×14): 7 mL via OROMUCOSAL

## 2014-05-09 MED ORDER — CHLORHEXIDINE GLUCONATE 0.12 % MT SOLN
15.0000 mL | Freq: Two times a day (BID) | OROMUCOSAL | Status: DC
  Administered 2014-05-09 – 2014-05-19 (×16): 15 mL via OROMUCOSAL
  Filled 2014-05-09 (×22): qty 15

## 2014-05-09 MED ORDER — IOHEXOL 300 MG/ML  SOLN: 50.0000 mL | Freq: Once | INTRAMUSCULAR | Status: AC | PRN

## 2014-05-09 MED ORDER — INFLUENZA VAC SPLIT QUAD 0.5 ML IM SUSY
0.5000 mL | PREFILLED_SYRINGE | INTRAMUSCULAR | Status: DC
  Filled 2014-05-09 (×5): qty 0.5

## 2014-05-09 NOTE — Progress Notes (Deleted)
Report from Leonialarissa, Charity fundraiserN. Pt resting in bed, family bedside. C/o nausea, medicated w/ zofran as ordered. abd dermabond sites x 5 intact. POC discussed w/ family and pt.

## 2014-05-09 NOTE — Progress Notes (Addendum)
[facility]                                                                              Patient Demographics  [name], is a 80 y.o. female, DOB - 1925/10/06, WUJ:[id number]  Admit date - 05/08/2014   Admitting Physician Hurman HornJohn M Bednar, MD  [facility]  LOS - 1   Chief Complaint  Patient presents with  . Leg Swelling  . Bloated  . Shortness of Breath      Interim history 80 year old with a history of small bowel obstruction, arthritis, fibromyalgia, presents emergency department for leg swelling. Patient denies any abdominal pain she does state that she's had some vomiting but then changes her mind again. She is not the best historian. I did try calling several of her friends noted on the face sheet however unable to get in touch with her healthcare power of attorney. Patient has no living family members. At this time, it appears that she may have small bowel obstruction with ileus of the small and large bowel. General surgery as well as gastroenterology have been consulted.  Assessment & Plan   Abdominal distention secondary to ileus/bowel obstruction -CT of the abdomen: Extensive and worse and dilatation of small and large bowel with diffuse air-fluid levels.  -General surgery as well as gastroenterology have been consulted and appreciated. -Patient states she had a bowel movement approximately 2 days ago. -NG tube has not been placed the 2 on successful attempts, patient is to have it was by interventional radiology -Continue n.p.o. and IV fluids  Lower extremity edema -Patient is not at her baseline -BNP 1099 -Echocardiogram in February 2014 shows an EF of 50-55% -Patient does not appear to be volume overloaded, and does not have a formal diagnosis of CHF -Possibly secondary to venous insufficiency -Patient was given one dose of Lasix 20 mg IV in the emergency department -She does have good urine output -Will continue  to monitor  Urinary tract infection -continue ceftriaxone -urine culture pending  Right leg wound -Secondary to cat scratch that occurred back in May of 2015 -Patient has been treated with antibiotics an outpatient -Wound care consulted  Undiagnosed dementia -After speaking with several friends, patient does have memory problems -Patient will need outpatient followup evaluation for her dementia  Hypothyroidism -Continue Synthroid  Hyponatremia  -Continue IVF, continue to monitor her BMP  Code Status: DO NOT RESUSCITATE   Family Communication: None at bedside, spoke with several friends noted on her face she be a fall and   Disposition Plan: Admitted   Time Spent in minutes   60  minutes  Procedures  None   Consults   Gastroenterology General surgery   DVT Prophylaxis  heparin   Lab Results  Component Value Date   PLT 415* 05/09/2014    Medications  Scheduled Meds: . antiseptic oral rinse  7 mL Mouth Rinse q12n4p  . cefTRIAXone (ROCEPHIN)  IV  1 g Intravenous QHS  . chlorhexidine  15 mL Mouth Rinse BID  . heparin  5,000 Units Subcutaneous 3 times per day  . [START ON 05/12/2014] levothyroxine  100 mcg Oral Once per day  on Mon Fri  . levothyroxine  112 mcg Oral Once per day on Sun Tue Wed Thu Sat  . traZODone  50 mg Oral QHS   Continuous Infusions: . sodium chloride 75 mL/hr at 05/09/14 1040   PRN Meds:.acetaminophen, acetaminophen, ondansetron (ZOFRAN) IV, ondansetron, polyethylene glycol, traMADol  Antibiotics    Anti-infectives   Start     Dose/Rate Route Frequency Ordered Stop   05/09/14 0215  cefTRIAXone (ROCEPHIN) 1 g in dextrose 5 % 50 mL IVPB     1 g 100 mL/hr over 30 Minutes Intravenous Daily at bedtime 05/09/14 0203          Subjective:   Telicia Tegeler seen and examined today.  Patient denies abdominal pain, nausea, vomiting. She states she would like some coffee. She denies any pain at this time.   Objective:   Filed Vitals:    05/08/14 2010 05/08/14 2223 05/08/14 2230 05/09/14 0515  BP: 129/83 121/76  109/67  Pulse: 83 79  85  Temp:  97.7 F (36.5 C)  97.7 F (36.5 C)  TempSrc:  Oral  Oral  Resp: 18 18  18   Height:   5\' 5"  (1.651 m)   Weight:   63.504 kg (140 lb)   SpO2: 97% 97%  96%    Wt Readings from Last 3 Encounters:  05/08/14 63.504 kg (140 lb)  09/22/12 66.5 kg (146 lb 9.7 oz)  07/01/11 63.5 kg (139 lb 15.9 oz)     Intake/Output Summary (Last 24 hours) at 05/09/14 1154 Last data filed at 05/09/14 1000  Gross per 24 hour  Intake    875 ml  Output   1400 ml  Net   -525 ml    Exam  General: Well developed, well nourished, NAD, appears stated age  HEENT: NCAT, PERRLA, EOMI, Anicteic Sclera, mucous membranes moist.   Cardiovascular: S1 S2 auscultated, no rubs, murmurs or gallops. Regular rate and rhythm.  Respiratory: Clear to auscultation bilaterally with equal chest rise  Abdomen: Distended, +BS, nontender  Extremities: warm dry without cyanosis clubbing. + Edema in lower extremities bilaterally, small circular wound noted on the RLE  Neuro: AAOX2, cranial nerves grossly intact.   Data Review   Micro Results No results found for this or any previous visit (from the past 240 hour(s)).  Radiology Reports Dg Chest 2 View  05/08/2014   CLINICAL DATA:  Bilateral leg edema, redness. CT bite on her left leg in may which is now a swelling  EXAM: CHEST  2 VIEW  COMPARISON:  03/22/2014  FINDINGS: Low lung volumes. Bibasilar atelectasis. Mild cardiomegaly. Large hiatal hernia. No effusions or acute bony abnormality.  IMPRESSION: Low lung volumes with bibasilar atelectasis.  Large hiatal hernia.   Electronically Signed   By: Charlett Nose M.D.   On: 05/08/2014 17:13   Ct Abdomen Pelvis W Contrast  05/08/2014   CLINICAL DATA:  Mid abdominal pain with nausea and distention for 3 days.  EXAM: CT ABDOMEN AND PELVIS WITH CONTRAST  TECHNIQUE: Multidetector CT imaging of the abdomen and pelvis was  performed using the standard protocol following bolus administration of intravenous contrast.  CONTRAST:  50mL OMNIPAQUE IOHEXOL 300 MG/ML SOLN, 80mL OMNIPAQUE IOHEXOL 300 MG/ML SOLN  COMPARISON:  10/06/2013  FINDINGS: Lower chest:  Moderate-sized hiatal hernia.  Hepatobiliary: Gallbladder not well seen.  No biliary dilatation.  Pancreas: Mildly atrophic pancreas with a 1.2 by 0.7 cm hypodense lesion in the inferior aspect of the pancreatic body, image 28 series 2,, probably  similar to the 2010 exam and accordingly likely benign.  Spleen: Small residual splenic tissue in the medial left upper quadrant.  Adrenals/Urinary Tract: Fullness of both adrenal glands without discrete mass. Bilateral cysts. No definite hydronephrosis or stones. The ureters are difficult to follow due to paucity of intra-abdominal bowel gas and the highly distended bowel obscuring year ureteral margins. Suspected bladder diverticulum adjacent to the urethra compatible with cystocele.  Stomach/Bowel: Abnormal low position of the anorectal junction compatible with pelvic floor laxity. The extensive dilated colon with air-fluid levels. No discrete pneumatosis. Dilated loops of small bowel with air-fluid levels noted. The colonic dilatation has worsened compared to 10/06/2013  Vascular/Lymphatic: Aortoiliac atherosclerotic vascular disease.  Reproductive: Uterus absent.  Ovaries not well seen.  Other: Considerable diffuse subcutaneous edema most notable in the vicinity anatomic pelvis.  Musculoskeletal: Dextroconvex thoracolumbar scoliosis with rotary component. Bony demineralization. Lumbar spondylosis and degenerative disc disease causing multilevel foraminal impingement. Grade 1 anterolisthesis at L3-4 and L4-5.  IMPRESSION: 1. Extensive and worse and dilatation of small and large bowel with diffuse air-fluid levels. There is something of a transition point at the junction of the descending and sigmoid colon in terms of bowel caliber, and  colonoscopy may be prudent to ensure that there is no actual obstruction in this vicinity, but this appearance could well be functional or due to ileus. 2. Extensive pelvic floor laxity with cystocele and low position of the anorectal junction. 3. Moderate-sized hiatal hernia. 4. Small approximately 1 cm hypodense lesion inferiorly along the pancreatic body, similar to the 2010 exam and likely benign or a very indolent. 5. Considerable third spacing of fluid in the subcutaneous tissues. Paucity of intra-abdominal adipose tissue making assessment for mesenteric edema problematic. 6. Scoliosis, spondylosis, and degenerative disc disease causing multilevel impingement.   Electronically Signed   By: Herbie BaltimoreWalt  Liebkemann M.D.   On: 05/08/2014 18:22    CBC  Recent Labs Lab 05/08/14 1608 05/09/14 0510  WBC 4.8 5.3  HGB 14.8 14.8  HCT 42.9 43.3  PLT 410* 415*  MCV 86.5 86.6  MCH 29.8 29.6  MCHC 34.5 34.2  RDW 14.6 14.6  LYMPHSABS 0.8  --   MONOABS 0.6  --   EOSABS 0.0  --   BASOSABS 0.0  --     Chemistries   Recent Labs Lab 05/08/14 1608 05/09/14 0510  NA 133* 132*  K 3.9 4.0  CL 91* 90*  CO2 24 27  GLUCOSE 97 93  BUN 28* 29*  CREATININE 0.79 0.87  CALCIUM 9.4 8.5  AST 28 27  ALT 18 17  ALKPHOS 60 58  BILITOT 0.7 0.7   ------------------------------------------------------------------------------------------------------------------ estimated creatinine clearance is 41.8 ml/min (by C-G formula based on Cr of 0.87). ------------------------------------------------------------------------------------------------------------------ No results found for this basename: HGBA1C,  in the last 72 hours ------------------------------------------------------------------------------------------------------------------ No results found for this basename: CHOL, HDL, LDLCALC, TRIG, CHOLHDL, LDLDIRECT,  in the last 72  hours ------------------------------------------------------------------------------------------------------------------ No results found for this basename: TSH, T4TOTAL, FREET3, T3FREE, THYROIDAB,  in the last 72 hours ------------------------------------------------------------------------------------------------------------------ No results found for this basename: VITAMINB12, FOLATE, FERRITIN, TIBC, IRON, RETICCTPCT,  in the last 72 hours  Coagulation profile  Recent Labs Lab 05/08/14 1608  INR 0.99    No results found for this basename: DDIMER,  in the last 72 hours  Cardiac Enzymes  Recent Labs Lab 05/08/14 1608  TROPONINI <0.30   ------------------------------------------------------------------------------------------------------------------ No components found with this basename: POCBNP,     Khalel Alms D.O. on 05/09/2014 at 11:54  AM  Between 7am to 7pm - Pager - [phone]  After 7pm go to [email or web address] - password [email or web address]  And look for the night coverage person covering for me after hours  [facility] Office  [phone]

## 2014-05-09 NOTE — Consult Note (Signed)
Reason for Consult: Bowel obstruction Referring Physician: Dr. Audrea Muscat Mikhail  [name] is an 80 y.o. female.  HPI: Pt admitted last PM from ED with complaints of leg swelling.  I cannot get much history from her. She denies any abdominal pain, but she has had some vomiting.  She says she had a BM last Friday, but she thinks that was 2 days ago.  When you ask about her history she does not remember anything.  Work up in the ED show Na is low, BNP is elevated, CBC is normal. +UTI with culture pending.  She has a large hiatal hernia, CT scan shows extensive Small and large bowel distension.  With transition in the descending and sigmoid colon.  They have been unable to get an NG in her and she is set up for IR to do later.  She is extremely distended but it doesn't seem to bother her very much.  She mostly wants to eat.  You cannot reason with her even with this marked distension she does not understand, which make me wonder how long she has been this way.  Past Medical History  Diagnosis Date  . Diverticulitis/multiple diverticular bleeds 2012     Hx of SBO   Arthritis   Fibromyalgia   Diverticulosis   Hypothyroidism   Osteoarthritis   DJD (degenerative joint disease)     Past Surgical History  Procedure Laterality Date  . Ectopic pregnancy surgery    . Abdominal hysterectomy    . Abdominal adhesion surgery    . Knee arthroplasty      Family History  Problem Relation Age of Onset  . Prostate cancer      Social History:  reports that she quit smoking about 29 years ago. Her smoking use included Cigarettes. She smoked 0.00 packs per day. She has never used smokeless tobacco. She reports that she drinks alcohol. She reports that she does not use illicit drugs.  Allergies:  Allergies  Allergen Reactions  . Antihistamines, Chlorpheniramine-Type Other (See Comments)    headache  . Codeine     headache    Medications:  Prior to Admission:  Prescriptions prior to  admission  Medication Sig Dispense Refill  . levothyroxine (SYNTHROID, LEVOTHROID) 100 MCG tablet Take 100 mcg by mouth. Take 100 mcg on Monday and Friday.  Take 112 mcg all other days.      Marland Kitchen levothyroxine (SYNTHROID, LEVOTHROID) 112 MCG tablet Take 112 mcg by mouth. Take 112 mcg on Tuesday, Wednesday, Thursday, Saturday and Sunday.Take 100 mcg on other days.      Marland Kitchen loperamide (IMODIUM A-D) 2 MG tablet Take 2 mg by mouth 4 (four) times daily as needed for diarrhea or loose stools.      . nabumetone (RELAFEN) 750 MG tablet Take 750 mg by mouth as needed for mild pain (as directed.).       Marland Kitchen polyethylene glycol (MIRALAX / GLYCOLAX) packet Take 17 g by mouth daily as needed for mild constipation.       . traMADol (ULTRAM) 50 MG tablet Take 50-100 mg by mouth. Maximum dose= 8 tablets per day       Scheduled: . antiseptic oral rinse  7 mL Mouth Rinse q12n4p  . cefTRIAXone (ROCEPHIN)  IV  1 g Intravenous QHS  . chlorhexidine  15 mL Mouth Rinse BID  . heparin  5,000 Units Subcutaneous 3 times per day  . [START ON 05/12/2014] levothyroxine  100 mcg Oral Once per day on Mon  Fri  . levothyroxine  112 mcg Oral Once per day on Sun Tue Wed Thu Sat  . traZODone  50 mg Oral QHS   Continuous: . sodium chloride 75 mL/hr at 05/09/14 1040   PJK:[id number], acetaminophen, ondansetron (ZOFRAN) IV, ondansetron, polyethylene glycol, traMADol Anti-infectives   Start     Dose/Rate Route Frequency Ordered Stop   05/09/14 0215  cefTRIAXone (ROCEPHIN) 1 g in dextrose 5 % 50 mL IVPB     1 g 100 mL/hr over 30 Minutes Intravenous Daily at bedtime 05/09/14 0203        Results for orders placed during the hospital encounter of 05/08/14 (from the past 48 hour(s))  CBC WITH DIFFERENTIAL     Status: Abnormal   Collection Time    05/08/14  4:08 PM      Result Value Ref Range   WBC 4.8  4.0 - 10.5 K/uL   RBC 4.96  3.87 - 5.11 MIL/uL   Hemoglobin 14.8  12.0 - 15.0 g/dL   HCT 42.9  36.0 - 46.0 %   MCV 86.5  78.0  - 100.0 fL   MCH 29.8  26.0 - 34.0 pg   MCHC 34.5  30.0 - 36.0 g/dL   RDW 14.6  11.5 - 15.5 %   Platelets 410 (*) 150 - 400 K/uL   Neutrophils Relative % 69  43 - 77 %   Neutro Abs 3.3  1.7 - 7.7 K/uL   Lymphocytes Relative 17  12 - 46 %   Lymphs Abs 0.8  0.7 - 4.0 K/uL   Monocytes Relative 14 (*) 3 - 12 %   Monocytes Absolute 0.6  0.1 - 1.0 K/uL   Eosinophils Relative 0  0 - 5 %   Eosinophils Absolute 0.0  0.0 - 0.7 K/uL   Basophils Relative 0  0 - 1 %   Basophils Absolute 0.0  0.0 - 0.1 K/uL  COMPREHENSIVE METABOLIC PANEL     Status: Abnormal   Collection Time    05/08/14  4:08 PM      Result Value Ref Range   Sodium 133 (*) 137 - 147 mEq/L   Potassium 3.9  3.7 - 5.3 mEq/L   Chloride 91 (*) 96 - 112 mEq/L   CO2 24  19 - 32 mEq/L   Glucose, Bld 97  70 - 99 mg/dL   BUN 28 (*) 6 - 23 mg/dL   Creatinine, Ser 0.79  0.50 - 1.10 mg/dL   Calcium 9.4  8.4 - 10.5 mg/dL   Total Protein 7.0  6.0 - 8.3 g/dL   Albumin 3.5  3.5 - 5.2 g/dL   AST 28  0 - 37 U/L   ALT 18  0 - 35 U/L   Alkaline Phosphatase 60  39 - 117 U/L   Total Bilirubin 0.7  0.3 - 1.2 mg/dL   GFR calc non Af Amer 73 (*) >90 mL/min   GFR calc Af Amer 85 (*) >90 mL/min   Comment: (NOTE)     The eGFR has been calculated using the CKD EPI equation.     This calculation has not been validated in all clinical situations.     eGFR's persistently <90 mL/min signify possible Chronic Kidney     Disease.   Anion gap 18 (*) 5 - 15  TROPONIN I     Status: None   Collection Time    05/08/14  4:08 PM      Result Value Ref Range   Troponin  I <0.30  <0.30 ng/mL   Comment:            Due to the release kinetics of cTnI,     a negative result within the first hours     of the onset of symptoms does not rule out     myocardial infarction with certainty.     If myocardial infarction is still suspected,     repeat the test at appropriate intervals.  PROTIME-INR     Status: None   Collection Time    05/08/14  4:08 PM      Result  Value Ref Range   Prothrombin Time 13.2  11.6 - 15.2 seconds   INR 0.99  0.00 - 1.49  LIPASE, BLOOD     Status: Abnormal   Collection Time    05/08/14  4:08 PM      Result Value Ref Range   Lipase 60 (*) 11 - 59 U/L  PRO B NATRIURETIC PEPTIDE     Status: Abnormal   Collection Time    05/08/14  4:15 PM      Result Value Ref Range   Pro B Natriuretic peptide (BNP) 1099.0 (*) 0 - 450 pg/mL  URINALYSIS, ROUTINE W REFLEX MICROSCOPIC     Status: Abnormal   Collection Time    05/08/14 10:58 PM      Result Value Ref Range   Color, Urine AMBER (*) YELLOW   Comment: BIOCHEMICALS MAY BE AFFECTED BY COLOR   APPearance TURBID (*) CLEAR   Specific Gravity, Urine >1.046 (*) 1.005 - 1.030   pH 8.5 (*) 5.0 - 8.0   Glucose, UA NEGATIVE  NEGATIVE mg/dL   Hgb urine dipstick LARGE (*) NEGATIVE   Bilirubin Urine NEGATIVE  NEGATIVE   Ketones, ur NEGATIVE  NEGATIVE mg/dL   Protein, ur 100 (*) NEGATIVE mg/dL   Urobilinogen, UA 1.0  0.0 - 1.0 mg/dL   Nitrite NEGATIVE  NEGATIVE   Leukocytes, UA MODERATE (*) NEGATIVE  URINE MICROSCOPIC-ADD ON     Status: Abnormal   Collection Time    05/08/14 10:58 PM      Result Value Ref Range   Squamous Epithelial / LPF RARE  RARE   WBC, UA TOO NUMEROUS TO COUNT  <3 WBC/hpf   RBC / HPF TOO NUMEROUS TO COUNT  <3 RBC/hpf   Bacteria, UA MANY (*) RARE   Casts GRANULAR CAST (*) NEGATIVE   Crystals TRIPLE PHOSPHATE CRYSTALS (*) NEGATIVE  CBC     Status: Abnormal   Collection Time    05/09/14  5:10 AM      Result Value Ref Range   WBC 5.3  4.0 - 10.5 K/uL   RBC 5.00  3.87 - 5.11 MIL/uL   Hemoglobin 14.8  12.0 - 15.0 g/dL   HCT 43.3  36.0 - 46.0 %   MCV 86.6  78.0 - 100.0 fL   MCH 29.6  26.0 - 34.0 pg   MCHC 34.2  30.0 - 36.0 g/dL   RDW 14.6  11.5 - 15.5 %   Platelets 415 (*) 150 - 400 K/uL  COMPREHENSIVE METABOLIC PANEL     Status: Abnormal   Collection Time    05/09/14  5:10 AM      Result Value Ref Range   Sodium 132 (*) 137 - 147 mEq/L   Potassium 4.0   3.7 - 5.3 mEq/L   Chloride 90 (*) 96 - 112 mEq/L   CO2 27  19 - 32 mEq/L  Glucose, Bld 93  70 - 99 mg/dL   BUN 29 (*) 6 - 23 mg/dL   Creatinine, Ser 0.87  0.50 - 1.10 mg/dL   Calcium 8.5  8.4 - 10.5 mg/dL   Total Protein 6.4  6.0 - 8.3 g/dL   Albumin 3.3 (*) 3.5 - 5.2 g/dL   AST 27  0 - 37 U/L   ALT 17  0 - 35 U/L   Alkaline Phosphatase 58  39 - 117 U/L   Total Bilirubin 0.7  0.3 - 1.2 mg/dL   GFR calc non Af Amer 59 (*) >90 mL/min   GFR calc Af Amer 68 (*) >90 mL/min   Comment: (NOTE)     The eGFR has been calculated using the CKD EPI equation.     This calculation has not been validated in all clinical situations.     eGFR's persistently <90 mL/min signify possible Chronic Kidney     Disease.   Anion gap 15  5 - 15    Dg Chest 2 View  05/08/2014   CLINICAL DATA:  Bilateral leg edema, redness. CT bite on her left leg in may which is now a swelling  EXAM: CHEST  2 VIEW  COMPARISON:  03/22/2014  FINDINGS: Low lung volumes. Bibasilar atelectasis. Mild cardiomegaly. Large hiatal hernia. No effusions or acute bony abnormality.  IMPRESSION: Low lung volumes with bibasilar atelectasis.  Large hiatal hernia.   Electronically Signed   By: Rolm Baptise M.D.   On: 05/08/2014 17:13   Ct Abdomen Pelvis W Contrast  05/08/2014   CLINICAL DATA:  Mid abdominal pain with nausea and distention for 3 days.  EXAM: CT ABDOMEN AND PELVIS WITH CONTRAST  TECHNIQUE: Multidetector CT imaging of the abdomen and pelvis was performed using the standard protocol following bolus administration of intravenous contrast.  CONTRAST:  61m OMNIPAQUE IOHEXOL 300 MG/ML SOLN, 88mOMNIPAQUE IOHEXOL 300 MG/ML SOLN  COMPARISON:  10/06/2013  FINDINGS: Lower chest:  Moderate-sized hiatal hernia.  Hepatobiliary: Gallbladder not well seen.  No biliary dilatation.  Pancreas: Mildly atrophic pancreas with a 1.2 by 0.7 cm hypodense lesion in the inferior aspect of the pancreatic body, image 28 series 2,, probably similar to the 2010  exam and accordingly likely benign.  Spleen: Small residual splenic tissue in the medial left upper quadrant.  Adrenals/Urinary Tract: Fullness of both adrenal glands without discrete mass. Bilateral cysts. No definite hydronephrosis or stones. The ureters are difficult to follow due to paucity of intra-abdominal bowel gas and the highly distended bowel obscuring year ureteral margins. Suspected bladder diverticulum adjacent to the urethra compatible with cystocele.  Stomach/Bowel: Abnormal low position of the anorectal junction compatible with pelvic floor laxity. The extensive dilated colon with air-fluid levels. No discrete pneumatosis. Dilated loops of small bowel with air-fluid levels noted. The colonic dilatation has worsened compared to 10/06/2013  Vascular/Lymphatic: Aortoiliac atherosclerotic vascular disease.  Reproductive: Uterus absent.  Ovaries not well seen.  Other: Considerable diffuse subcutaneous edema most notable in the vicinity anatomic pelvis.  Musculoskeletal: Dextroconvex thoracolumbar scoliosis with rotary component. Bony demineralization. Lumbar spondylosis and degenerative disc disease causing multilevel foraminal impingement. Grade 1 anterolisthesis at L3-4 and L4-5.  IMPRESSION: 1. Extensive and worse and dilatation of small and large bowel with diffuse air-fluid levels. There is something of a transition point at the junction of the descending and sigmoid colon in terms of bowel caliber, and colonoscopy may be prudent to ensure that there is no actual obstruction in this vicinity, but this  appearance could well be functional or due to ileus. 2. Extensive pelvic floor laxity with cystocele and low position of the anorectal junction. 3. Moderate-sized hiatal hernia. 4. Small approximately 1 cm hypodense lesion inferiorly along the pancreatic body, similar to the 2010 exam and likely benign or a very indolent. 5. Considerable third spacing of fluid in the subcutaneous tissues. Paucity of  intra-abdominal adipose tissue making assessment for mesenteric edema problematic. 6. Scoliosis, spondylosis, and degenerative disc disease causing multilevel impingement.   Electronically Signed   By: Sherryl Barters M.D.   On: 05/08/2014 18:22    Review of Systems  Unable to perform ROS: mental acuity   Blood pressure 109/67, pulse 85, temperature 97.7 F (36.5 C), temperature source Oral, resp. rate 18, height 5' 5" (1.651 m), weight 63.504 kg (140 lb), SpO2 96.00%. Physical Exam  Constitutional: No distress.  Frail elderly WF in no distress, just wants to eat or at least a cup of coffee.  Does not seem bothered by her significant stomach distension.  HENT:  Head: Normocephalic and atraumatic.  Nose: Nose normal.  Eyes: Conjunctivae are normal. Pupils are equal, round, and reactive to light. Right eye exhibits no discharge. Left eye exhibits no discharge. No scleral icterus.  Neck: Normal range of motion. Neck supple. No JVD present. No tracheal deviation present. No thyromegaly present.  Cardiovascular: Normal rate, regular rhythm and normal heart sounds.   Respiratory: No respiratory distress. She has no wheezes. She has no rales. She exhibits no tenderness.  GI: She exhibits distension (She has marked abdominal distension, but no pain and she does not seem to even mildly uncomfortable with this.). She exhibits no mass. There is no tenderness. There is no rebound and no guarding.  She is very distended and bowel sounds hyperactive.  Genitourinary:  Rectal exam was normal.  Soft stool in the vault, no blood, it has been sent for occult exam.  Musculoskeletal: She exhibits edema (This was her admitting and only complaint.  ). She exhibits no tenderness.  bandaide on left lower leg, she says this is a cat bite that hasn't healed in 2 years.  Lymphadenopathy:    She has no cervical adenopathy.  Neurological: She is alert.  She is alert and answering questions but she does not remember  anything.   Skin: Skin is warm. No rash noted. She is not diaphoretic. No erythema. No pallor.  Psychiatric: She has a normal mood and affect.  She lives alone no children only a cat according to her.  She does not know the day or when her stomach became bloated.  She says it does not bother her even now.  She says her last BM was a couple days ago, on Friday.  She was not aware of the day.    Assessment/Plan: 1.  Severe colonic obstruction 2.  Hx of diverticulitis and multiple diverticular bleeds in the past. 3.  Lower extremity swelling 4.  Memory loss/possible dementia  5.  Hx of hypothyroid 6.  Hyponatremia   Plan:  I have called dr. Ree Kida and she has already talked with Dr. Cristina Gong about GI consult, she needs colonoscopy to rule out possible obstructing cancer.  She is currently refusing NG.  We are worried about perforation at this point and will follow with you.  She also has significant difficulty with memory and really understanding her current conidtion.  , 05/09/2014, 10:54 AM

## 2014-05-09 NOTE — Consult Note (Signed)
Referring Provider: Dr. Edsel Petrin Primary Care Physician:   Duane Lope, MD Primary Gastroenterologist:  Dr. Randa Evens  Reason for Consultation:  Intestinal distention  HPI: [name] is a 80 y.o. female admitted to the hospital yesterday. The patient is currently sundowning, disoriented, and inappropriate, and is not able to give reliable history, so most of the history is obtained from the patient's friend, [name], as well as from the referring physician and the hospital record.  Yesterday, the patient telephoned her friend, as she has done on many times in the past, complaining about her stomach having a "knot" in it as well as having lower extremity edema, prompting an ambulance trip to the hospital, where she was admitted.  Her admission CT showed evidence of significant dilatation of both the small bowel and large bowel, with apparent tapering and distal decompression beginning in the sigmoid region, raising the question of a possible mass or stricture in that area. Review of previous CTs from earlier this year and from a year ago show a progressive tendency toward intestinal dilatation, in the past involving just the colon, but currently involving both the small bowel and the colon.  The patient has a history of extensive and severe diverticulosis, recurrent diverticulitis, and diverticular bleeding (her office record indicates that she has received 20 units of blood cumulatively for her diverticular bleeding), with her most recent colonoscopy 4-1/2 years ago by Dr. Carman Ching having been negative for polypoid lesions, although it was technically a difficult exam due to her anatomy.  Interestingly, according to her friend, the patient was not really complaining of abdominal pain, but rather, she complained of a hard, palpable "knot" on her abdominal wall. Apparently, there has been no vomiting. The patient, by her report, has a significant tendency toward diarrhea, and  indeed, no stool is evident on her CT scan, although both the small and large bowel are filled with fluid.  Since coming into the hospital, attempts have been made both by the floor staff and by the interventional radiologist to pass an NG tube, but without success. The patient has not been cooperative and instead has been somewhat combative during these attempts.  The patient has been seen by general surgery who feel that she could potentially be a candidate for a colon resection, which might entail creation of a colostomy, but they need first both medical clearance and some direction as to goals of care in this elderly, somewhat frail patient who lives alone and apparently spends a lot of time in bed, with her diet consisting largely of cheese and bologna.  Of note, she has no family and no children. Her second cousin, [name], is the patient's healthcare power of attorney and apparently is coming down from [place] tomorrow, with an anticipated arrival time around 1 PM.   Past Medical History  Diagnosis Date  . Arthritis   . Fibromyalgia   . Diverticulosis   . Hypothyroidism   . Osteoarthritis   . DJD (degenerative joint disease)   . Diverticulitis 07/01/2011  . Diverticulosis of sigmoid colon 05/09/2014    Past Surgical History  Procedure Laterality Date  . Ectopic pregnancy surgery    . Abdominal hysterectomy    . Abdominal adhesion surgery    . Knee arthroplasty    . Colonoscopy  2011    Dr [facility]    Prior to Admission medications   Medication Sig Start Date End Date Taking? Authorizing Provider  levothyroxine (SYNTHROID, LEVOTHROID) 100  MCG tablet Take 100 mcg by mouth. Take 100 mcg on Monday and Friday.  Take 112 mcg all other days.   Yes Historical Provider, MD  levothyroxine (SYNTHROID, LEVOTHROID) 112 MCG tablet Take 112 mcg by mouth. Take 112 mcg on Tuesday, Wednesday, Thursday, Saturday and Sunday.Take 100 mcg on other days.   Yes Historical  Provider, MD  loperamide (IMODIUM A-D) 2 MG tablet Take 2 mg by mouth 4 (four) times daily as needed for diarrhea or loose stools.   Yes Historical Provider, MD  nabumetone (RELAFEN) 750 MG tablet Take 750 mg by mouth as needed for mild pain (as directed.).    Yes Historical Provider, MD  polyethylene glycol (MIRALAX / GLYCOLAX) packet Take 17 g by mouth daily as needed for mild constipation.    Yes Historical Provider, MD  traMADol (ULTRAM) 50 MG tablet Take 50-100 mg by mouth. Maximum dose= 8 tablets per day   Yes Historical Provider, MD    Current Facility-Administered Medications  Medication Dose Route Frequency Provider Last Rate Last Dose  . 0.9 %  sodium chloride infusion   Intravenous Continuous Ozella Rocks, MD 75 mL/hr at 05/09/14 1040    . acetaminophen (TYLENOL) tablet 650 mg  650 mg Oral Q6H PRN Ozella Rocks, MD       Or  . acetaminophen (TYLENOL) suppository 650 mg  650 mg Rectal Q6H PRN Ozella Rocks, MD      . antiseptic oral rinse (CPC / CETYLPYRIDINIUM CHLORIDE 0.05%) solution 7 mL  7 mL Mouth Rinse q12n4p Rodolph Bong, MD   7 mL at 05/09/14 1559  . cefTRIAXone (ROCEPHIN) 1 g in dextrose 5 % 50 mL IVPB  1 g Intravenous QHS Julian Crowford Darlina Guys., RPH   1 g at 05/09/14 4098  . chlorhexidine (PERIDEX) 0.12 % solution 15 mL  15 mL Mouth Rinse BID Rodolph Bong, MD   15 mL at 05/09/14 0914  . heparin injection 5,000 Units  5,000 Units Subcutaneous 3 times per day Ozella Rocks, MD   5,000 Units at 05/09/14 1351  . [START ON 05/10/2014] Influenza vac split quadrivalent PF (FLUARIX) injection 0.5 mL  0.5 mL Intramuscular Tomorrow-1000 Maryann Mikhail, DO      . iohexol (OMNIPAQUE) 300 MG/ML solution 50 mL  50 mL Oral Once PRN Medication Radiologist, MD      . Melene Muller ON 05/12/2014] levothyroxine (SYNTHROID, LEVOTHROID) tablet 100 mcg  100 mcg Oral Once per day on Mon Fri Ozella Rocks, MD      . levothyroxine (SYNTHROID, LEVOTHROID) tablet 112 mcg  112 mcg  Oral Once per day on Sun Tue Wed Thu Sat Ozella Rocks, MD      . ondansetron [facility]) tablet 4 mg  4 mg Oral Q6H PRN Ozella Rocks, MD       Or  . ondansetron [facility]) injection 4 mg  4 mg Intravenous Q6H PRN Ozella Rocks, MD      . polyethylene glycol (MIRALAX / GLYCOLAX) packet 17 g  17 g Oral Daily PRN Ozella Rocks, MD      . traMADol Janean Sark) tablet 50-100 mg  50-100 mg Oral Q6H PRN Ozella Rocks, MD      . traZODone (DESYREL) tablet 50 mg  50 mg Oral QHS Ozella Rocks, MD        Allergies as of 05/08/2014 - Review Complete 05/08/2014  Allergen Reaction Noted  . Antihistamines, chlorpheniramine-type Other (See Comments) 06/30/2011  .  Codeine  06/30/2011    Family History  Problem Relation Age of Onset  . Prostate cancer      History   Social History  . Marital Status: Widowed    Spouse Name: N/A    Number of Children: N/A  . Years of Education: N/A   Occupational History  . Not on file.   Social History Main Topics  . Smoking status: Former Smoker    Types: Cigarettes    Quit date: 06/30/1984  . Smokeless tobacco: Never Used  . Alcohol Use: Yes  . Drug Use: No  . Sexual Activity: No   Other Topics Concern  . Not on file   Social History Narrative  . No narrative on file    Pertinent for tendency for diarrhea, spending a lot of time in bed, and recent progression of lower tract that he edema. Please see history of present illness for further details. A detailed review of systems is not available from this patient who has cognitive dysfunction at the present time.  Physical Exam: Vital signs in last 24 hours: Temp:  [97.7 F (36.5 C)-98.2 F (36.8 C)] 98.2 F (36.8 C) (10/13 1400) Pulse Rate:  [79-90] 90 (10/13 1400) Resp:  [18] 18 (10/13 1400) BP: (109-121)/(67-79) 117/79 mmHg (10/13 1400) SpO2:  [96 %-98 %] 98 % (10/13 1400) Weight:  [63.504 kg (140 lb)] 63.504 kg (140 lb) (10/12 2230)   This is a reasonably well preserved, and  non-cachectic, fairly well-nourished Caucasian female. She is restless, lying in bed and trying to sit up. She is in no evident distress. She is speaking somewhat incoherently, but she did answer at least one question appropriately, namely, that she did not have any children or family members.  She is anicteric and without frank pallor. The skin is warm and dry. There are no obvious musculoskeletal abnormalities, joint effusions, or skin abnormalities. The chest is grossly clear. The heart is without evident murmurs or arrhythmias. The abdomen is moderately distended, very firm, and severely tympanitic. She has palpable loops of bowel pushing against the abdominal wall, without evident ventral herniation very bowel sounds are very active, with gurgles and tinkles. No bruit heard. No discrete mass felt. The patient does not have obvious cranial nerve or focal motor deficits. The patient is without clubbing or cyanosis, but does have impressive, chronic-appearing edema of the lower extremities, probably 2-3+ in severity.  Intake/Output from previous day: 10/12 0701 - 10/13 0700 In: 575 [I.V.:525; IV Piggyback:50] Out: 1300 [Urine:1300] Intake/Output this shift:    Lab Results:  Recent Labs  05/08/14 1608 05/09/14 0510  WBC 4.8 5.3  HGB 14.8 14.8  HCT 42.9 43.3  PLT 410* 415*   BMET  Recent Labs  05/08/14 1608 05/09/14 0510  NA 133* 132*  K 3.9 4.0  CL 91* 90*  CO2 24 27  GLUCOSE 97 93  BUN 28* 29*  CREATININE 0.79 0.87  CALCIUM 9.4 8.5   LFT  Recent Labs  05/09/14 0510  PROT 6.4  ALBUMIN 3.3*  AST 27  ALT 17  ALKPHOS 58  BILITOT 0.7   PT/INR  Recent Labs  05/08/14 1608  LABPROT 13.2  INR 0.99    Studies/Results: Dg Chest 2 View  05/08/2014   CLINICAL DATA:  Bilateral leg edema, redness. CT bite on her left leg in may which is now a swelling  EXAM: CHEST  2 VIEW  COMPARISON:  03/22/2014  FINDINGS: Low lung volumes. Bibasilar atelectasis. Mild cardiomegaly.  Large  hiatal hernia. No effusions or acute bony abnormality.  IMPRESSION: Low lung volumes with bibasilar atelectasis.  Large hiatal hernia.   Electronically Signed   By: Charlett Nose M.D.   On: 05/08/2014 17:13   Ct Abdomen Pelvis W Contrast  05/08/2014   CLINICAL DATA:  Mid abdominal pain with nausea and distention for 3 days.  EXAM: CT ABDOMEN AND PELVIS WITH CONTRAST  TECHNIQUE: Multidetector CT imaging of the abdomen and pelvis was performed using the standard protocol following bolus administration of intravenous contrast.  CONTRAST:  50mL OMNIPAQUE IOHEXOL 300 MG/ML SOLN, 80mL OMNIPAQUE IOHEXOL 300 MG/ML SOLN  COMPARISON:  10/06/2013  FINDINGS: Lower chest:  Moderate-sized hiatal hernia.  Hepatobiliary: Gallbladder not well seen.  No biliary dilatation.  Pancreas: Mildly atrophic pancreas with a 1.2 by 0.7 cm hypodense lesion in the inferior aspect of the pancreatic body, image 28 series 2,, probably similar to the 2010 exam and accordingly likely benign.  Spleen: Small residual splenic tissue in the medial left upper quadrant.  Adrenals/Urinary Tract: Fullness of both adrenal glands without discrete mass. Bilateral cysts. No definite hydronephrosis or stones. The ureters are difficult to follow due to paucity of intra-abdominal bowel gas and the highly distended bowel obscuring year ureteral margins. Suspected bladder diverticulum adjacent to the urethra compatible with cystocele.  Stomach/Bowel: Abnormal low position of the anorectal junction compatible with pelvic floor laxity. The extensive dilated colon with air-fluid levels. No discrete pneumatosis. Dilated loops of small bowel with air-fluid levels noted. The colonic dilatation has worsened compared to 10/06/2013  Vascular/Lymphatic: Aortoiliac atherosclerotic vascular disease.  Reproductive: Uterus absent.  Ovaries not well seen.  Other: Considerable diffuse subcutaneous edema most notable in the vicinity anatomic pelvis.  Musculoskeletal:  Dextroconvex thoracolumbar scoliosis with rotary component. Bony demineralization. Lumbar spondylosis and degenerative disc disease causing multilevel foraminal impingement. Grade 1 anterolisthesis at L3-4 and L4-5.  IMPRESSION: 1. Extensive and worse and dilatation of small and large bowel with diffuse air-fluid levels. There is something of a transition point at the junction of the descending and sigmoid colon in terms of bowel caliber, and colonoscopy may be prudent to ensure that there is no actual obstruction in this vicinity, but this appearance could well be functional or due to ileus. 2. Extensive pelvic floor laxity with cystocele and low position of the anorectal junction. 3. Moderate-sized hiatal hernia. 4. Small approximately 1 cm hypodense lesion inferiorly along the pancreatic body, similar to the 2010 exam and likely benign or a very indolent. 5. Considerable third spacing of fluid in the subcutaneous tissues. Paucity of intra-abdominal adipose tissue making assessment for mesenteric edema problematic. 6. Scoliosis, spondylosis, and degenerative disc disease causing multilevel impingement.   Electronically Signed   By: Herbie Baltimore M.D.   On: 05/08/2014 18:22   Dg Fluoro Rm 1-60 Min  05/09/2014   CLINICAL DATA:  Small bowel obstruction  EXAM: FLOURO RM 1-60 MIN ; UNSUCCESSFUL NASOGASTRIC TUBE PLACEMENT ATTEMPT  FLUOROSCOPY TIME:  1 min, 25 seconds  COMPARISON:  Chest radiograph 05/08/2014  FINDINGS: Multiple attempts to place the nasogastric tube were unsuccessful. The tube preferentially selects the airway, or coils in the pharynx. The patient is unable to cooperate with tube placement and became combative. The use of fluoroscopy does not help in getting the tube to select the esophagus. Endoscopic placement would likely be required to successfully select the esophagus.  IMPRESSION: 1. Unsuccessful nasogastric tube placement. The tube preferentially selects the airway or coils in the pharynx.  The patient became  combative and further attempts and further radiation exposure were not felt to be likely to succeed. Consider endoscopic placement.   Electronically Signed   By: Herbie BaltimoreWalt  Liebkemann M.D.   On: 05/09/2014 17:20    Impression: 1. Intestinal dilatation, ileus versus obstruction. The very active bowel sounds would make me favor obstruction overt ileus. If so, the obstruction appears to be in the region of the sigmoid colon. Given that she had a negative colonoscopy through this area less than 5 years ago, and considering that she has a known benign cause for stricturing in this area (her severe diverticulosis), I would favor this being a benign process rather than a malignancy. 2. Lower extremity edema 3. Cognitive dysfunction with disorientation, acute on chronic. There is probably an element of "sundowning," with the patient's friend indicates that she has been quite forgetful in recent weeks  Plan: 1. Await direction from the patient's healthcare power of attorney, who will hopefully be available for conversation tomorrow 2. If treatment is desired, I think it would be reasonable to attempt a decompressive colonoscopy. While sedated for that procedure, possibly an NG tube could be placed for the purpose of decompression from above. 3. I agree with the surgical consultant, Dr.Gross, that this patient probably needs resection of at least a portion of her colon to solve her problem. In the past, this was recommended to her, in the context of her recurrent diverticular complications, but when this was most recently addressed in the office by her primary gastroenterologist 2-1/2 years ago, the patient was not at all inclined toward surgery, but of course that was a different set of issues at that time.   LOS: 1 day   Perry Molla V  05/09/2014, 8:13 PM

## 2014-05-09 NOTE — Consult Note (Signed)
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 Tumor at left colon.  Colonoscopy 4-1/2 years ago looked okay.  Agree with nasogastric tube decompression.  Stomach seems rather distended.  Hopefully better luck in fluoroscopy.  I am concerned about the insight of this patient.  What is her quality of life?  What her goals of care given her dementia?  If she wishes to be aggressive, could consider surgical exploration if she has medical clearance.  At this point, would most likely require colostomy given massive colon distention.  If colonoscopy and get across the stricture and dilated to allow her to be prepped, maybe can do anastomosis.  We will see.  The patient is stable.  There is no evidence of peritonitis, acute abdomen, nor shock.  There is no strong evidence of failure of improvement nor decline with current non-operative management.  There is no need for emergent surgery at the present moment.  We will continue to follow.  Ardeth SportsmanSteven C. Miachel Nardelli, M.D., F.A.C.S. Gastrointestinal and Minimally Invasive Surgery [facility] [address] [place], [place] [address] [phone] Main / Paging

## 2014-05-09 NOTE — Progress Notes (Signed)
ANTIBIOTIC CONSULT NOTE - INITIAL  Pharmacy Consult for Ceftriaxone Indication: UTI  Allergies  Allergen Reactions  . Antihistamines, Chlorpheniramine-Type Other (See Comments)    headache  . Codeine     headache    Patient Measurements: Height: 5\' 5"  (165.1 cm) Weight: 140 lb (63.504 kg) IBW/kg (Calculated) : 57 Adjusted Body Weight:   Vital Signs: Temp: 97.7 F (36.5 C) (10/12 2223) Temp Source: Oral (10/12 2223) BP: 121/76 mmHg (10/12 2223) Pulse Rate: 79 (10/12 2223) Intake/Output from previous day: 10/12 0701 - 10/13 0700 In: 50 [IV Piggyback:50] Out: 1100 [Urine:1100] Intake/Output from this shift: Total I/O In: 50 [IV Piggyback:50] Out: 1100 [Urine:1100]  Labs:  Recent Labs  05/08/14 1608  WBC 4.8  HGB 14.8  PLT 410*  CREATININE 0.79   Estimated Creatinine Clearance: 45.4 ml/min (by C-G formula based on Cr of 0.79). No results found for this basename: VANCOTROUGH, VANCOPEAK, VANCORANDOM, GENTTROUGH, GENTPEAK, GENTRANDOM, TOBRATROUGH, TOBRAPEAK, TOBRARND, AMIKACINPEAK, AMIKACINTROU, AMIKACIN,  in the last 72 hours   Microbiology: No results found for this or any previous visit (from the past 720 hour(s)).  Medical History: Past Medical History  Diagnosis Date  . Diverticulitis   . Arthritis   . Fibromyalgia   . Diverticulosis   . Hypothyroidism   . Osteoarthritis   . DJD (degenerative joint disease)     Medications:  Anti-infectives   Start     Dose/Rate Route Frequency Ordered Stop   05/09/14 0215  cefTRIAXone (ROCEPHIN) 1 g in dextrose 5 % 50 mL IVPB     1 g 100 mL/hr over 30 Minutes Intravenous Daily at bedtime 05/09/14 0203       Assessment: Patient with UTI.    Goal of Therapy:  Rocephin based on manufacturer dosing recommendations.  Plan: Ceftriaxone 1gm iv q24hr Follow up culture results  Aleene DavidsonGrimsley Jr, Sherrill Mckamie Crowford 05/09/2014,4:36 AM

## 2014-05-10 DIAGNOSIS — H9190 Unspecified hearing loss, unspecified ear: Secondary | ICD-10-CM

## 2014-05-10 DIAGNOSIS — F0390 Unspecified dementia without behavioral disturbance: Secondary | ICD-10-CM | POA: Diagnosis present

## 2014-05-10 DIAGNOSIS — E876 Hypokalemia: Secondary | ICD-10-CM

## 2014-05-10 DIAGNOSIS — F329 Major depressive disorder, single episode, unspecified: Secondary | ICD-10-CM

## 2014-05-10 LAB — CBC
HCT: 39.1 % (ref 36.0–46.0)
Hemoglobin: 13.1 g/dL (ref 12.0–15.0)
MCH: 29.4 pg (ref 26.0–34.0)
MCHC: 33.5 g/dL (ref 30.0–36.0)
MCV: 87.9 fL (ref 78.0–100.0)
PLATELETS: 405 10*3/uL — AB (ref 150–400)
RBC: 4.45 MIL/uL (ref 3.87–5.11)
RDW: 14.6 % (ref 11.5–15.5)
WBC: 6.1 10*3/uL (ref 4.0–10.5)

## 2014-05-10 LAB — BASIC METABOLIC PANEL
ANION GAP: 17 — AB (ref 5–15)
BUN: 30 mg/dL — AB (ref 6–23)
CALCIUM: 8.7 mg/dL (ref 8.4–10.5)
CO2: 26 meq/L (ref 19–32)
CREATININE: 0.81 mg/dL (ref 0.50–1.10)
Chloride: 91 mEq/L — ABNORMAL LOW (ref 96–112)
GFR calc Af Amer: 74 mL/min — ABNORMAL LOW (ref >90)
GFR calc non Af Amer: 64 mL/min — ABNORMAL LOW (ref >90)
Glucose, Bld: 82 mg/dL (ref 70–99)
Potassium: 3.3 mEq/L — ABNORMAL LOW (ref 3.7–5.3)
Sodium: 134 mEq/L — ABNORMAL LOW (ref 137–147)

## 2014-05-10 LAB — SURGICAL PCR SCREEN
MRSA, PCR: NEGATIVE
STAPHYLOCOCCUS AUREUS: NEGATIVE

## 2014-05-10 LAB — HEMOGLOBIN A1C
Hgb A1c MFr Bld: 5.9 % — ABNORMAL HIGH (ref <5.7)
MEAN PLASMA GLUCOSE: 123 mg/dL — AB (ref <117)

## 2014-05-10 MED ORDER — CHLORHEXIDINE GLUCONATE 4 % EX LIQD
60.0000 mL | Freq: Once | CUTANEOUS | Status: AC
  Administered 2014-05-10: 4 via TOPICAL
  Filled 2014-05-10: qty 60

## 2014-05-10 MED ORDER — POTASSIUM CHLORIDE 10 MEQ/100ML IV SOLN
10.0000 meq | INTRAVENOUS | Status: AC
  Administered 2014-05-10 (×3): 10 meq via INTRAVENOUS
  Filled 2014-05-10 (×3): qty 100

## 2014-05-10 MED ORDER — ALVIMOPAN 12 MG PO CAPS
12.0000 mg | ORAL_CAPSULE | Freq: Once | ORAL | Status: DC
  Filled 2014-05-10 (×2): qty 1

## 2014-05-10 MED ORDER — DEXTROSE 5 % IV SOLN
2.0000 g | INTRAVENOUS | Status: DC
  Filled 2014-05-10: qty 2

## 2014-05-10 MED ORDER — CHLORHEXIDINE GLUCONATE 4 % EX LIQD
60.0000 mL | Freq: Once | CUTANEOUS | Status: AC
  Administered 2014-05-11: 4 via TOPICAL
  Filled 2014-05-10 (×3): qty 60

## 2014-05-10 MED ORDER — SODIUM CHLORIDE 0.9 % IV BOLUS (SEPSIS)
250.0000 mL | Freq: Once | INTRAVENOUS | Status: AC
  Administered 2014-05-10: 250 mL via INTRAVENOUS

## 2014-05-10 MED ORDER — ALVIMOPAN 12 MG PO CAPS
12.0000 mg | ORAL_CAPSULE | ORAL | Status: DC
  Filled 2014-05-10 (×4): qty 1

## 2014-05-10 NOTE — Progress Notes (Signed)
Clinical Social Work Department CLINICAL SOCIAL WORK PSYCHIATRY SERVICE LINE ASSESSMENT 05/10/2014  Patient:  [name]  Account:  [id number]  Admit Date:  05/08/2014  Clinical Social Worker:  Unk LightningHOLLY Geraldo Haris, LCSW  Date/Time:  05/10/2014 10:30 AM Referred by:  Physician  Date referred:  05/10/2014 Reason for Referral  Psychosocial assessment   Presenting Symptoms/Problems (In the person's/family's own words):   Psych consulted to determine capacity.   Abuse/Neglect/Trauma History (check all that apply)  Denies history   Abuse/Neglect/Trauma Comments:   Psychiatric History (check all that apply)  Denies history   Psychiatric medications:  None currently   Current Mental Health Hospitalizations/Previous Mental Health History:   Patient denies any MH.   Current provider:   None   Place and Date:   N/A   Current Medications:   Scheduled Meds:      . antiseptic oral rinse  7 mL Mouth Rinse q12n4p  . cefTRIAXone (ROCEPHIN)  IV  1 g Intravenous QHS  . chlorhexidine  15 mL Mouth Rinse BID  . heparin  5,000 Units Subcutaneous 3 times per day  . Influenza vac split quadrivalent PF  0.5 mL Intramuscular Tomorrow-1000  . [START ON 05/12/2014] levothyroxine  100 mcg Oral Once per day on Mon Fri  . levothyroxine  112 mcg Oral Once per day on Sun Tue Wed Thu Sat  . potassium chloride  10 mEq Intravenous Q1 Hr x 3  . sodium chloride  250 mL Intravenous Once  . traZODone  50 mg Oral QHS        Continuous Infusions:      . sodium chloride 75 mL/hr at 05/10/14 0944          PRN Meds:.acetaminophen, acetaminophen, ondansetron (ZOFRAN) IV, ondansetron, polyethylene glycol, traMADol       Previous Impatient Admission/Date/Reason:   None reported   Emotional Health / Current Symptoms    Suicide/Self Harm  None reported   Suicide attempt in the past:   Patient denies any SI or HI.   Other harmful behavior:   None reported   Psychotic/Dissociative Symptoms  None reported    Other Psychotic/Dissociative Symptoms:    Attention/Behavioral Symptoms  Within Normal Limits   Other Attention / Behavioral Symptoms:   Patient engaged but has to be redirected often.    Cognitive Impairment  Within Normal Limits   Other Cognitive Impairment:   Patient alert and oriented. Patient knows that it is May 10, 2014 but thought it was Tuesday instead of Wednesday.    Mood and Adjustment  Flat    Stress, Anxiety, Trauma, Any Recent Loss/Stressor  None reported   Anxiety (frequency):   N/A   Phobia (specify):   N/A   Compulsive behavior (specify):   N/A   Obsessive behavior (specify):   N/A   Other:   N/A   Substance Abuse/Use  History of substance use   SBIRT completed (please refer for detailed history):  N  Self-reported substance use:   Patient reports she was drinking 2 shots of liquor before dinner each night but stopped drinking about 7 weeks ago. Patient did not have a reason of why she stopped drinking.   Urinary Drug Screen Completed:  N Alcohol level:   N/A    Environmental/Housing/Living Arrangement  Stable housing   Who is in the home:   Alone   Emergency contact:  Leamon ArntMelanie Jones-[facility]CPOA [phone]   Financial  Medicare   Patient's Strengths and Goals (patient's own words):   Patient reports  supportive friends that live locally.   Clinical Social Worker's Interpretive Summary:   CSW received referral in order to complete psychosocial assessment. CSW reviewed chart and spoke with bedside RN prior to meeting with patient. Patient laying in bed and agreeable to assessment.    CSW introduced myself and explained role. Patient reports she lives alone and has been widowed for the past 26 years. CSW inquired about how husband passed away but patient could not remember. Patient does not have any children and reports no family other than cousins. Patient reports she is still independent and drives and cooks. Patient reports a  housekeeper comes and assists her throughout the day as well.    CSW administered a mini-mental status exam (MMSE) and patient scored 21/30. Patient did well with oriented but struggled with memory. Patient reports that she had been drinking in the past but decided to quit drinking and is unable to identify if any behavior/memory changes have been affected since quitting drinking.  Patient denies any MH diagnosis and reports no treatment.    Patient disengaged at times and had to be redirected. Patient is hard of hearing and CSW had to repeat questions several times. CSW will staff case with psych MD and assist with recommendations provided.   Disposition:  Recommend Psych CSW continuing to support while in hospital   DelavanHolly Juanantonio Stolar, [place] [phone]

## 2014-05-10 NOTE — Progress Notes (Signed)
Patient WJ:[id number]:[name]      DOB: [birth year]      GNF:[id number]  Reviewed chart with newest conversation between Surgery and patient's POA.  Patient deemed to have medical capacity for decision making per psych eval.  I have attempted x2  [facility] and have left one voice message.  Plan is for surgical intervention.  Will ask the PMT to contact POA for meeting post op since decisions made, to assist with goals going forward.  Suspect Friday morning will be the best time to reeval.  Froylan Hobby L. Ladona Ridgelaylor, MD MBA The Palliative Medicine Team at [facility] Team Phone: [phone] Pager: [phone] ( Use team phone after hours)

## 2014-05-10 NOTE — Progress Notes (Signed)
CSW received referral requesting CSW to assist in locating pt family.  CSW reviewed chart and noted that per MD note, Her second cousin, Shellee MiloDonna Leesburrg, is the patient's healthcare power of attorney and apparently is coming down from [place] tomorrow, with an anticipated arrival time around 1 PM.   Psych consulted for capacity as well.  No further social work needs identified at this time.  Please re-consult if further social work needs arise.  Loletta SpecterSuzanna Nelida Mandarino, MSW, LCSW Clinical Social Work [phone]

## 2014-05-10 NOTE — Consult Note (Signed)
[name]   Reason for Munoz:  dementia, nausea,SBO and abdominal pain Referring Physician:  Cristal Ford, DO  [name] is an 80 y.o. female. Total Time spent with patient: 30 minutes  Assessment: AXIS I:  Depressive Disorder NOS and Dementia AXIS II:  Deferred AXIS III:   Past Medical History  Diagnosis Date  . Arthritis   . Fibromyalgia   . Diverticulosis   . Hypothyroidism   . Osteoarthritis   . DJD (degenerative joint disease)   . Diverticulitis 07/01/2011  . Diverticulosis of sigmoid colon 05/09/2014   AXIS IV:  other psychosocial or environmental problems, problems related to social environment and problems with primary support group AXIS V:  51-60 moderate symptoms  Plan:  Patient has capacity to make her own medical decisions based on my evaluation She has hearing impairment, uses hearing aids and memory deficits No evidence of imminent risk to self or others at present.   Patient does not meet criteria for psychiatric inpatient admission. Supportive therapy provided about ongoing stressors. Appreciate psychiatric consultation Please contact [phone] if needs further assistance  Subjective:   [name] is a 80 y.o. female patient admitted with dementia, nausea,SBO and abdominal pain.  HPI:  Patient is seen face to face for psychiatric consultation capacity evaluation. Denied Patient has been living independently and has few friends locally, and has help for cleaning her home. Reportedly patient is still driving locally. Patient has intact cognition except memory and recall three words given. She is well aware of her medical problems and recommended treatments. She is fairly in good health until recently. She definitely benefits from SNF with rehabilitation after hospitalization. Psych social service has been involved and has plans of talking with patient [facility] for firm treatment needs and disposition plans. She has no history of  psychiatric illness and psych hospitalization. She is know alcohol drinker at supper but no signs of abuse or dependence.     80 year old with a history of small bowel obstruction, arthritis, fibromyalgia, presents emergency department for leg swelling. Patient denies any abdominal pain she does state that she's had some vomiting but then changes her mind again. She is not the best historian. I did try calling several of her friends noted on the face sheet however unable to get in touch with her healthcare power of attorney. Patient has no living family members. At this time, it appears that she may have small bowel obstruction with ileus of the small and large bowel. General surgery as well as gastroenterology have been consulted.  HPI Elements:   Location:  dementia. Quality:  poor. Severity:  chronic. Timing:  worsening of medical problems and poor social support.  Past Psychiatric History: Past Medical History  Diagnosis Date  . Arthritis   . Fibromyalgia   . Diverticulosis   . Hypothyroidism   . Osteoarthritis   . DJD (degenerative joint disease)   . Diverticulitis 07/01/2011  . Diverticulosis of sigmoid colon 05/09/2014    reports that she quit smoking about 29 years ago. Her smoking use included Cigarettes. She smoked 0.00 packs per day. She has never used smokeless tobacco. She reports that she drinks alcohol. She reports that she does not use illicit drugs. Family History  Problem Relation Age of Onset  . Prostate cancer       Living Arrangements: Alone   Abuse/Neglect [facility]) Physical Abuse: Denies Verbal Abuse: Denies Sexual Abuse: Denies Allergies:   Allergies  Allergen Reactions  . Antihistamines, Chlorpheniramine-Type  Other (See Comments)    headache  . Codeine     headache    ACT Assessment Complete:  NO Objective: Blood pressure 141/78, pulse 95, temperature 98 F (36.7 C), temperature source Axillary, resp. rate 17, height 5' 5"  (1.651 m), weight 63.504 kg  (140 lb), SpO2 91.00%.Body mass index is 23.3 kg/(m^2). Results for orders placed during the hospital encounter of 05/08/14 (from the past 72 hour(s))  CBC WITH DIFFERENTIAL     Status: Abnormal   Collection Time    05/08/14  4:08 PM      Result Value Ref Range   WBC 4.8  4.0 - 10.5 K/uL   RBC 4.96  3.87 - 5.11 MIL/uL   Hemoglobin 14.8  12.0 - 15.0 g/dL   HCT 42.9  36.0 - 46.0 %   MCV 86.5  78.0 - 100.0 fL   MCH 29.8  26.0 - 34.0 pg   MCHC 34.5  30.0 - 36.0 g/dL   RDW 14.6  11.5 - 15.5 %   Platelets 410 (*) 150 - 400 K/uL   Neutrophils Relative % 69  43 - 77 %   Neutro Abs 3.3  1.7 - 7.7 K/uL   Lymphocytes Relative 17  12 - 46 %   Lymphs Abs 0.8  0.7 - 4.0 K/uL   Monocytes Relative 14 (*) 3 - 12 %   Monocytes Absolute 0.6  0.1 - 1.0 K/uL   Eosinophils Relative 0  0 - 5 %   Eosinophils Absolute 0.0  0.0 - 0.7 K/uL   Basophils Relative 0  0 - 1 %   Basophils Absolute 0.0  0.0 - 0.1 K/uL  COMPREHENSIVE METABOLIC PANEL     Status: Abnormal   Collection Time    05/08/14  4:08 PM      Result Value Ref Range   Sodium 133 (*) 137 - 147 mEq/L   Potassium 3.9  3.7 - 5.3 mEq/L   Chloride 91 (*) 96 - 112 mEq/L   CO2 24  19 - 32 mEq/L   Glucose, Bld 97  70 - 99 mg/dL   BUN 28 (*) 6 - 23 mg/dL   Creatinine, Ser 0.79  0.50 - 1.10 mg/dL   Calcium 9.4  8.4 - 10.5 mg/dL   Total Protein 7.0  6.0 - 8.3 g/dL   Albumin 3.5  3.5 - 5.2 g/dL   AST 28  0 - 37 U/L   ALT 18  0 - 35 U/L   Alkaline Phosphatase 60  39 - 117 U/L   Total Bilirubin 0.7  0.3 - 1.2 mg/dL   GFR calc non Af Amer 73 (*) >90 mL/min   GFR calc Af Amer 85 (*) >90 mL/min   Comment: (NOTE)     The eGFR has been calculated using the CKD EPI equation.     This calculation has not been validated in all clinical situations.     eGFR's persistently <90 mL/min signify possible Chronic Kidney     Disease.   Anion gap 18 (*) 5 - 15  TROPONIN I     Status: None   Collection Time    05/08/14  4:08 PM      Result Value Ref Range    Troponin I <0.30  <0.30 ng/mL   Comment:            Due to the release kinetics of cTnI,     a negative result within the first hours     of  the onset of symptoms does not rule out     myocardial infarction with certainty.     If myocardial infarction is still suspected,     repeat the test at appropriate intervals.  PROTIME-INR     Status: None   Collection Time    05/08/14  4:08 PM      Result Value Ref Range   Prothrombin Time 13.2  11.6 - 15.2 seconds   INR 0.99  0.00 - 1.49  LIPASE, BLOOD     Status: Abnormal   Collection Time    05/08/14  4:08 PM      Result Value Ref Range   Lipase 60 (*) 11 - 59 U/L  PRO B NATRIURETIC PEPTIDE     Status: Abnormal   Collection Time    05/08/14  4:15 PM      Result Value Ref Range   Pro B Natriuretic peptide (BNP) 1099.0 (*) 0 - 450 pg/mL  URINALYSIS, ROUTINE W REFLEX MICROSCOPIC     Status: Abnormal   Collection Time    05/08/14 10:58 PM      Result Value Ref Range   Color, Urine AMBER (*) YELLOW   Comment: BIOCHEMICALS MAY BE AFFECTED BY COLOR   APPearance TURBID (*) CLEAR   Specific Gravity, Urine >1.046 (*) 1.005 - 1.030   pH 8.5 (*) 5.0 - 8.0   Glucose, UA NEGATIVE  NEGATIVE mg/dL   Hgb urine dipstick LARGE (*) NEGATIVE   Bilirubin Urine NEGATIVE  NEGATIVE   Ketones, ur NEGATIVE  NEGATIVE mg/dL   Protein, ur 100 (*) NEGATIVE mg/dL   Urobilinogen, UA 1.0  0.0 - 1.0 mg/dL   Nitrite NEGATIVE  NEGATIVE   Leukocytes, UA MODERATE (*) NEGATIVE  URINE MICROSCOPIC-ADD ON     Status: Abnormal   Collection Time    05/08/14 10:58 PM      Result Value Ref Range   Squamous Epithelial / LPF RARE  RARE   WBC, UA TOO NUMEROUS TO COUNT  <3 WBC/hpf   RBC / HPF TOO NUMEROUS TO COUNT  <3 RBC/hpf   Bacteria, UA MANY (*) RARE   Casts GRANULAR CAST (*) NEGATIVE   Crystals TRIPLE PHOSPHATE CRYSTALS (*) NEGATIVE  CBC     Status: Abnormal   Collection Time    05/09/14  5:10 AM      Result Value Ref Range   WBC 5.3  4.0 - 10.5 K/uL   RBC 5.00   3.87 - 5.11 MIL/uL   Hemoglobin 14.8  12.0 - 15.0 g/dL   HCT 43.3  36.0 - 46.0 %   MCV 86.6  78.0 - 100.0 fL   MCH 29.6  26.0 - 34.0 pg   MCHC 34.2  30.0 - 36.0 g/dL   RDW 14.6  11.5 - 15.5 %   Platelets 415 (*) 150 - 400 K/uL  COMPREHENSIVE METABOLIC PANEL     Status: Abnormal   Collection Time    05/09/14  5:10 AM      Result Value Ref Range   Sodium 132 (*) 137 - 147 mEq/L   Potassium 4.0  3.7 - 5.3 mEq/L   Chloride 90 (*) 96 - 112 mEq/L   CO2 27  19 - 32 mEq/L   Glucose, Bld 93  70 - 99 mg/dL   BUN 29 (*) 6 - 23 mg/dL   Creatinine, Ser 0.87  0.50 - 1.10 mg/dL   Calcium 8.5  8.4 - 10.5 mg/dL   Total Protein 6.4  6.0 - 8.3 g/dL   Albumin 3.3 (*) 3.5 - 5.2 g/dL   AST 27  0 - 37 U/L   ALT 17  0 - 35 U/L   Alkaline Phosphatase 58  39 - 117 U/L   Total Bilirubin 0.7  0.3 - 1.2 mg/dL   GFR calc non Af Amer 59 (*) >90 mL/min   GFR calc Af Amer 68 (*) >90 mL/min   Comment: (NOTE)     The eGFR has been calculated using the CKD EPI equation.     This calculation has not been validated in all clinical situations.     eGFR's persistently <90 mL/min signify possible Chronic Kidney     Disease.   Anion gap 15  5 - 15  OCCULT BLOOD X 1 CARD TO LAB, STOOL     Status: None   Collection Time    05/09/14 10:54 AM      Result Value Ref Range   Fecal Occult Bld NEGATIVE  NEGATIVE  CBC     Status: Abnormal   Collection Time    05/10/14  4:55 AM      Result Value Ref Range   WBC 6.1  4.0 - 10.5 K/uL   RBC 4.45  3.87 - 5.11 MIL/uL   Hemoglobin 13.1  12.0 - 15.0 g/dL   HCT 39.1  36.0 - 46.0 %   MCV 87.9  78.0 - 100.0 fL   MCH 29.4  26.0 - 34.0 pg   MCHC 33.5  30.0 - 36.0 g/dL   RDW 14.6  11.5 - 15.5 %   Platelets 405 (*) 150 - 400 K/uL  BASIC METABOLIC PANEL     Status: Abnormal   Collection Time    05/10/14  4:55 AM      Result Value Ref Range   Sodium 134 (*) 137 - 147 mEq/L   Potassium 3.3 (*) 3.7 - 5.3 mEq/L   Comment: DELTA CHECK NOTED   Chloride 91 (*) 96 - 112 mEq/L   CO2  26  19 - 32 mEq/L   Glucose, Bld 82  70 - 99 mg/dL   BUN 30 (*) 6 - 23 mg/dL   Creatinine, Ser 0.81  0.50 - 1.10 mg/dL   Calcium 8.7  8.4 - 10.5 mg/dL   GFR calc non Af Amer 64 (*) >90 mL/min   GFR calc Af Amer 74 (*) >90 mL/min   Comment: (NOTE)     The eGFR has been calculated using the CKD EPI equation.     This calculation has not been validated in all clinical situations.     eGFR's persistently <90 mL/min signify possible Chronic Kidney     Disease.   Anion gap 17 (*) 5 - 15   Labs are reviewed.  Current Facility-Administered Medications  Medication Dose Route Frequency Provider Last Rate Last Dose  . 0.9 %  sodium chloride infusion   Intravenous Continuous Waldemar Dickens, MD 75 mL/hr at 05/09/14 1040    . acetaminophen (TYLENOL) tablet 650 mg  650 mg Oral Q6H PRN Waldemar Dickens, MD       Or  . acetaminophen (TYLENOL) suppository 650 mg  650 mg Rectal Q6H PRN Waldemar Dickens, MD      . antiseptic oral rinse (CPC / CETYLPYRIDINIUM CHLORIDE 0.05%) solution 7 mL  7 mL Mouth Rinse q12n4p Eugenie Filler, MD   7 mL at 05/09/14 1559  . cefTRIAXone (ROCEPHIN) 1 g in dextrose 5 % 50 mL  IVPB  1 g Intravenous QHS Shea Stakes Crowford Tyler Deis., RPH   1 g at 05/09/14 2240  . chlorhexidine (PERIDEX) 0.12 % solution 15 mL  15 mL Mouth Rinse BID Eugenie Filler, MD   15 mL at 05/09/14 0914  . heparin injection 5,000 Units  5,000 Units Subcutaneous 3 times per day Waldemar Dickens, MD   5,000 Units at 05/10/14 0555  . Influenza vac split quadrivalent PF (FLUARIX) injection 0.5 mL  0.5 mL Intramuscular Tomorrow-1000 Maryann Mikhail, DO      . [START ON 05/12/2014] levothyroxine (SYNTHROID, LEVOTHROID) tablet 100 mcg  100 mcg Oral Once per day on Mon Fri Waldemar Dickens, MD      . levothyroxine (SYNTHROID, LEVOTHROID) tablet 112 mcg  112 mcg Oral Once per day on Sun Tue Wed Thu Sat Waldemar Dickens, MD      . ondansetron [facility]) tablet 4 mg  4 mg Oral Q6H PRN Waldemar Dickens, MD       Or  .  ondansetron [facility]) injection 4 mg  4 mg Intravenous Q6H PRN Waldemar Dickens, MD      . polyethylene glycol (MIRALAX / GLYCOLAX) packet 17 g  17 g Oral Daily PRN Waldemar Dickens, MD      . traMADol Veatrice Bourbon) tablet 50-100 mg  50-100 mg Oral Q6H PRN Waldemar Dickens, MD      . traZODone (DESYREL) tablet 50 mg  50 mg Oral QHS Waldemar Dickens, MD        Psychiatric Specialty Exam: Physical Exam as per history and physical  Review of Systems  Constitutional: Positive for malaise/fatigue.  Gastrointestinal: Positive for nausea and abdominal pain.  Skin: Positive for rash.  Neurological: Positive for weakness.  Psychiatric/Behavioral: The patient is nervous/anxious.     Blood pressure 141/78, pulse 95, temperature 98 F (36.7 C), temperature source Axillary, resp. rate 17, height 5' 5"  (1.651 m), weight 63.504 kg (140 lb), SpO2 91.00%.Body mass index is 23.3 kg/(m^2).  General Appearance: Casual  Eye Contact::  Good  Speech:  Clear and Coherent  Volume:  Normal  Mood:  Anxious and annoyed with queris and frustrated.  Affect:  Appropriate and Congruent  Thought Process:  Coherent and Goal Directed  Orientation:  Full (Time, Place, and Person)  Thought Content:  WDL  Suicidal Thoughts:  No  Homicidal Thoughts:  No  Memory:  Immediate;   Poor Recent;   Poor  Judgement:  Intact  Insight:  Fair  Psychomotor Activity:  Decreased  Concentration:  Fair  Recall:  Poor  Fund of Knowledge:Fair  Language: Good  Akathisia:  No  Handed:  Right  AIMS (if indicated):     Assets:  Communication Skills Desire for Improvement Financial Resources/Insurance Housing Leisure Time Resilience  Sleep:      Musculoskeletal: Strength & Muscle Tone: decreased Gait & Station: unable to stand Patient leans: N/A  Treatment Plan Summary: Daily contact with patient to assess and evaluate symptoms and progress in treatment Medication management Recommend no psych medication during this  visit.  Granvel Proudfoot,JANARDHAHA R. 05/10/2014 9:23 AM

## 2014-05-10 NOTE — Progress Notes (Signed)
Patient Active Problem List   Diagnosis Date Noted  . HOH (hard of hearing) 05/10/2014  . Dementia 05/10/2014  . Colon obstruction 05/09/2014  . Diverticulosis of sigmoid colon 05/09/2014  . Fibromyalgia   . History of GI diverticular bleed 09/24/2012  . Anemia 09/22/2012  . Shortness of breath 09/22/2012  . Normocytic anemia 07/02/2011  . Hypokalemia 07/02/2011  . Hypothyroidism 07/01/2011  . Hearing loss 07/01/2011     Pt pulled IV nearly out.  RN s at bedisde trying to help save it & give potassium.  Pt still very distended but without pain.  Small supraumbilical hernia  Not able to place nasogastric tube given difficult hiatal hernia anatomy and patient noncompliance by nursing and fluoroscopy.  Distant memory OK but insight fair.  [facility]   Gastroenterology declines attempt at colonoscopic decompression.    At this point, I am concerned her colonic obstruction is complete and the only solution is laparotomy with colon resection.  Would require colostomy since not safe to perform anastomosis with massively dilated colon and malnourished elderly female.  I wonder if it would be a good idea to consider a gastrostomy tube to help deal with hiatal hernia for presumed long ileus & the high likelihood she pulls out her NG tube.  I discussed at length with the patient the risks and benefits of surgery:  The anatomy & physiology of the digestive tract was discussed.  The pathophysiology of perforation was discussed.  Differential diagnosis such as perforated ulcer or colon, etc was discussed.   Natural history risks without surgery such as death was discussed.  I recommended abdominal exploration to diagnose & treat the source of the problem.  Laparoscopic & open techniques were discussed.   Too distended = open approach.  Risks such as bleeding, infection, abscess, leak, reoperation, bowel resection, possible ostomy, hernia, heart attack, death, and other risks were discussed.   The risks of  no intervention will lead to serious problems including death.   I expressed a good likelihood that surgery will address the problem.    Goals of post-operative recovery were discussed as well.  We will work to minimize complications although risks in an urgent setting are high.   Questions were answered.  I noted that the risks of refusing surgery is that the colon will not get better & she risks death by dehydration/duration or perforation/gangrene.  She wishes to think about things.  Her cousin is coming today from [place].  She wishes discussed with her.  Initially said she did not want any surgery.  The patient expressed understanding & wishes to "think about it for the next 3-4 days".  I cautioned her that the longer she waits the more risk of perforation or other problems.  I think it makes sense to have a palliative care consult to get a sense of goals of care and help develop a plan to figure out how aggressive this patient wants to be and should be treated.  ?DNI ?DNR ?limited code ?Full code ?Hospice.  If the patient agrees to surgery and her cousin with health care power of attorney agree, I will proceed with laparotomy later today vs. Tomorrow.  Await arrival of HCPOA cousin.  D/w pt & her nurses  Ardeth SportsmanSteven C. Onika Gudiel, M.D., F.A.C.S. Gastrointestinal and Minimally Invasive Surgery [facility] [address] [place], [place] [id number] [phone] Main / Paging

## 2014-05-10 NOTE — Clinical Documentation Improvement (Signed)
Potassium values as below.  Potassium chloride 10 mEq in 100 mL IVPB (Q1 hr x 3) ordered 10/14.  Please identify any clinical conditions associated with the abnormal potassium level in your progress note and carry over to the discharge summary.  Component Potassium  Latest Ref Rng 3.7 - 5.3 mEq/L  05/08/2014 3.9  05/09/2014 4.0  05/10/2014 3.3 (L)   Possible Clinical Conditions: -Hypokalemia -Other Condition -Unable to Determine at present  Thank you, Doy MinceVangela Jadda Hunsucker, RN [phone] Clinical Documentation Specialist

## 2014-05-10 NOTE — Progress Notes (Signed)
The patient is somewhat less confused today. She certainly knows her name, and nose that her cousin lives in [place].  The patient states that she has been having regular bowel movements. I tend to think that the statement is accurate, but it is hard to tell in this patient with some degree of cognitive dysfunction. She denies any abdominal pain, despite her significant abdominal distention.  On exam, her abdomen is basically unchanged from last night, distended, firm, and with tinkling bowel sounds.  Please see yesterday's note for my thinking with respect to this patient's case.  I have asked to speak to the patient's cousin/POA when she arrives, supposedly later today.  Overall, although I think we could attempt a decompressive colonoscopy, it would be moderately risky because of her already-distended colon; the insufflation of gas for advance the scope, in this patient who is known to have a technically difficult colon to examine, with lead to further distention and potentially perforation, especially if I am unable to get the scope past the area of presumed sigmoid stricturing for the purpose of decompression.   I discussed with the patient the concept of surgery, and she did not dismiss it out of hand as she had as an outpatient a couple of years ago in our office when she spoke with Dr. [name].  If the patient is agreeable surgery, I would strongly consider proceeding directly to surgery without antecedent colonoscopy. If antecedent colonoscopy is desired, then I would prefer to do it just prior to the surgery, in case a perforation should occur.  I would be happy to discuss this patient's case with her other physicians if desired.  Florencia Reasonsobert V. Jahzier Villalon, M.D. [phone]

## 2014-05-10 NOTE — Progress Notes (Signed)
<B((B1 tale MoorereOOdisGwinnett ADigestive Health Center Of Bedforddvanced Surgery Center LLCusteReva Everest RehabilitaRiver Hospitaltion Hospital LMidwest Specialty Surgery Ce74mter achael FeeeseRoseanne Reno0 03 [id number]<BAELoma Linda University [facility](B147 [phone] Medicine At Radnor Endoscopy FacilitRoseanne RenoA<BADTEX(B147<BADTEXTRoseanne Reno8 -[phone] Behavioral Hospital Of Spring(B147 [phone] Springso(2(B147 [phone] Endoscopy CRoseanne Ren LLC Dba DekalEPatients' Hosp(6(B147 [phone] Ridge Medical Centere Moore Center LArizonOdis Lgh [facility] Golf Astc [facility] Dba Golf Surgical CenteChristus Santa Rosa Hospital - Westover Hillsr87mteR achael FeegerRoseanne Reno<[place] Memo33mial achael FeeTEXRoseanne Reno5 [id number]>terEustace MooreC(B147Coastal Behavioral HealthJefferson Surgical Ctr At [id number] Yard  10/06/2013  Vascular/Lymphatic: Aortoiliac atherosclerotic vascular disease.  Reproductive: Uterus absent.  Ovaries not well seen.  Other: Considerable diffuse subcutaneous edema most notable in the vicinity anatomic pelvis.  Musculoskeletal: Dextroconvex thoracolumbar scoliosis with rotary component. Bony demineralization. Lumbar spondylosis and degenerative disc disease causing multilevel foraminal impingement. Grade 1 anterolisthesis at L3-4 and L4-5.  IMPRESSION: 1. Extensive and worse and dilatation of  small and large bowel with diffuse air-fluid levels. There is something of a transition point at the junction of the descending and sigmoid colon in terms of bowel caliber, and colonoscopy may be prudent to ensure that there is no actual obstruction in this vicinity, but this appearance could well be functional or due to ileus. 2. Extensive pelvic floor laxity with cystocele and low position of the anorectal junction. 3. Moderate-sized hiatal hernia. 4. Small approximately 1 cm hypodense lesion inferiorly along the pancreatic body, similar to the 2010 exam and likely benign or a very indolent. 5. Considerable third spacing of fluid in the subcutaneous tissues. Paucity of intra-abdominal adipose tissue making assessment for mesenteric edema problematic. 6. Scoliosis, spondylosis, and degenerative disc disease causing multilevel impingement.   Electronically Signed   By: Herbie BaltimoreWalt  Liebkemann M.D.   On: 05/08/2014 18:22   Dg Fluoro Rm 1-60 Min  05/09/2014   CLINICAL DATA:  Small bowel obstruction  EXAM: FLOURO RM 1-60 MIN ; UNSUCCESSFUL NASOGASTRIC TUBE PLACEMENT ATTEMPT  FLUOROSCOPY TIME:  1 min, 25 seconds  COMPARISON:  Chest radiograph 05/08/2014  FINDINGS: Multiple attempts to place the nasogastric tube were unsuccessful. The tube preferentially selects the airway, or coils in the pharynx. The patient is unable to cooperate with tube placement and became combative. The use of fluoroscopy does not help in getting the tube to select the esophagus. Endoscopic placement would likely be required to successfully select the esophagus.  IMPRESSION: 1. Unsuccessful nasogastric tube placement. The tube preferentially selects the airway or coils in the pharynx. The patient became combative and further attempts and further radiation exposure were not felt to be likely to succeed. Consider endoscopic placement.   Electronically Signed   By: Herbie BaltimoreWalt  Liebkemann M.D.   On: 05/09/2014 17:20    Medications: . antiseptic oral rinse   7 mL Mouth Rinse q12n4p  . cefTRIAXone (ROCEPHIN)  IV  1 g Intravenous QHS  . chlorhexidine  15 mL Mouth Rinse BID  . heparin  5,000 Units Subcutaneous 3 times per day  . Influenza vac split quadrivalent PF  0.5 mL Intramuscular Tomorrow-1000  . [START ON 05/12/2014] levothyroxine  100 mcg Oral Once per day on Mon Fri  . levothyroxine  112 mcg Oral Once per day on Sun Tue Wed Thu Sat  . potassium chloride  10 mEq Intravenous Q1 Hr x 3  . traZODone  50 mg Oral QHS    Assessment/Plan 1. Severe colonic obstruction  2. Hx of diverticulitis and multiple diverticular bleeds in the past.  3. Lower extremity swelling  4. Memory loss/possible dementia  5. Hx of hypothyroid  6. Hyponatremia   Plan: Dr. Donavan BurnetBuccini's note reviewed, and I will discuss with Dr. Michaell CowingGross.   Fecal occult is negative.      LOS: 2 days    Alinna Siple 05/10/2014

## 2014-05-10 NOTE — Progress Notes (Signed)
[facility]                                                                              Patient Demographics  [name], is a 80 y.o. female, DOB - [birth year], ZOX:[id number]  Admit date - 05/08/2014   Admitting Physician Hurman Horn, MD  Outpatient Primary MD for the patient is  Duane Lope, MD  LOS - 2   Chief Complaint  Patient presents with  . Leg Swelling  . Bloated  . Shortness of Breath      Interim history 80 year old with a history of small bowel obstruction, arthritis, fibromyalgia, presents emergency department for leg swelling. Patient denies any abdominal pain she does state that she's had some vomiting but then changes her mind again. She is not the best historian. I did try calling several of her friends noted on the face sheet however unable to get in touch with her healthcare power of attorney. Patient has no living family members. At this time, it appears that she may have small bowel obstruction with ileus of the small and large bowel. General surgery as well as gastroenterology have been consulted.  Assessment & Plan   Abdominal distention secondary to ileus/bowel obstruction -CT of the abdomen: Extensive and worse and dilatation of small and large bowel with diffuse air-fluid levels.  -General surgery as well as gastroenterology have been consulted and appreciated. -NG tube has not been placed the 2 on successful attempts.  -Continue n.p.o. and IV fluids.  -Dr Michaell Cowing to discussed with POA option for surgery.   Lower extremity edema -BNP 1099 -Echocardiogram in February 2014 shows an EF of 50-55% -Patient does not appear to be volume overloaded, and does not have a formal diagnosis of CHF -Possibly secondary to venous insufficiency -Patient was given one dose of Lasix 20 mg IV in the emergency department -Will continue to monitor  Urinary tract infection -continue ceftriaxone -urine culture pending  Right leg wound -Secondary to cat scratch  that occurred back in May of 2015 -Patient has been treated with antibiotics an outpatient -Wound care consulted  Undiagnosed dementia -After speaking with several friends, patient does have memory problems -Patient will need outpatient followup evaluation for her dementia  Hypothyroidism -Continue Synthroid  Hyponatremia  -Continue IVF, continue to monitor her BMP  Hypokalemia; replete with IV kcl.   Code Status: DO NOT RESUSCITATE   Family Communication: None at bedside.  Disposition Plan: Admitted   Time Spent in minutes   30  minutes  Procedures  None   Consults   Gastroenterology General surgery   DVT Prophylaxis  heparin   Lab Results  Component Value Date   PLT 405* 05/10/2014    Medications  Scheduled Meds: . antiseptic oral rinse  7 mL Mouth Rinse q12n4p  . cefTRIAXone (ROCEPHIN)  IV  1 g Intravenous QHS  . chlorhexidine  15 mL Mouth Rinse BID  . heparin  5,000 Units Subcutaneous 3 times per day  . Influenza vac split quadrivalent PF  0.5 mL Intramuscular Tomorrow-1000  . [START ON 05/12/2014] levothyroxine  100 mcg Oral Once per day on Mon Fri  . levothyroxine  112 mcg Oral Once per day  on Sun Tue Wed Thu Sat  . potassium chloride  10 mEq Intravenous Q1 Hr x 3  . sodium chloride  250 mL Intravenous Once  . traZODone  50 mg Oral QHS   Continuous Infusions: . sodium chloride 75 mL/hr at 05/10/14 0944   PRN Meds:.acetaminophen, acetaminophen, ondansetron (ZOFRAN) IV, ondansetron, polyethylene glycol, traMADol  Antibiotics    Anti-infectives   Start     Dose/Rate Route Frequency Ordered Stop   05/09/14 0215  cefTRIAXone (ROCEPHIN) 1 g in dextrose 5 % 50 mL IVPB     1 g 100 mL/hr over 30 Minutes Intravenous Daily at bedtime 05/09/14 0203          Subjective:   Shakera Wandell seen and examined today.  Patient denies abdominal pain, nausea, vomiting.   Objective:   Filed Vitals:   05/09/14 0515 05/09/14 1400 05/09/14 2106 05/10/14 0543    BP: 109/67 117/79 134/79 141/78  Pulse: 85 90 90 95  Temp: 97.7 F (36.5 C) 98.2 F (36.8 C) 97.9 F (36.6 C) 98 F (36.7 C)  TempSrc: Oral Oral Axillary Axillary  Resp: 18 18 16 17   Height:      Weight:      SpO2: 96% 98% 92% 91%    Wt Readings from Last 3 Encounters:  05/08/14 63.504 kg (140 lb)  09/22/12 66.5 kg (146 lb 9.7 oz)  07/01/11 63.5 kg (139 lb 15.9 oz)     Intake/Output Summary (Last 24 hours) at 05/10/14 1446 Last data filed at 05/10/14 1010  Gross per 24 hour  Intake    600 ml  Output    100 ml  Net    500 ml    Exam  General: Well developed, well nourished, NAD, appears stated age  HEENT: NCAT, PERRLA, EOMI, Anicteic Sclera, mucous membranes moist.   Cardiovascular: S1 S2 auscultated, no rubs, murmurs or gallops. Regular rate and rhythm.  Respiratory: Clear to auscultation bilaterally with equal chest rise  Abdomen: Distended, +BS, nontender,   Extremities: warm dry without cyanosis clubbing. + Edema in lower extremities bilaterally, small circular wound noted on the RLE  Neuro: AAOX2, cranial nerves grossly intact.   Data Review   Micro Results Recent Results (from the past 240 hour(s))  URINE CULTURE     Status: None   Collection Time    05/08/14 10:58 PM      Result Value Ref Range Status   Specimen Description URINE, RANDOM   Final   Special Requests NONE   Final   Culture  Setup Time     Final   Value: 05/09/2014 08:19     [facility]formed at [facility]   Colony Count     Final   Value: >=100,000 COLONIES/ML     [facility]   Culture     Final   Value: GRAM NEGATIVE RODS     [facility]formed at [facility]   Report Status PENDING   Incomplete    Radiology Reports Dg Chest 2 View  05/08/2014   CLINICAL DATA:  Bilateral leg edema, redness. CT bite on her left leg in may which is now a swelling  EXAM: CHEST  2 VIEW  COMPARISON:  03/22/2014  FINDINGS: Low lung volumes. Bibasilar atelectasis. Mild  cardiomegaly. Large hiatal hernia. No effusions or acute bony abnormality.  IMPRESSION: Low lung volumes with bibasilar atelectasis.  Large hiatal hernia.   Electronically Signed   By: Charlett NoseKevin  Dover M.D.   On: 05/08/2014  17:13   Ct Abdomen Pelvis W Contrast  05/08/2014   CLINICAL DATA:  Mid abdominal pain with nausea and distention for 3 days.  EXAM: CT ABDOMEN AND PELVIS WITH CONTRAST  TECHNIQUE: Multidetector CT imaging of the abdomen and pelvis was performed using the standard protocol following bolus administration of intravenous contrast.  CONTRAST:  50mL OMNIPAQUE IOHEXOL 300 MG/ML SOLN, 80mL OMNIPAQUE IOHEXOL 300 MG/ML SOLN  COMPARISON:  10/06/2013  FINDINGS: Lower chest:  Moderate-sized hiatal hernia.  Hepatobiliary: Gallbladder not well seen.  No biliary dilatation.  Pancreas: Mildly atrophic pancreas with a 1.2 by 0.7 cm hypodense lesion in the inferior aspect of the pancreatic body, image 28 series 2,, probably similar to the 2010 exam and accordingly likely benign.  Spleen: Small residual splenic tissue in the medial left upper quadrant.  Adrenals/Urinary Tract: Fullness of both adrenal glands without discrete mass. Bilateral cysts. No definite hydronephrosis or stones. The ureters are difficult to follow due to paucity of intra-abdominal bowel gas and the highly distended bowel obscuring year ureteral margins. Suspected bladder diverticulum adjacent to the urethra compatible with cystocele.  Stomach/Bowel: Abnormal low position of the anorectal junction compatible with pelvic floor laxity. The extensive dilated colon with air-fluid levels. No discrete pneumatosis. Dilated loops of small bowel with air-fluid levels noted. The colonic dilatation has worsened compared to 10/06/2013  Vascular/Lymphatic: Aortoiliac atherosclerotic vascular disease.  Reproductive: Uterus absent.  Ovaries not well seen.  Other: Considerable diffuse subcutaneous edema most notable in the vicinity anatomic pelvis.   Musculoskeletal: Dextroconvex thoracolumbar scoliosis with rotary component. Bony demineralization. Lumbar spondylosis and degenerative disc disease causing multilevel foraminal impingement. Grade 1 anterolisthesis at L3-4 and L4-5.  IMPRESSION: 1. Extensive and worse and dilatation of small and large bowel with diffuse air-fluid levels. There is something of a transition point at the junction of the descending and sigmoid colon in terms of bowel caliber, and colonoscopy may be prudent to ensure that there is no actual obstruction in this vicinity, but this appearance could well be functional or due to ileus. 2. Extensive pelvic floor laxity with cystocele and low position of the anorectal junction. 3. Moderate-sized hiatal hernia. 4. Small approximately 1 cm hypodense lesion inferiorly along the pancreatic body, similar to the 2010 exam and likely benign or a very indolent. 5. Considerable third spacing of fluid in the subcutaneous tissues. Paucity of intra-abdominal adipose tissue making assessment for mesenteric edema problematic. 6. Scoliosis, spondylosis, and degenerative disc disease causing multilevel impingement.   Electronically Signed   By: Herbie BaltimoreWalt  Liebkemann M.D.   On: 05/08/2014 18:22    CBC  Recent Labs Lab 05/08/14 1608 05/09/14 0510 05/10/14 0455  WBC 4.8 5.3 6.1  HGB 14.8 14.8 13.1  HCT 42.9 43.3 39.1  PLT 410* 415* 405*  MCV 86.5 86.6 87.9  MCH 29.8 29.6 29.4  MCHC 34.5 34.2 33.5  RDW 14.6 14.6 14.6  LYMPHSABS 0.8  --   --   MONOABS 0.6  --   --   EOSABS 0.0  --   --   BASOSABS 0.0  --   --     Chemistries   Recent Labs Lab 05/08/14 1608 05/09/14 0510 05/10/14 0455  NA 133* 132* 134*  K 3.9 4.0 3.3*  CL 91* 90* 91*  CO2 24 27 26   GLUCOSE 97 93 82  BUN 28* 29* 30*  CREATININE 0.79 0.87 0.81  CALCIUM 9.4 8.5 8.7  AST 28 27  --   ALT 18 17  --  ALKPHOS 60 58  --   BILITOT 0.7 0.7  --     ------------------------------------------------------------------------------------------------------------------ estimated creatinine clearance is 44.9 ml/min (by C-G formula based on Cr of 0.81). ------------------------------------------------------------------------------------------------------------------ No results found for this basename: HGBA1C,  in the last 72 hours ------------------------------------------------------------------------------------------------------------------ No results found for this basename: CHOL, HDL, LDLCALC, TRIG, CHOLHDL, LDLDIRECT,  in the last 72 hours ------------------------------------------------------------------------------------------------------------------ No results found for this basename: TSH, T4TOTAL, FREET3, T3FREE, THYROIDAB,  in the last 72 hours ------------------------------------------------------------------------------------------------------------------ No results found for this basename: VITAMINB12, FOLATE, FERRITIN, TIBC, IRON, RETICCTPCT,  in the last 72 hours  Coagulation profile  Recent Labs Lab 05/08/14 1608  INR 0.99    No results found for this basename: DDIMER,  in the last 72 hours  Cardiac Enzymes  Recent Labs Lab 05/08/14 1608  TROPONINI <0.30   ------------------------------------------------------------------------------------------------------------------ No components found with this basename: POCBNP,     Mattalynn Crandle A MD. on 05/10/2014 at 2:46 PM [phone]    After 7pm go to [email or web address] - password [email or web address]  And look for the night coverage person covering for me after hours  [facility] Office  [phone]

## 2014-05-10 NOTE — Progress Notes (Signed)
I discussed with gastroenterology.  Dr. Doy MinceBuccinii is concern about the risks of trying decompression.  Does not change the fact at some point she will need surgery at some point to fix this problem.  I also had a 20 minute phone conversation w/ pt's healthcare POA Arsenio Katz([name] Jones, RN, [phone]).  Both her children are in the same class at school.  I explained the concern for the complete colonic obstruction and need for some way to treat her palliate the obstruction.   I recommended exploratory laparotomy with colectomy and colostomy.  We had a long discussion about the patient's quality of life.  Patient living at home but neighbors acting as home health nurses on a daily basis.  [name] is concerned that the patient would be better cared for at an assisted living facility at some point.  Open to discussing with social work/care management/palliative care about goals of care with this patient.  Because the lady has some quality of life, we will be aggressive and do surgery.  Patient continues to wish to dictate care.  Patient wanted to wait until next week.  Now compromising to consider surgery sooner.  Will post for tomorrow knowing the patient may delay surgery until Friday.  See how she recovers and work on disposition plans.  The anatomy & physiology of the digestive tract was discussed.  The pathophysiology of the colon was discussed.  Natural history risks without surgery was discussed.   I feel the risks of no intervention will lead to serious problems that outweigh the operative risks; therefore, I recommended a partial colectomy to remove the pathology.  Minimally invasive (Robotic/Laparoscopic) & open techniques were discussed.   Risks such as bleeding, infection, abscess, leak, reoperation, possible ostomy, hernia, heart attack, death, and other risks were discussed.  I noted a good likelihood this will help address the problem.   Goals of post-operative recovery were discussed as well.    Need for adequate nutrition, daily bowel regimen and healthy physical activity, to optimize recovery was noted as well. We will work to minimize complications.  Educational materials were available as well.  Questions were answered.  The patient's HCPOA Arsenio KatzMelanie Jones expresses understanding & wishes to proceed with surgery.  Ardeth SportsmanSteven C. Tamana Hatfield, M.D., F.A.C.S. Gastrointestinal and Minimally Invasive Surgery [facility] [address] [place], [place] [address] [phone] Main / Paging

## 2014-05-10 NOTE — Progress Notes (Signed)
Just had a 15-minute phone conversation w/ pt's healthcare POA [name](Shelby Jones, RN, [phone]),--who states she has been a neighbor of the pt for the past 23 yrs).   [name]([name], the pt's cousin coming in today from [place] is apparently her FINANCIAL power of attorney).  I explained options (observation--likely to fail; colonoscopy--high risk and limited duration of benefit; and surgery).    I would favor surgery as the next step, and have reviewed Dr. Gordy SaversGross's note.  I have paged him to discuss, and will offer for him to contact Shelby.  I would be open to doing preoperative colonoscopy if desired by Dr. Michaell CowingGross.  Florencia Reasonsobert V. Jd Mccaster, M.D. [phone]

## 2014-05-11 LAB — CBC
HEMATOCRIT: 39.8 % (ref 36.0–46.0)
Hemoglobin: 13.3 g/dL (ref 12.0–15.0)
MCH: 29.6 pg (ref 26.0–34.0)
MCHC: 33.4 g/dL (ref 30.0–36.0)
MCV: 88.4 fL (ref 78.0–100.0)
Platelets: 356 10*3/uL (ref 150–400)
RBC: 4.5 MIL/uL (ref 3.87–5.11)
RDW: 14.7 % (ref 11.5–15.5)
WBC: 5.1 10*3/uL (ref 4.0–10.5)

## 2014-05-11 LAB — BASIC METABOLIC PANEL
Anion gap: 17 — ABNORMAL HIGH (ref 5–15)
BUN: 30 mg/dL — AB (ref 6–23)
CHLORIDE: 95 meq/L — AB (ref 96–112)
CO2: 24 meq/L (ref 19–32)
Calcium: 8.6 mg/dL (ref 8.4–10.5)
Creatinine, Ser: 0.79 mg/dL (ref 0.50–1.10)
GFR calc Af Amer: 85 mL/min — ABNORMAL LOW (ref >90)
GFR calc non Af Amer: 73 mL/min — ABNORMAL LOW (ref >90)
GLUCOSE: 66 mg/dL — AB (ref 70–99)
POTASSIUM: 3.5 meq/L — AB (ref 3.7–5.3)
Sodium: 136 mEq/L — ABNORMAL LOW (ref 137–147)

## 2014-05-11 NOTE — Progress Notes (Signed)
Patient was sleeping soundly when I came by on rounds, and I did not disturb her. Vital signs normal. No bowel movements through the night, per discussion with nursing staff.  Have discussed the case with Dr. Michaell CowingGross, who anticipates taking the patient to surgery either later today or tomorrow. We will not plan on preoperative colonoscopy.  I will sign off at this time. Please call if we can be of further assistance in this patient's care.  Florencia Reasonsobert V. Wilhelm Ganaway, M.D. [phone]

## 2014-05-11 NOTE — Progress Notes (Signed)
[facility] PROGRESS NOTE  [name] ZOX:[id number] DOB: 11-30-1925 DOA: 05/08/2014 PCP:  Duane Lopeoss, Alan, MD  Assessment/Plan:  Principal Problem:   Colon obstruction - GI and general surgery currently assisting with management. - Will continue pain control - GI signing off this patient is anticipated to be getting exploratory laparotomy with colectomy and colostomy - Per EMR patient has medical capacity for decision making  Active Problems:    Fibromyalgia - Stable we'll continue current medical management  Hypothyroidism -Stable continue Synthroid, most likely will have to transition to IV form after operation    HOH (hard of hearing) - Stable no new complaints    Dementia - Stable, as listed above patient has been deemed according to EMR able to have medical capacity for decision make  Urinary tract infection -Continue ceftriaxone, condition currently improving  Code Status: DO NOT RESUSCITATE Family Communication: None at bedside  Disposition Plan: Per general surgery   Consultants:  *General surgery  GI>>>05/11/14  Procedures:  Pending  Antibiotics:  Ceftriaxone  HPI/Subjective: The patient has no new complaints. Is aware of plan for exploratory laparotomy given her persistent abdominal discomfort.  Objective: Filed Vitals:   05/11/14 1400  BP: 126/75  Pulse: 98  Temp: 98 F (36.7 C)  Resp:     Intake/Output Summary (Last 24 hours) at 05/11/14 1534 Last data filed at 05/11/14 1400  Gross per 24 hour  Intake   2200 ml  Output      0 ml  Net   2200 ml   Filed Weights   05/08/14 2230  Weight: 63.504 kg (140 lb)    Exam:   General:  Patient in no acute distress, smiling at times, alert, and awake  Cardiovascular: Regular rate and rhythm, no murmurs or rubs  Respiratory: No increased work of breathing, no wheezes, no rales  Abdomen: Distended, hypoactive bowel sounds  Musculoskeletal: No cyanosis or clubbing   Data  Reviewed: Basic Metabolic Panel:  Recent Labs Lab 05/08/14 1608 05/09/14 0510 05/10/14 0455 05/11/14 0420  NA 133* 132* 134* 136*  K 3.9 4.0 3.3* 3.5*  CL 91* 90* 91* 95*  CO2 24 27 26 24   GLUCOSE 97 93 82 66*  BUN 28* 29* 30* 30*  CREATININE 0.79 0.87 0.81 0.79  CALCIUM 9.4 8.5 8.7 8.6   Liver Function Tests:  Recent Labs Lab 05/08/14 1608 05/09/14 0510  AST 28 27  ALT 18 17  ALKPHOS 60 58  BILITOT 0.7 0.7  PROT 7.0 6.4  ALBUMIN 3.5 3.3*    Recent Labs Lab 05/08/14 1608  LIPASE 60*   No results found for this basename: AMMONIA,  in the last 168 hours CBC:  Recent Labs Lab 05/08/14 1608 05/09/14 0510 05/10/14 0455 05/11/14 0420  WBC 4.8 5.3 6.1 5.1  NEUTROABS 3.3  --   --   --   HGB 14.8 14.8 13.1 13.3  HCT 42.9 43.3 39.1 39.8  MCV 86.5 86.6 87.9 88.4  PLT 410* 415* 405* 356   Cardiac Enzymes:  Recent Labs Lab 05/08/14 1608  TROPONINI <0.30   BNP (last 3 results)  Recent Labs  03/22/14 1612 03/22/14 1628 05/08/14 1615  PROBNP 901.5* 893.5* 1099.0*   CBG: No results found for this basename: GLUCAP,  in the last 168 hours  Recent Results (from the past 240 hour(s))  URINE CULTURE     Status: None   Collection Time    05/08/14 10:58 PM      Result Value Ref Range  Status   Specimen Description URINE, RANDOM   Final   Special Requests NONE   Final   Culture  Setup Time     Final   Value: 05/09/2014 08:19     Performed at [facility]   Colony Count     Final   Value: >=100,000 COLONIES/ML     Performed at [facility]   Culture     Final   Value: GRAM NEGATIVE RODS     Performed at [facility]   Report Status PENDING   Incomplete  SURGICAL PCR SCREEN     Status: None   Collection Time    05/10/14  6:42 PM      Result Value Ref Range Status   MRSA, PCR NEGATIVE  NEGATIVE Final   Staphylococcus aureus NEGATIVE  NEGATIVE Final   Comment:            The Xpert SA Assay (FDA     approved for NASAL specimens      in patients over 78 years of age),     is one component of     a comprehensive surveillance     program.  Test performance has     been validated by [facility] for patients greater     than or equal to 78 year old.     It is not intended     to diagnose infection nor to     guide or monitor treatment.     Studies: Dg Fluoro Rm 1-60 Min  05/09/2014   CLINICAL DATA:  Small bowel obstruction  EXAM: FLOURO RM 1-60 MIN ; UNSUCCESSFUL NASOGASTRIC TUBE PLACEMENT ATTEMPT  FLUOROSCOPY TIME:  1 min, 25 seconds  COMPARISON:  Chest radiograph 05/08/2014  FINDINGS: Multiple attempts to place the nasogastric tube were unsuccessful. The tube preferentially selects the airway, or coils in the pharynx. The patient is unable to cooperate with tube placement and became combative. The use of fluoroscopy does not help in getting the tube to select the esophagus. Endoscopic placement would likely be required to successfully select the esophagus.  IMPRESSION: 1. Unsuccessful nasogastric tube placement. The tube preferentially selects the airway or coils in the pharynx. The patient became combative and further attempts and further radiation exposure were not felt to be likely to succeed. Consider endoscopic placement.   Electronically Signed   By: Herbie BaltimoreWalt  Liebkemann M.D.   On: 05/09/2014 17:20    Scheduled Meds: . alvimopan  12 mg Oral On Call  . antiseptic oral rinse  7 mL Mouth Rinse q12n4p  . cefoTEtan (CEFOTAN) 2 GM IVPB  2 g Intravenous On Call to OR  . cefTRIAXone (ROCEPHIN)  IV  1 g Intravenous QHS  . chlorhexidine  15 mL Mouth Rinse BID  . heparin  5,000 Units Subcutaneous 3 times per day  . Influenza vac split quadrivalent PF  0.5 mL Intramuscular Tomorrow-1000  . [START ON 05/12/2014] levothyroxine  100 mcg Oral Once per day on Mon Fri  . levothyroxine  112 mcg Oral Once per day on Sun Tue Wed Thu Sat  . traZODone  50 mg Oral QHS   Continuous Infusions: . sodium chloride 75 mL (05/11/14  0613)    Time spent: > 35 minutes    Penny PiaVEGA, Lanie Schelling  [facility] Pager [phone] If 7PM-7AM, please contact night-coverage at [email or web address], password [email or web address] 05/11/2014, 3:34 PM  LOS: 3 days

## 2014-05-11 NOTE — Consult Note (Addendum)
WOC ostomy consult note Patient marked for possible Ileostomy, possible colostomy today per CCS request. Patient with private duty CNS present in room at time of consultation. Abdomen assessed in the sitting and lying position; she is distended and firm today as noted in previous assessments. Skin taut and dry.  Ileostomy marking is 1cm below and 5cm to the right of the umbilicus. Colostomy site is 2cm below and 6.6cm to the left of the umbilicus.  Both sites are covered with a thin film transparent dressing.  Patient is pleasantly confused today; is not certain of what I am speaking with regard to possible ostomy formation.If a stoma is created, WOC Nursing team will be happy to follow. Please reconsult at that time.  WOC wound consult note Reason for Consult: LLE ulcer (Chronic) in the presence of known venous insufficiency Wound type:traumatic Pressure Ulcer POA: No Measurement: 2.5cm x 2cm x 0.2cm Wound ZOX:WRUEbed:pale pink with scattered areas of dried serum (scabbing) Drainage (amount, consistency, odor) scant serous Periwound:intact, clear Dressing procedure/placement/frequency: I will ask Nursing staff to cover with a soft silicone foam dressing and change twice weekly to assist in the gradual reepithelializing of this area.  WOC nursing team will not follow, but will remain available to this patient, the nursing and medical team.  Please re-consult if needed. Thanks, Ladona MowLaurie Chayden Garrelts, MSN, RN, GNP, [place], CWON-AP [phone])

## 2014-05-12 ENCOUNTER — Encounter (HOSPITAL_COMMUNITY): Payer: Self-pay | Admitting: Certified Registered Nurse Anesthetist

## 2014-05-12 ENCOUNTER — Inpatient Hospital Stay (HOSPITAL_COMMUNITY): Payer: Medicare Other | Admitting: Anesthesiology

## 2014-05-12 ENCOUNTER — Encounter (HOSPITAL_COMMUNITY): Payer: Medicare Other | Admitting: Anesthesiology

## 2014-05-12 ENCOUNTER — Encounter (HOSPITAL_COMMUNITY)
Admission: EM | Disposition: A | Discharge status: Skilled Nursing Facility | Payer: Self-pay | Source: Home / Self Care | Attending: Family Medicine

## 2014-05-12 HISTORY — PX: LAPAROTOMY: SHX154

## 2014-05-12 LAB — BASIC METABOLIC PANEL
Anion gap: 24 — ABNORMAL HIGH (ref 5–15)
BUN: 19 mg/dL (ref 6–23)
CHLORIDE: 95 meq/L — AB (ref 96–112)
CO2: 19 mEq/L (ref 19–32)
Calcium: 7.6 mg/dL — ABNORMAL LOW (ref 8.4–10.5)
Creatinine, Ser: 0.71 mg/dL (ref 0.50–1.10)
GFR calc Af Amer: 88 mL/min — ABNORMAL LOW (ref >90)
GFR calc non Af Amer: 76 mL/min — ABNORMAL LOW (ref >90)
GLUCOSE: 89 mg/dL (ref 70–99)
POTASSIUM: 2.6 meq/L — AB (ref 3.7–5.3)
Sodium: 138 mEq/L (ref 137–147)

## 2014-05-12 SURGERY — LAPAROTOMY, EXPLORATORY
Anesthesia: General | Site: Abdomen

## 2014-05-12 MED ORDER — SODIUM CHLORIDE 0.9 % IJ SOLN
INTRAMUSCULAR | Status: AC
  Filled 2014-05-12: qty 3

## 2014-05-12 MED ORDER — PHENOL 1.4 % MT LIQD: 2.0000 | OROMUCOSAL | Status: DC | PRN

## 2014-05-12 MED ORDER — MAGIC MOUTHWASH
15.0000 mL | Freq: Four times a day (QID) | ORAL | Status: DC | PRN
  Administered 2014-05-19: 15 mL via ORAL
  Filled 2014-05-12: qty 15

## 2014-05-12 MED ORDER — FENTANYL CITRATE 0.05 MG/ML IJ SOLN: INTRAMUSCULAR | Status: DC | PRN

## 2014-05-12 MED ORDER — GLYCOPYRROLATE 0.2 MG/ML IJ SOLN
INTRAMUSCULAR | Status: AC
  Filled 2014-05-12: qty 3

## 2014-05-12 MED ORDER — ALVIMOPAN 12 MG PO CAPS
ORAL_CAPSULE | ORAL | Status: DC | PRN
  Administered 2014-05-12: 12 mg via ORAL

## 2014-05-12 MED ORDER — ONDANSETRON HCL 4 MG/2ML IJ SOLN: 4.0000 mg | Freq: Four times a day (QID) | INTRAMUSCULAR | Status: DC | PRN

## 2014-05-12 MED ORDER — BUPIVACAINE 0.25 % ON-Q PUMP DUAL CATH 300 ML
300.0000 mL | INJECTION | Status: DC
  Administered 2014-05-12: 300 mL
  Filled 2014-05-12: qty 300

## 2014-05-12 MED ORDER — POTASSIUM CHLORIDE 10 MEQ/100ML IV SOLN
10.0000 meq | INTRAVENOUS | Status: AC
  Administered 2014-05-12 (×3): 10 meq via INTRAVENOUS
  Filled 2014-05-12 (×3): qty 100

## 2014-05-12 MED ORDER — PROPOFOL 10 MG/ML IV BOLUS
INTRAVENOUS | Status: DC | PRN
  Administered 2014-05-12: 100 mg via INTRAVENOUS

## 2014-05-12 MED ORDER — ONDANSETRON HCL 4 MG/2ML IJ SOLN
INTRAMUSCULAR | Status: AC
  Filled 2014-05-12: qty 2

## 2014-05-12 MED ORDER — METOPROLOL TARTRATE 1 MG/ML IV SOLN
5.0000 mg | Freq: Four times a day (QID) | INTRAVENOUS | Status: DC | PRN
  Administered 2014-05-14: 5 mg via INTRAVENOUS
  Filled 2014-05-12: qty 5

## 2014-05-12 MED ORDER — ACETAMINOPHEN 10 MG/ML IV SOLN
1000.0000 mg | Freq: Once | INTRAVENOUS | Status: AC
  Administered 2014-05-12: 1000 mg via INTRAVENOUS
  Filled 2014-05-12: qty 100

## 2014-05-12 MED ORDER — MEPERIDINE HCL 50 MG/ML IJ SOLN: 6.2500 mg | INTRAMUSCULAR | Status: DC | PRN

## 2014-05-12 MED ORDER — LACTATED RINGERS IV SOLN
INTRAVENOUS | Status: DC | PRN
  Administered 2014-05-12 (×2): via INTRAVENOUS

## 2014-05-12 MED ORDER — LIDOCAINE HCL (CARDIAC) 20 MG/ML IV SOLN
INTRAVENOUS | Status: AC
  Filled 2014-05-12: qty 5

## 2014-05-12 MED ORDER — DEXAMETHASONE SODIUM PHOSPHATE 10 MG/ML IJ SOLN
INTRAMUSCULAR | Status: DC | PRN
  Administered 2014-05-12: 10 mg via INTRAVENOUS

## 2014-05-12 MED ORDER — GLYCOPYRROLATE 0.2 MG/ML IJ SOLN
INTRAMUSCULAR | Status: DC | PRN
  Administered 2014-05-12: 0.4 mg via INTRAVENOUS

## 2014-05-12 MED ORDER — PHENYLEPHRINE HCL 10 MG/ML IJ SOLN
10.0000 mg | INTRAVENOUS | Status: DC | PRN
  Administered 2014-05-12: 20 ug/min via INTRAVENOUS

## 2014-05-12 MED ORDER — LORAZEPAM 2 MG/ML IJ SOLN: 0.5000 mg | Freq: Three times a day (TID) | INTRAMUSCULAR | Status: DC | PRN

## 2014-05-12 MED ORDER — MENTHOL 3 MG MT LOZG: 1.0000 | LOZENGE | OROMUCOSAL | Status: DC | PRN

## 2014-05-12 MED ORDER — DEXTROSE 5 % IV SOLN
2.0000 g | INTRAVENOUS | Status: DC | PRN
  Administered 2014-05-12: 2 g via INTRAVENOUS

## 2014-05-12 MED ORDER — ROCURONIUM BROMIDE 100 MG/10ML IV SOLN
INTRAVENOUS | Status: DC | PRN
  Administered 2014-05-12: 20 mg via INTRAVENOUS
  Administered 2014-05-12 (×2): 5 mg via INTRAVENOUS

## 2014-05-12 MED ORDER — PROMETHAZINE HCL 25 MG/ML IJ SOLN: 6.2500 mg | INTRAMUSCULAR | Status: DC | PRN

## 2014-05-12 MED ORDER — LIP MEDEX EX OINT
1.0000 "application " | TOPICAL_OINTMENT | Freq: Two times a day (BID) | CUTANEOUS | Status: DC
  Administered 2014-05-13 – 2014-05-19 (×12): 1 via TOPICAL
  Filled 2014-05-12 (×2): qty 7

## 2014-05-12 MED ORDER — ALUM & MAG HYDROXIDE-SIMETH 200-200-20 MG/5ML PO SUSP: 30.0000 mL | Freq: Four times a day (QID) | ORAL | Status: DC | PRN

## 2014-05-12 MED ORDER — BUPIVACAINE HCL (PF) 0.25 % IJ SOLN
INTRAMUSCULAR | Status: DC | PRN
  Administered 2014-05-12: 30 mL

## 2014-05-12 MED ORDER — BUPIVACAINE HCL (PF) 0.25 % IJ SOLN
INTRAMUSCULAR | Status: AC
  Filled 2014-05-12: qty 30

## 2014-05-12 MED ORDER — ONDANSETRON HCL 4 MG/2ML IJ SOLN
INTRAMUSCULAR | Status: DC | PRN
  Administered 2014-05-12: 4 mg via INTRAVENOUS

## 2014-05-12 MED ORDER — FENTANYL CITRATE 0.05 MG/ML IJ SOLN
INTRAMUSCULAR | Status: DC | PRN
  Administered 2014-05-12 (×7): 25 ug via INTRAVENOUS

## 2014-05-12 MED ORDER — LACTATED RINGERS IV SOLN: INTRAVENOUS | Status: DC

## 2014-05-12 MED ORDER — LIDOCAINE HCL (CARDIAC) 20 MG/ML IV SOLN
INTRAVENOUS | Status: DC | PRN
  Administered 2014-05-12: 50 mg via INTRAVENOUS

## 2014-05-12 MED ORDER — PHENYLEPHRINE HCL 10 MG/ML IJ SOLN: 10.0000 ug/min | INTRAMUSCULAR | Status: DC

## 2014-05-12 MED ORDER — PROPOFOL 10 MG/ML IV BOLUS
INTRAVENOUS | Status: AC
  Filled 2014-05-12: qty 20

## 2014-05-12 MED ORDER — LACTATED RINGERS IV BOLUS (SEPSIS)
1000.0000 mL | Freq: Three times a day (TID) | INTRAVENOUS | Status: AC | PRN
  Administered 2014-05-14: 1000 mL via INTRAVENOUS

## 2014-05-12 MED ORDER — NEOSTIGMINE METHYLSULFATE 10 MG/10ML IV SOLN
INTRAVENOUS | Status: DC | PRN
  Administered 2014-05-12: 3 mg via INTRAVENOUS

## 2014-05-12 MED ORDER — DIPHENHYDRAMINE HCL 50 MG/ML IJ SOLN
12.5000 mg | Freq: Four times a day (QID) | INTRAMUSCULAR | Status: DC | PRN
  Administered 2014-05-13: 12.5 mg via INTRAVENOUS
  Administered 2014-05-15: 25 mg via INTRAVENOUS
  Filled 2014-05-12 (×2): qty 1

## 2014-05-12 MED ORDER — CEFOTETAN DISODIUM-DEXTROSE 2-2.08 GM-% IV SOLR
INTRAVENOUS | Status: AC
  Filled 2014-05-12: qty 50

## 2014-05-12 MED ORDER — FENTANYL CITRATE 0.05 MG/ML IJ SOLN: 25.0000 ug | INTRAMUSCULAR | Status: DC | PRN

## 2014-05-12 MED ORDER — TRAMADOL HCL 50 MG PO TABS: 50.0000 mg | ORAL_TABLET | Freq: Four times a day (QID) | ORAL | Status: DC | PRN

## 2014-05-12 MED ORDER — NEOSTIGMINE METHYLSULFATE 10 MG/10ML IV SOLN
INTRAVENOUS | Status: AC
  Filled 2014-05-12: qty 1

## 2014-05-12 MED ORDER — ROCURONIUM BROMIDE 100 MG/10ML IV SOLN
INTRAVENOUS | Status: AC
  Filled 2014-05-12: qty 1

## 2014-05-12 MED ORDER — ONDANSETRON 8 MG/NS 50 ML IVPB
8.0000 mg | Freq: Four times a day (QID) | INTRAVENOUS | Status: DC | PRN
  Filled 2014-05-12: qty 8

## 2014-05-12 MED ORDER — LACTATED RINGERS IV SOLN
INTRAVENOUS | Status: DC
  Administered 2014-05-13: 1000 mL via INTRAVENOUS

## 2014-05-12 MED ORDER — 0.9 % SODIUM CHLORIDE (POUR BTL) OPTIME
TOPICAL | Status: DC | PRN
  Administered 2014-05-12 (×2): 1000 mL

## 2014-05-12 MED ORDER — PHENYLEPHRINE HCL 10 MG/ML IJ SOLN
INTRAMUSCULAR | Status: AC
  Filled 2014-05-12: qty 1

## 2014-05-12 MED ORDER — SUCCINYLCHOLINE CHLORIDE 20 MG/ML IJ SOLN
INTRAMUSCULAR | Status: DC | PRN
  Administered 2014-05-12: 100 mg via INTRAVENOUS

## 2014-05-12 MED ORDER — BUPIVACAINE 0.25 % ON-Q PUMP DUAL CATH 300 ML
300.0000 mL | INJECTION | Status: DC
  Filled 2014-05-12: qty 300

## 2014-05-12 MED ORDER — FENTANYL CITRATE 0.05 MG/ML IJ SOLN
INTRAMUSCULAR | Status: AC
  Filled 2014-05-12: qty 5

## 2014-05-12 MED ORDER — HALOPERIDOL LACTATE 5 MG/ML IJ SOLN
2.0000 mg | Freq: Four times a day (QID) | INTRAMUSCULAR | Status: DC | PRN
  Administered 2014-05-15: 2 mg via INTRAVENOUS
  Administered 2014-05-16 (×2): 5 mg via INTRAVENOUS
  Filled 2014-05-12 (×4): qty 1

## 2014-05-12 MED ORDER — PHENYLEPHRINE HCL 10 MG/ML IJ SOLN
0.0000 ug/min | INTRAMUSCULAR | Status: DC
  Administered 2014-05-12: 30 ug/min via INTRAVENOUS
  Filled 2014-05-12: qty 1

## 2014-05-12 SURGICAL SUPPLY — 53 items
BLADE EXTENDED COATED 6.5IN (ELECTRODE) ×4 IMPLANT
BLADE HEX COATED 2.75 (ELECTRODE) ×4 IMPLANT
CATH KIT ON Q 7.5IN SLV (PAIN MANAGEMENT) IMPLANT
CATH KIT ON-Q SILVERSOAK 5IN (CATHETERS) ×4 IMPLANT
CATH URET ROBINSON 24FR STRL (CATHETERS) ×2 IMPLANT
COUNTER NEEDLE 20 DBL MAG RED (NEEDLE) ×2 IMPLANT
COVER MAYO STAND STRL (DRAPES) ×4 IMPLANT
DRAIN CHANNEL 19F RND (DRAIN) IMPLANT
DRAPE LAPAROSCOPIC ABDOMINAL (DRAPES) ×2 IMPLANT
DRAPE SHEET LG 3/4 BI-LAMINATE (DRAPES) ×2 IMPLANT
DRAPE UTILITY XL STRL (DRAPES) ×4 IMPLANT
DRAPE WARM FLUID 44X44 (DRAPE) ×2 IMPLANT
DRSG OPSITE POSTOP 4X10 (GAUZE/BANDAGES/DRESSINGS) ×2 IMPLANT
DRSG OPSITE POSTOP 4X6 (GAUZE/BANDAGES/DRESSINGS) IMPLANT
DRSG OPSITE POSTOP 4X8 (GAUZE/BANDAGES/DRESSINGS) IMPLANT
ELECT REM PT RETURN 9FT ADLT (ELECTROSURGICAL) ×2
ELECTRODE REM PT RTRN 9FT ADLT (ELECTROSURGICAL) ×1 IMPLANT
GAUZE SPONGE 4X4 12PLY STRL (GAUZE/BANDAGES/DRESSINGS) ×2 IMPLANT
GLOVE ECLIPSE 8.0 STRL XLNG CF (GLOVE) ×4 IMPLANT
GLOVE INDICATOR 8.0 STRL GRN (GLOVE) ×4 IMPLANT
GOWN STRL REUS W/TWL XL LVL3 (GOWN DISPOSABLE) ×8 IMPLANT
KIT BASIN OR (CUSTOM PROCEDURE TRAY) ×2 IMPLANT
LEGGING LITHOTOMY PAIR STRL (DRAPES) ×2 IMPLANT
LIGASURE IMPACT 36 18CM CVD LR (INSTRUMENTS) IMPLANT
PACK GENERAL/GYN (CUSTOM PROCEDURE TRAY) ×2 IMPLANT
PENCIL BUTTON HOLSTER BLD 10FT (ELECTRODE) ×2 IMPLANT
SEALER TISSUE X1 CVD JAW (INSTRUMENTS) ×2 IMPLANT
STAPLER CUT CVD 40MM BLUE (STAPLE) ×2 IMPLANT
STAPLER CUT RELOAD BLUE (STAPLE) ×2 IMPLANT
STAPLER VISISTAT 35W (STAPLE) ×2 IMPLANT
SUCTION POOLE TIP (SUCTIONS) ×2 IMPLANT
SUT MNCRL AB 4-0 PS2 18 (SUTURE) ×4 IMPLANT
SUT NOV 1 T60/GS (SUTURE) IMPLANT
SUT NOVA NAB DX-16 0-1 5-0 T12 (SUTURE) IMPLANT
SUT NOVA T20/GS 25 (SUTURE) IMPLANT
SUT PDS AB 1 CTX 36 (SUTURE) ×4 IMPLANT
SUT PROLENE 2 0 SH DA (SUTURE) ×4 IMPLANT
SUT SILK 2 0 (SUTURE) ×1
SUT SILK 2 0 SH CR/8 (SUTURE) ×2 IMPLANT
SUT SILK 2-0 18XBRD TIE 12 (SUTURE) ×1 IMPLANT
SUT SILK 3 0 (SUTURE) ×1
SUT SILK 3 0 SH CR/8 (SUTURE) ×4 IMPLANT
SUT SILK 3-0 18XBRD TIE 12 (SUTURE) ×1 IMPLANT
SUT VIC AB 2-0 SH 18 (SUTURE) ×2 IMPLANT
SUT VIC AB 3-0 SH 18 (SUTURE) ×8 IMPLANT
SUT VICRYL 2 0 18  UND BR (SUTURE) ×1
SUT VICRYL 2 0 18 UND BR (SUTURE) ×1 IMPLANT
TAPE UMBILICAL COTTON 1/8X30 (MISCELLANEOUS) ×2 IMPLANT
TOWEL OR 17X26 10 PK STRL BLUE (TOWEL DISPOSABLE) ×4 IMPLANT
TOWEL OR NON WOVEN STRL DISP B (DISPOSABLE) ×4 IMPLANT
TRAY FOLEY CATH 14FRSI W/METER (CATHETERS) ×2 IMPLANT
TUNNELER SHEATH ON-Q 16GX12 DP (PAIN MANAGEMENT) ×2 IMPLANT
YANKAUER SUCT BULB TIP 10FT TU (MISCELLANEOUS) ×2 IMPLANT

## 2014-05-12 NOTE — Progress Notes (Signed)
[facility] PROGRESS NOTE  Adelina Mingsellie M Lizak ZOX:[id number] DOB: 03-15-1926 DOA: 05/08/2014 PCP:  Duane Lopeoss, Alan, MD  Assessment/Plan:  Principal Problem:   Colon obstruction - Pt is s/p date 0: lysis of adhesions, sigmoid colectomy with end colostomy, serosal repair, repair of supraumbilical ventral hernia - Will continue pain control - GI signing off this patient is anticipated to be getting exploratory laparotomy with colectomy and colostomy  Active Problems:    Fibromyalgia - Stable we'll continue current medical management  Hypothyroidism -Stable continue Synthroid, most likely will have to transition to IV form after operation    HOH (hard of hearing) - Stable no new complaints    Dementia - Stable, as listed above patient has been deemed according to EMR able to have medical capacity for decision make  Urinary tract infection -Continue ceftriaxone, condition currently improving  Code Status: DO NOT RESUSCITATE Family Communication: None at bedside  Disposition Plan: Per general surgery, currently in ICU   Consultants:  *General surgery  GI>>>05/11/14  Procedures:  Pending  Antibiotics:  Ceftriaxone  HPI/Subjective: The patient recently returned from operation and currently resting. VSS per monitor  Objective: Filed Vitals:   05/12/14 1630  BP:   Pulse:   Temp: 97.4 F (36.3 C)  Resp:     Intake/Output Summary (Last 24 hours) at 05/12/14 1753 Last data filed at 05/12/14 1349  Gross per 24 hour  Intake   3205 ml  Output    650 ml  Net   2555 ml   Filed Weights   05/08/14 2230 05/12/14 1630  Weight: 63.504 kg (140 lb) 63 kg (138 lb 14.2 oz)    Exam:   General:  Pt sedated. VSS  Cardiovascular: Regular rate and rhythm  Respiratory: No audible wheezes, Pt on supplemental oxygen  Abdomen: Pt is s/p operation, deferred to surgeons  Data Reviewed: Basic Metabolic Panel:  Recent Labs Lab 05/08/14 1608 05/09/14 0510  05/10/14 0455 05/11/14 0420  NA 133* 132* 134* 136*  K 3.9 4.0 3.3* 3.5*  CL 91* 90* 91* 95*  CO2 24 27 26 24   GLUCOSE 97 93 82 66*  BUN 28* 29* 30* 30*  CREATININE 0.79 0.87 0.81 0.79  CALCIUM 9.4 8.5 8.7 8.6   Liver Function Tests:  Recent Labs Lab 05/08/14 1608 05/09/14 0510  AST 28 27  ALT 18 17  ALKPHOS 60 58  BILITOT 0.7 0.7  PROT 7.0 6.4  ALBUMIN 3.5 3.3*    Recent Labs Lab 05/08/14 1608  LIPASE 60*   No results found for this basename: AMMONIA,  in the last 168 hours CBC:  Recent Labs Lab 05/08/14 1608 05/09/14 0510 05/10/14 0455 05/11/14 0420  WBC 4.8 5.3 6.1 5.1  NEUTROABS 3.3  --   --   --   HGB 14.8 14.8 13.1 13.3  HCT 42.9 43.3 39.1 39.8  MCV 86.5 86.6 87.9 88.4  PLT 410* 415* 405* 356   Cardiac Enzymes:  Recent Labs Lab 05/08/14 1608  TROPONINI <0.30   BNP (last 3 results)  Recent Labs  03/22/14 1612 03/22/14 1628 05/08/14 1615  PROBNP 901.5* 893.5* 1099.0*   CBG: No results found for this basename: GLUCAP,  in the last 168 hours  Recent Results (from the past 240 hour(s))  URINE CULTURE     Status: None   Collection Time    05/08/14 10:58 PM      Result Value Ref Range Status   Specimen Description URINE, RANDOM   Final   Special  Requests NONE   Final   Culture  Setup Time     Final   Value: 05/09/2014 08:19     Performed at [facility]   Colony Count     Final   Value: >=100,000 COLONIES/ML     Performed at [facility]   Culture     Final   Value: GRAM NEGATIVE RODS     Performed at [facility]   Report Status PENDING   Incomplete  SURGICAL PCR SCREEN     Status: None   Collection Time    05/10/14  6:42 PM      Result Value Ref Range Status   MRSA, PCR NEGATIVE  NEGATIVE Final   Staphylococcus aureus NEGATIVE  NEGATIVE Final   Comment:            The Xpert SA Assay (FDA     approved for NASAL specimens     in patients over 78 years of age),     is one component of     a  comprehensive surveillance     program.  Test performance has     been validated by [facility] for patients greater     than or equal to 78 year old.     It is not intended     to diagnose infection nor to     guide or monitor treatment.     Studies: No results found.  Scheduled Meds: . antiseptic oral rinse  7 mL Mouth Rinse q12n4p  . cefTRIAXone (ROCEPHIN)  IV  1 g Intravenous QHS  . chlorhexidine  15 mL Mouth Rinse BID  . heparin  5,000 Units Subcutaneous 3 times per day  . Influenza vac split quadrivalent PF  0.5 mL Intramuscular Tomorrow-1000  . lip balm  1 application Topical BID  . sodium chloride      . sodium chloride       Continuous Infusions: . bupivacaine 0.25 % ON-Q pump DUAL CATH 300 mL    . lactated ringers 75 mL/hr at 05/12/14 1725  . phenylephrine (NEO-SYNEPHRINE) Adult infusion 10 mcg/min (05/12/14 1734)    Time spent: > 15 minutes    Penny PiaVEGA, Adyan Palau  [facility] Pager [phone] If 7PM-7AM, please contact night-coverage at [email or web address], password [email or web address] 05/12/2014, 5:53 PM  LOS: 4 days

## 2014-05-12 NOTE — Anesthesia Postprocedure Evaluation (Signed)
  Anesthesia Post-op Note  Patient: [name]  Procedure(s) Performed: Procedure(s) (LRB): OPEN EXPLORATORY LAPAROTOMY, SIGMOID COLECTOMY, LYSIS OF ADHESIONS, SEROSAL REPAIR, COLOSTOMY (N/A)  Patient Location: PACU  Anesthesia Type: General  Level of Consciousness: awake and alert   Airway and Oxygen Therapy: Patient Spontanous Breathing  Post-op Pain: mild  Post-op Assessment: Post-op Vital signs reviewed, Patient's Cardiovascular Status Stable, Respiratory Function Stable, Patent Airway and No signs of Nausea or vomiting  Last Vitals:  Filed Vitals:   05/12/14 1630  BP:   Pulse:   Temp: 36.3 C  Resp:     Post-op Vital Signs: stable   Complications: No apparent anesthesia complications. Step down bed. Continue neo drip

## 2014-05-12 NOTE — Progress Notes (Signed)
Patient [name]      DOB: 30-Dec-1925      ISN:[id number]  Met briefly with patient, POA Stanton Kidney, some family from [place].  [name] is on her way to OR this morning.  All present were comfortable with plan. [name] lived at home alone and they are concerned about how she will do post-op.  POA will be around between 1-230 on Monday and I will see if Dr Lovena Le can check in with her during that time. I gave her our contact information should concerns arise over weekend. I suspect goals will evolve based on how she does post-op.  Feel free to contact me over weekend if urgent questions or concerns arise.   Doran Clay D.O. Palliative Medicine Team at [facility]  Pager: [phone] Team Phone: [phone]

## 2014-05-12 NOTE — Progress Notes (Signed)
[facility]   CSW reviewed chart and noted that pt for surgery today.   CSW to follow up post operatively to complete full psychosocial assessment and discuss disposition.  CSW to continue to follow.  Loletta SpecterSuzanna Falicity Sheets, MSW, LCSW Clinical Social Work Coverage for [employer] Inc[employer]ie Haidinger, [place] [phone]

## 2014-05-12 NOTE — Anesthesia Preprocedure Evaluation (Signed)
Anesthesia Evaluation  Patient identified by MRN, date of birth, ID band Patient awake and Patient confused    Reviewed: Allergy & Precautions, H&P , NPO status , Patient's Chart, lab work & pertinent test results  Airway Mallampati: II TM Distance: >3 FB Neck ROM: Full    Dental no notable dental hx. (+) Edentulous Upper, Edentulous Lower   Pulmonary former smoker,  breath sounds clear to auscultation  Pulmonary exam normal       Cardiovascular negative cardio ROS  Rhythm:Regular Rate:Normal     Neuro/Psych negative neurological ROS  negative psych ROS   GI/Hepatic negative GI ROS, Neg liver ROS,   Endo/Other  negative endocrine ROS  Renal/GU negative Renal ROS  negative genitourinary   Musculoskeletal  (+) Fibromyalgia -  Abdominal   Peds negative pediatric ROS (+)  Hematology negative hematology ROS (+)   Anesthesia Other Findings   Reproductive/Obstetrics negative OB ROS                           Anesthesia Physical Anesthesia Plan  ASA: III  Anesthesia Plan: General   Post-op Pain Management:    Induction: Intravenous, Rapid sequence and Cricoid pressure planned  Airway Management Planned: Oral ETT  Additional Equipment:   Intra-op Plan:   Post-operative Plan: Extubation in OR  Informed Consent: I have reviewed the patients History and Physical, chart, labs and discussed the procedure including the risks, benefits and alternatives for the proposed anesthesia with the patient or authorized representative who has indicated his/her understanding and acceptance.   Dental advisory given  Plan Discussed with: CRNA  Anesthesia Plan Comments:         Anesthesia Quick Evaluation

## 2014-05-12 NOTE — Transfer of Care (Signed)
Immediate Anesthesia Transfer of Care Note  Patient: [name]  Procedure(s) Performed: Procedure(s) (LRB): OPEN EXPLORATORY LAPAROTOMY, SIGMOID COLECTOMY, LYSIS OF ADHESIONS, SEROSAL REPAIR, COLOSTOMY (N/A)  Patient Location: PACU  Anesthesia Type: General  Level of Consciousness: sedated, patient cooperative and responds to stimulation  Airway & Oxygen Therapy: Patient Spontanous Breathing and Patient connected to face mask oxgen  Post-op Assessment: Report given to PACU RN and Post -op Vital signs reviewed and stable  Post vital signs: Reviewed and stable  Complications: No apparent anesthesia complications

## 2014-05-12 NOTE — Progress Notes (Signed)
eLink Physician-Brief Progress Note Patient Name: Adelina Mingsellie M Llorens DOB: [birth year] MRN: [id number]   Date of Service  05/12/2014  HPI/Events of Note  80 [facility]  Swelling in her legs. Ongoing for a couple months but acutely worsened over the last couple of days. Associated w/ mild pain. Denies SOB, CP, Nausea, Vomiting, abd pain, fever, palpitations. Belly is at baseline very hard. the patient telephoned her friend, as she has done on many times in the past, complaining about her stomach having a "knot" in it as well as having lower extremity edema, prompting an ambulance trip to the hospital, where she was admitted.  Her admission CT showed evidence of significant dilatation of both the small bowel and large bowel, with apparent tapering and distal decompression beginning in the sigmoid region, raising the question of a possible mass or stricture in that area. Review of previous CTs from earlier this year and from a year ago show a progressive tendency toward intestinal dilatation, in the past involving just the colon, but currently involving both the small bowel and the colon.  She was suspected of having small bowel obstruction and had the following procedures: OPEN EXPLORATORY LAPAROTOMY, SIGMOID COLECTOMY, LYSIS OF ADHESIONS, SEROSAL REPAIR, COLOSTOMY (N/A)   eICU Interventions  80 yo female with SBO s\p surgical intervention  1. SBO - s\p surgical intervention - monitor UOP - aspiration precaution - NPO for now, feeding per surgical recs - pain management - consider PPI  2. Hypotension - currently on neo gtt, wean as tolerated, may give gentle fluid boluses (250cc) to wean off pressors - monitor for ACS - monitor UOP  CODE STATUS: DNR     Intervention Category Evaluation Type: New Patient Evaluation  Winifred Bodiford 05/12/2014, 5:07 PM

## 2014-05-12 NOTE — Op Note (Addendum)
05/08/2014 - 05/12/2014  1:48 PM  PATIENT:  [name]  80 y.o. female  Patient Care Team: Duane LopeAlan Ross, MD as PCP - General (Family Medicine)  PRE-OPERATIVE DIAGNOSIS:  bowel obstruction  POST-OPERATIVE DIAGNOSIS:    Colon obstruction from sigmoid colon stricture Partial small bowel obstruction Nonincarcerated ventral wall hernia supraumbilical Moderate jejunal diverticulosis without evidence of diverticulitis.  PROCEDURE:  Procedure(s): OPEN EXPLORATORY LAPAROTOMY SIGMOID COLECTOMY WITH END COLOSTOMY LYSIS OF ADHESIONS SEROSAL REPAIR REPAIR OF SUPRAUMBILICAL VENTRAL HERNIA  SURGEON:  Surgeon(s): Karie SodaSteven Chelcee Korpi, MD  ASSISTANT: RN   ANESTHESIA:   local and general  EBL:  Total I/O In: 1000 [I.V.:1000] Out: 650 [Urine:350; Blood:300]  Delay start of Pharmacological VTE agent (>24hrs) due to surgical blood loss or risk of bleeding:  no  DRAINS: none   SPECIMEN:  Source of Specimen:  SIGMOID COLON with STRICTURE  DISPOSITION OF SPECIMEN:  PATHOLOGY  COUNTS:  YES  PLAN OF CARE: Admit to inpatient   PATIENT DISPOSITION:  PACU - hemodynamically stable.  INDICATION: Elderly woman with worsening obstruction.  Complete colon obstruction.  Mass seen in sigmoid colon.  History of diverticulitis.  Hopefully that it is most likely diverticular.  Recommendation made for surgery to resect the area since felt not to be amenable to dilation or treatment by cystoscopy.  Discussed with health care power of attorney since patient has some dementia.  They agreed to proceed:  The anatomy & physiology of the digestive tract was discussed.  The pathophysiology of perforation was discussed.  Differential diagnosis such as perforated ulcer or colon, etc was discussed.   Natural history risks without surgery such as death was discussed.  I recommended abdominal exploration to diagnose & treat the source of the problem.  Laparoscopic & open techniques were discussed.   Risks such as  bleeding, infection, abscess, leak, reoperation, bowel resection, possible ostomy, hernia, heart attack, death, and other risks were discussed.   The risks of no intervention will lead to serious problems including death.   I expressed a good likelihood that surgery will address the problem.    Goals of post-operative recovery were discussed as well.  We will work to minimize complications although risks in an emergent setting are high.   Questions were answered.  The patient expressed understanding & wishes to proceed with surgery.       OR FINDINGS: Massively dilated colon to 5 cm stricture in the mid sigmoid colon.  The sigmoid colon rather shrunken and stricture consistent with probable prior sigmoid diverticulitis.  No evidence of metastatic disease.  Moderately dilated and decompressed loops of bowel with soft but dense adhesions suspicious for partial small bowel obstruction.  Small supraumbilical hernias nonincarcerated.  Repaired through incision  DESCRIPTION:   Informed consent was confirmed.   The patient received IV antibiotics and underwent general anesthesia without any difficulty. The patient was positioned supine in low lithotomy. Foley catheter was sterilely placed. SCDs were active during the entire case.  The abdomen was cprepped and draped in a sterile fashion.  A surgical timeout confirmed our plan.  I entered the abdomen through may infraumbilical midline incision.  I had to carry the incision supraumbilically through some super umbilical ventral hernias.  Also carried down close to the pubis.  Moderate omental adhesions freed off the anterior abdominal wall.  With that I could eviscerate a massively dilated colon.  Could eviscerate some small bowel of the other was a moderate amount of adhesions to the retroperitoneum.  Some interloop  adhesions.  Some moderate lesions to the sigmoid colon as well.  There is no evidence of necrosis or perforation.  I proceeded to perform lysis  lesions primarily with scissors to free the small bowel off its interloop adhesions as well as off the sigmoid mesentery especially.  Eventually I was able mobilize the left colon from the splenic flexure down to the rectosigmoid junction and a lateral to medial fashion.  With that I could elevate the obvious sigmoid stricture.  The distal sigmoid colon and rectum was very decompressed with old hard stool within it.  5 cm firm nodular area consistent with sigmoid stricture.  I went ahead and resected at the rectosigmoid junction with a contour stapler.  I placed Prolene stitches on the corners.  I took the mesentery close to the: More proximally up to the mid ascending colon.  I sure I could see the ureters and stayed away from them.  Without get some mobility.  I did have to mobilize most of the splenic flexure off of the kidney in and around the colon to get enough length for it to reach up.  I made a small colotomy and decompressed the large volume of primarily gas and some thick liquid stool to better decompress the colon.  He went down from 8-10 cm in diameter to 5 cm in diameter.  Transected at the mid descending colon and took the intervening mesentery.  I completed lysis of adhesions.  This was repaired to some thinned out areas.  I ran the small bowel numerous times.  Patient had numerous jejunal diverticuli, 20.  Involved over 4 feet of bowel starting at the ligament of Treitz.  I decided to leave them alone as that would increase morbidity for such a large resection.  I brought up the mid descending colon and through a fascial defect in the left lower quadrant paramedian region close to an area marked preoperatively by [facility].  Excised skin in a circular 3cm disc & thru rectus fascia transversely.  Patient a very thin anterior abdominal wall that was less than a centimeter thick.  Made sure I brought out 7 cm of colon.  I did place some 3-0 silk stitches on the peritoneal side with  some fascial stitches to hole the colostomy in place.  Next we changed gown and gloves per protocol proved washdown assured hemostasis.  Mild small bowel to fall back in along the mesentery to splay out much more widely as opposed to the short twisted areas of transition points from probable partial small bowel obstructions.  I brought the transverse colon and truncated but intact greater omentum to reach down to the suprapubic region to cover the area.  I placed On-Q catheters in the subxiphoid region along the costal borders prior to closing the fascia. A closed the fascia using #1 PDS in a running fashion.  I closed the skin using running 4 Monocryl stitches were some gaps to allow umbilical tape wicks.  Placed a honeycomb sterile dressing on.    Next I matured the colostomy using interrupted 3-0 Vicryl suture.  Four corner included fascial bites.  This help to [place] the colostomy well.  Easily intubated finger.  I placed a 24 French red rubber soft catheter to help flatus and liquid stool to evacuate into the colostomy appliance.  Patient had a blood pressure good urine output.  I discussed postop findings with the patient's family and healthcare power of attorney.  Questions were answered.  They  expressed understanding and appreciation.  Ardeth Sportsman, M.D., F.A.C.S. Gastrointestinal and Minimally Invasive Surgery [facility] [address] [place], [place] [address] [phone] Main / Paging

## 2014-05-13 LAB — URINE CULTURE

## 2014-05-13 LAB — POTASSIUM: POTASSIUM: 3.3 meq/L — AB (ref 3.7–5.3)

## 2014-05-13 MED ORDER — SODIUM CHLORIDE 0.9 % IV BOLUS (SEPSIS)
250.0000 mL | Freq: Once | INTRAVENOUS | Status: AC
  Administered 2014-05-13: 250 mL via INTRAVENOUS

## 2014-05-13 MED ORDER — SODIUM CHLORIDE 0.9 % IV SOLN
Freq: Once | INTRAVENOUS | Status: AC
Start: 2014-05-13 — End: 2014-05-13

## 2014-05-13 MED ORDER — SODIUM CHLORIDE 0.9 % IV SOLN
Freq: Once | INTRAVENOUS | Status: AC
  Administered 2014-05-13: 500 mL via INTRAVENOUS

## 2014-05-13 MED ORDER — POTASSIUM CHLORIDE 10 MEQ/100ML IV SOLN
10.0000 meq | INTRAVENOUS | Status: AC
  Administered 2014-05-13 (×5): 10 meq via INTRAVENOUS
  Filled 2014-05-13 (×6): qty 100

## 2014-05-13 NOTE — Progress Notes (Signed)
[facility] PROGRESS NOTE  Adelina Mingsellie M Sparacino ZOX:[id number] DOB: June 10, 1926 DOA: 05/08/2014 PCP:  Duane Lopeoss, Alan, MD  Assessment/Plan:  Principal Problem:   Colon obstruction - Pt is s/p date 1: lysis of adhesions, sigmoid colectomy with end colostomy, serosal repair, repair of supraumbilical ventral hernia - Will continue pain control - GI signing off this patient is anticipated to be getting exploratory laparotomy with colectomy and colostomy  Active Problems:    Fibromyalgia - Stable we'll continue current medical management  Hypothyroidism -Stable continue Synthroid, will continue once patient tolerating po intake.    HOH (hard of hearing) - Stable no new complaints    Dementia - Stable, as listed above patient has been deemed according to EMR able to have medical capacity for decision make.   Urinary tract infection - Continue ceftriaxone, condition currently improving. - Day 5 of 7  Code Status: DO NOT RESUSCITATE Family Communication: None at bedside  Disposition Plan: Per general surgery, currently in ICU   Consultants:  *General surgery  GI>>>05/11/14  Procedures:  Pending  Antibiotics:  Ceftriaxone  HPI/Subjective: The patient recently returned from operation and currently resting. VSS per monitor  Objective: Filed Vitals:   05/13/14 1202  BP:   Pulse:   Temp: 98.2 F (36.8 C)  Resp:     Intake/Output Summary (Last 24 hours) at 05/13/14 1552 Last data filed at 05/13/14 0700  Gross per 24 hour  Intake 1697.75 ml  Output    455 ml  Net 1242.75 ml   Filed Weights   05/08/14 2230 05/12/14 1630 05/13/14 0400  Weight: 63.504 kg (140 lb) 63 kg (138 lb 14.2 oz) 64.5 kg (142 lb 3.2 oz)    Exam:   General:  Pt sedated. VSS  Cardiovascular: Regular rate and rhythm  Respiratory: No audible wheezes, Pt on supplemental oxygen, no increased wob  Abdomen: Pt is s/p operation, deferred to surgeons  Data Reviewed: Basic Metabolic  Panel:  Recent Labs Lab 05/08/14 1608 05/09/14 0510 05/10/14 0455 05/11/14 0420 05/12/14 1806  NA 133* 132* 134* 136* 138  K 3.9 4.0 3.3* 3.5* 2.6*  CL 91* 90* 91* 95* 95*  CO2 24 27 26 24 19   GLUCOSE 97 93 82 66* 89  BUN 28* 29* 30* 30* 19  CREATININE 0.79 0.87 0.81 0.79 0.71  CALCIUM 9.4 8.5 8.7 8.6 7.6*   Liver Function Tests:  Recent Labs Lab 05/08/14 1608 05/09/14 0510  AST 28 27  ALT 18 17  ALKPHOS 60 58  BILITOT 0.7 0.7  PROT 7.0 6.4  ALBUMIN 3.5 3.3*    Recent Labs Lab 05/08/14 1608  LIPASE 60*   No results found for this basename: AMMONIA,  in the last 168 hours CBC:  Recent Labs Lab 05/08/14 1608 05/09/14 0510 05/10/14 0455 05/11/14 0420  WBC 4.8 5.3 6.1 5.1  NEUTROABS 3.3  --   --   --   HGB 14.8 14.8 13.1 13.3  HCT 42.9 43.3 39.1 39.8  MCV 86.5 86.6 87.9 88.4  PLT 410* 415* 405* 356   Cardiac Enzymes:  Recent Labs Lab 05/08/14 1608  TROPONINI <0.30   BNP (last 3 results)  Recent Labs  03/22/14 1612 03/22/14 1628 05/08/14 1615  PROBNP 901.5* 893.5* 1099.0*   CBG: No results found for this basename: GLUCAP,  in the last 168 hours  Recent Results (from the past 240 hour(s))  URINE CULTURE     Status: None   Collection Time    05/08/14 10:58 PM  Result Value Ref Range Status   Specimen Description URINE, RANDOM   Final   Special Requests NONE   Final   Culture  Setup Time     Final   Value: 05/09/2014 08:19     Performed at [facility]   Colony Count     Final   Value: >=100,000 COLONIES/ML     Performed at [facility]   Culture     Final   Value: PROTEUS MIRABILIS     Performed at [facility]   Report Status 05/13/2014 FINAL   Final   Organism ID, Bacteria PROTEUS MIRABILIS   Final  SURGICAL PCR SCREEN     Status: None   Collection Time    05/10/14  6:42 PM      Result Value Ref Range Status   MRSA, PCR NEGATIVE  NEGATIVE Final   Staphylococcus aureus NEGATIVE  NEGATIVE Final    Comment:            The Xpert SA Assay (FDA     approved for NASAL specimens     in patients over 78 years of age),     is one component of     a comprehensive surveillance     program.  Test performance has     been validated by [facility] for patients greater     than or equal to 78 year old.     It is not intended     to diagnose infection nor to     guide or monitor treatment.     Studies: No results found.  Scheduled Meds: . antiseptic oral rinse  7 mL Mouth Rinse q12n4p  . cefTRIAXone (ROCEPHIN)  IV  1 g Intravenous QHS  . chlorhexidine  15 mL Mouth Rinse BID  . heparin  5,000 Units Subcutaneous 3 times per day  . Influenza vac split quadrivalent PF  0.5 mL Intramuscular Tomorrow-1000  . lip balm  1 application Topical BID   Continuous Infusions: . bupivacaine 0.25 % ON-Q pump DUAL CATH 300 mL    . lactated ringers 1,000 mL (05/13/14 0851)  . phenylephrine (NEO-SYNEPHRINE) Adult infusion Stopped (05/12/14 1836)    Time spent: > 20 minutes    Penny PiaVEGA, Torry Istre  [facility] Pager [phone] If 7PM-7AM, please contact night-coverage at [email or web address], password [email or web address] 05/13/2014, 3:52 PM  LOS: 5 days

## 2014-05-13 NOTE — Evaluation (Signed)
Physical Therapy Evaluation Patient Details Name: Adelina Mingsellie M Newey MRN: [id number] DOB: 1926-01-02 Today's Date: 05/13/2014   History of Present Illness  80 yo female admitted 05/08/14 withStricture of sigmoid colon with complete obstruction ,s/p colectomy/colostomy 05/12/2014  Clinical Impression  Patient more focussed on getting water, only allowed ice chips. Patient  will benefit from PT to address problems listed in note below.     Follow Up Recommendations SNF;Supervision/Assistance - 24 hour    Equipment Recommendations       Recommendations for Other Services OT consult     Precautions / Restrictions Precautions Precautions: Fall Precaution Comments: abd incision, On Q pump very short line Required Braces or Orthoses: Other Brace/Splint Other Brace/Splint: abd. binder      Mobility  Bed Mobility Overal bed mobility: Needs Assistance;+ 2 for safety/equipment Bed Mobility: Supine to Sit;Rolling;Sidelying to Sit Rolling: Mod assist   Supine to sit: Mod assist;+2 for safety/equipment;HOB elevated     General bed mobility comments: multimodal cues for rolling to protect abdominal  incision, difficult for pt to hear and understand activity  Transfers Overall transfer level: Needs assistance Equipment used: Rolling walker (2 wheeled) Transfers: Sit to/from BJ'sStand;Stand Pivot Transfers Sit to Stand: +2 safety/equipment;+2 physical assistance Stand pivot transfers: Mod assist;+2 safety/equipment;+2 physical assistance       General transfer comment: cues for use of RW , extra time  due to hearing issue. able to take several steps to recliner.  Ambulation/Gait                Stairs            Wheelchair Mobility    Modified Rankin (Stroke Patients Only)       Balance Overall balance assessment: Needs assistance Sitting-balance support: Feet supported;No upper extremity supported Sitting balance-Leahy Scale: Fair     Standing balance support:  During functional activity;Bilateral upper extremity supported Standing balance-Leahy Scale: Fair                               Pertinent Vitals/Pain Pain Assessment: No/denies pain Sats >90% on 30% VM,     Home Living Family/patient expects to be discharged to:: Skilled nursing facility Living Arrangements: Alone   Type of Home: House         Home Equipment: Dan HumphreysWalker - 2 wheels;Cane - single point      Prior Function Level of Independence: Independent with assistive device(s)               Hand Dominance        Extremity/Trunk Assessment   Upper Extremity Assessment: Generalized weakness           Lower Extremity Assessment: Generalized weakness      Cervical / Trunk Assessment: Kyphotic  Communication   Communication: HOH  Cognition Arousal/Alertness: Awake/alert Behavior During Therapy: Restless (wants a cup of water) Overall Cognitive Status: Difficult to assess (very HOH, knew [facility], [place],)                      General Comments      Exercises        Assessment/Plan    PT Assessment Patient needs continued PT services  PT Diagnosis Difficulty walking;Generalized weakness   PT Problem List Decreased strength;Decreased activity tolerance;Decreased mobility;Decreased balance;Decreased knowledge of precautions;Decreased safety awareness;Decreased knowledge of use of DME  PT Treatment Interventions DME instruction;Gait training;Functional mobility training;Therapeutic activities;Therapeutic exercise;Patient/family education  PT Goals (Current goals can be found in the Care Plan section) Acute Rehab PT Goals Patient Stated Goal: just want water. PT Goal Formulation: Patient unable to participate in goal setting Time For Goal Achievement: 05/27/14 Potential to Achieve Goals: Good    Frequency Min 3X/week   Barriers to discharge        Co-evaluation               End of Session   Activity Tolerance:  Patient limited by fatigue Patient left: in chair;with call bell/phone within reach;with chair alarm set Nurse Communication: Mobility status         Time: [id number] PT Time Calculation (min): 25 min   Charges:   PT Evaluation $Initial PT Evaluation Tier I: 1 Procedure PT Treatments $Therapeutic Activity: 23-37 mins   PT G Codes:          Rada HayHill, Zaim Nitta Elizabeth 05/13/2014, 3:34 PM Blanchard KelchKaren Luverne Zerkle PT [phone]

## 2014-05-13 NOTE — Progress Notes (Signed)
1 Day Post-Op  Subjective: Not having much pain.  Thirsty.  Objective: Vital signs in last 24 hours: Temp:  [95.7 F (35.4 C)-98.3 F (36.8 C)] 97.7 F (36.5 C) (10/17 0822) Pulse Rate:  [39-83] 83 (10/17 0600) Resp:  [10-23] 12 (10/17 0700) BP: (55-118)/(17-73) 108/59 mmHg (10/17 0700) SpO2:  [84 %-100 %] 96 % (10/17 0600) FiO2 (%):  [30 %-35 %] 30 % (10/17 0600) Weight:  [138 lb 14.2 oz (63 kg)-142 lb 3.2 oz (64.5 kg)] 142 lb 3.2 oz (64.5 kg) (10/17 0400)    Intake/Output from previous day: 10/16 0701 - 10/17 0700 In: 3697.8 [I.V.:2867.8; NG/GT:240; IV Piggyback:350] Out: 1105 [Urine:805; Blood:300] Intake/Output this shift:    PE: General- In NAD Abdomen-soft; left sided colostomy edematous, viable; some thin bloody drainage from colostomy; some thin, bloody drainage from midline wound  Lab Results:   Recent Labs  05/11/14 0420  WBC 5.1  HGB 13.3  HCT 39.8  PLT 356   BMET  Recent Labs  05/11/14 0420 05/12/14 1806  NA 136* 138  K 3.5* 2.6*  CL 95* 95*  CO2 24 19  GLUCOSE 66* 89  BUN 30* 19  CREATININE 0.79 0.71  CALCIUM 8.6 7.6*   PT/INR No results found for this basename: LABPROT, INR,  in the last 72 hours Comprehensive Metabolic Panel:    Component Value Date/Time   NA 138 05/12/2014 1806   NA 136* 05/11/2014 0420   K 2.6* 05/12/2014 1806   K 3.5* 05/11/2014 0420   CL 95* 05/12/2014 1806   CL 95* 05/11/2014 0420   CO2 19 05/12/2014 1806   CO2 24 05/11/2014 0420   BUN 19 05/12/2014 1806   BUN 30* 05/11/2014 0420   CREATININE 0.71 05/12/2014 1806   CREATININE 0.79 05/11/2014 0420   GLUCOSE 89 05/12/2014 1806   GLUCOSE 66* 05/11/2014 0420   CALCIUM 7.6* 05/12/2014 1806   CALCIUM 8.6 05/11/2014 0420   AST 27 05/09/2014 0510   AST 28 05/08/2014 1608   ALT 17 05/09/2014 0510   ALT 18 05/08/2014 1608   ALKPHOS 58 05/09/2014 0510   ALKPHOS 60 05/08/2014 1608   BILITOT 0.7 05/09/2014 0510   BILITOT 0.7 05/08/2014 1608   PROT 6.4  05/09/2014 0510   PROT 7.0 05/08/2014 1608   ALBUMIN 3.3* 05/09/2014 0510   ALBUMIN 3.5 05/08/2014 1608     Studies/Results: No results found.  Anti-infectives: Anti-infectives   Start     Dose/Rate Route Frequency Ordered Stop   05/11/14 0600  cefoTEtan (CEFOTAN) 2 g in dextrose 5 % 50 mL IVPB  Status:  Discontinued     2 g 100 mL/hr over 30 Minutes Intravenous On call to O.R. 05/10/14 1512 05/12/14 1637   05/09/14 0215  cefTRIAXone (ROCEPHIN) 1 g in dextrose 5 % 50 mL IVPB     1 g 100 mL/hr over 30 Minutes Intravenous Daily at bedtime 05/09/14 0203        Assessment Principal Problem:   Stricture of sigmoid colon with complete obstruction s/p colectomy/colostomy 05/12/2014 Active Problems:   Oliguria   Hypokalemia   Diverticulosis of sigmoid colon   Fibromyalgia   HOH (hard of hearing)   Dementia    LOS: 5 days   Plan: Fluid bolus.  Replace potassium.  WOC consult.  OOB.   Samreet Edenfield J 05/13/2014

## 2014-05-14 ENCOUNTER — Inpatient Hospital Stay (HOSPITAL_COMMUNITY): Payer: Medicare Other

## 2014-05-14 DIAGNOSIS — R579 Shock, unspecified: Secondary | ICD-10-CM | POA: Diagnosis present

## 2014-05-14 LAB — BASIC METABOLIC PANEL
ANION GAP: 15 (ref 5–15)
BUN: 23 mg/dL (ref 6–23)
CHLORIDE: 99 meq/L (ref 96–112)
CO2: 23 mEq/L (ref 19–32)
CREATININE: 0.95 mg/dL (ref 0.50–1.10)
Calcium: 7.8 mg/dL — ABNORMAL LOW (ref 8.4–10.5)
GFR calc non Af Amer: 53 mL/min — ABNORMAL LOW (ref >90)
GFR, EST AFRICAN AMERICAN: 61 mL/min — AB (ref >90)
Glucose, Bld: 114 mg/dL — ABNORMAL HIGH (ref 70–99)
POTASSIUM: 3.1 meq/L — AB (ref 3.7–5.3)
Sodium: 137 mEq/L (ref 137–147)

## 2014-05-14 LAB — TSH: TSH: 3.92 u[IU]/mL (ref 0.350–4.500)

## 2014-05-14 LAB — CBC
HEMATOCRIT: 27.4 % — AB (ref 36.0–46.0)
Hemoglobin: 9.4 g/dL — ABNORMAL LOW (ref 12.0–15.0)
MCH: 30.3 pg (ref 26.0–34.0)
MCHC: 34.3 g/dL (ref 30.0–36.0)
MCV: 88.4 fL (ref 78.0–100.0)
Platelets: 229 10*3/uL (ref 150–400)
RBC: 3.1 MIL/uL — ABNORMAL LOW (ref 3.87–5.11)
RDW: 15.3 % (ref 11.5–15.5)
WBC: 16.2 10*3/uL — AB (ref 4.0–10.5)

## 2014-05-14 LAB — LACTIC ACID, PLASMA: Lactic Acid, Venous: 1.1 mmol/L (ref 0.5–2.2)

## 2014-05-14 LAB — CORTISOL: CORTISOL PLASMA: 71.3 ug/dL

## 2014-05-14 LAB — MAGNESIUM: MAGNESIUM: 1.5 mg/dL (ref 1.5–2.5)

## 2014-05-14 LAB — PHOSPHORUS: Phosphorus: 2.8 mg/dL (ref 2.3–4.6)

## 2014-05-14 MED ORDER — VANCOMYCIN HCL IN DEXTROSE 1-5 GM/200ML-% IV SOLN
1000.0000 mg | Freq: Once | INTRAVENOUS | Status: AC
  Administered 2014-05-14: 1000 mg via INTRAVENOUS
  Filled 2014-05-14: qty 200

## 2014-05-14 MED ORDER — MAGNESIUM SULFATE 40 MG/ML IJ SOLN
2.0000 g | Freq: Once | INTRAMUSCULAR | Status: AC
  Administered 2014-05-14: 2 g via INTRAVENOUS
  Filled 2014-05-14: qty 50

## 2014-05-14 MED ORDER — SODIUM CHLORIDE 0.9 % IV SOLN
INTRAVENOUS | Status: DC
  Administered 2014-05-14: 1000 mL via INTRAVENOUS
  Administered 2014-05-14 – 2014-05-17 (×4): via INTRAVENOUS

## 2014-05-14 MED ORDER — AMIODARONE HCL IN DEXTROSE 360-4.14 MG/200ML-% IV SOLN
60.0000 mg/h | INTRAVENOUS | Status: AC
  Administered 2014-05-14 (×2): 60 mg/h via INTRAVENOUS
  Filled 2014-05-14: qty 200

## 2014-05-14 MED ORDER — LACTATED RINGERS IV BOLUS (SEPSIS): 1000.0000 mL | Freq: Once | INTRAVENOUS | Status: DC

## 2014-05-14 MED ORDER — AMIODARONE HCL IN DEXTROSE 360-4.14 MG/200ML-% IV SOLN
30.0000 mg/h | INTRAVENOUS | Status: DC
  Filled 2014-05-14: qty 200

## 2014-05-14 MED ORDER — POTASSIUM CHLORIDE 10 MEQ/100ML IV SOLN
10.0000 meq | INTRAVENOUS | Status: AC
  Administered 2014-05-14 (×4): 10 meq via INTRAVENOUS
  Filled 2014-05-14 (×4): qty 100

## 2014-05-14 MED ORDER — VANCOMYCIN HCL 500 MG IV SOLR
500.0000 mg | Freq: Two times a day (BID) | INTRAVENOUS | Status: DC
  Administered 2014-05-15 – 2014-05-17 (×5): 500 mg via INTRAVENOUS
  Filled 2014-05-14 (×8): qty 500

## 2014-05-14 MED ORDER — SODIUM CHLORIDE 0.9 % IV BOLUS (SEPSIS)
250.0000 mL | Freq: Once | INTRAVENOUS | Status: AC
  Administered 2014-05-14: 250 mL via INTRAVENOUS

## 2014-05-14 MED ORDER — SODIUM CHLORIDE 0.9 % IV BOLUS (SEPSIS)
500.0000 mL | Freq: Once | INTRAVENOUS | Status: AC
  Administered 2014-05-14: 500 mL via INTRAVENOUS

## 2014-05-14 MED ORDER — PHENYLEPHRINE HCL 10 MG/ML IJ SOLN
30.0000 ug/min | INTRAVENOUS | Status: DC
  Administered 2014-05-14 (×2): 30 ug/min via INTRAVENOUS
  Filled 2014-05-14 (×3): qty 1

## 2014-05-14 MED ORDER — PIPERACILLIN-TAZOBACTAM 3.375 G IVPB
3.3750 g | Freq: Three times a day (TID) | INTRAVENOUS | Status: DC
  Administered 2014-05-14 – 2014-05-18 (×13): 3.375 g via INTRAVENOUS
  Filled 2014-05-14 (×12): qty 50

## 2014-05-14 MED ORDER — AMIODARONE LOAD VIA INFUSION
150.0000 mg | Freq: Once | INTRAVENOUS | Status: AC
  Administered 2014-05-14: 150 mg via INTRAVENOUS
  Filled 2014-05-14: qty 83.34

## 2014-05-14 NOTE — Progress Notes (Addendum)
Clinical Social Work Department CLINICAL SOCIAL WORK PLACEMENT NOTE 05/14/2014  Patient:  [name],[name]  Account Number:  [id number] Admit date:  05/08/2014  Clinical Social Worker:  Orpah GreekKELLY FOLEY, LCSWA  Date/time:  05/14/2014 03:33 PM  Clinical Social Work is seeking post-discharge placement for this patient at the following level of care:   SKILLED NURSING   (*CSW will update this form in Epic as items are completed)   05/14/2014  [facility]/family provided with [facility] of Clinical Social Work's list of facilities offering this level of care within the geographic area requested by the patient (or if unable, by the patient's family).  05/14/2014  Patient/family informed of their freedom to choose among providers that offer the needed level of care, that participate in Medicare, Medicaid or managed care program needed by the patient, have an available bed and are willing to accept the patient.  05/14/2014  Patient/family informed of [facility]' ownership interest in [facility], as well as of the fact that they are under no obligation to receive care at this facility.  PASARR submitted to EDS on 05/14/2014 PASARR number received on 05/14/2014  FL2 transmitted to all facilities in geographic area requested by pt/family on  05/14/2014 FL2 transmitted to all facilities within larger geographic area on   Patient informed that his/her managed care company has contracts with or will negotiate with  certain facilities, including the following:     Patient/family informed of bed offers received:  05/15/2014 Patient chooses bed at  Physician recommends and patient chooses bed at    Patient to be transferred to  on 05/19/2014  Patient to be transferred to facility by [facility]AR-Valor Quaintance Patrick-Jefferson, LCSWA Patient and family notified of transfer on 05/19/2014  Name of family member notified:  Arsenio KatzMelanie Jones [facility])  The following physician request were entered in  Epic:   Additional Comments:   Lincoln MaxinKelly Harrison, LCSW [facility] Clinical Social Worker cell #: [phone]

## 2014-05-14 NOTE — Consult Note (Signed)
PULMONARY / CRITICAL CARE MEDICINE   Name: [name] MRN: [id number] DOB: [birth year]  LOS 6 days PCP  Duane Lopeoss, Alan, MD    ADMISSION DATE:  05/08/2014 CONSULTATION DATE:  05/14/2014   REFERRING MD :  Dr Penny Piarlando Vega of [facility]  CHIEF COMPLAINT:  Circulator shock  INITIAL PRESENTATION: see hpi  STUDIES /EVENT 05/08/2014 - admit 05/12/14 - Stricture of sigmoid colon with complete obstruction s/p colectomy/colostomy  05/14/2014 - PCCM consult for hypotension     HISTORY OF PRESENT ILLNESS:  80 year old frrail female with dementia, HOH, fraility NOS. Presented with chronic worsening of lower extremity edeem and mild abdominal pain on 05/08/2014 and diagnosed with chronic colonic obstruction. Treated medically /. Conservatively initially but underwent p-lap and foind to have sigmoid colon stricture and s/p sigmoid colectory with end colostomy, lysis of adhension, serosal repair and repair of supra-umbiclical hernia. In the immediate post op period 05/12/14 she was hypotensive and responded to fluid. Then on 05/14/2014 earl hours found to be in A Fib RVR with hypotension and Rx with amio gtt. By PM 10/19\8/15 was back in NSR but had persistent hypotension and PCCM consulted for hypotensin co-management  Patient is DNAR per Alroy DustMelanie Johnson DPOA    PAST MEDICAL HISTORY :   has a past medical history of Arthritis; Fibromyalgia; Diverticulosis; Hypothyroidism; Osteoarthritis; DJD (degenerative joint disease); Diverticulitis (07/01/2011); and Diverticulosis of sigmoid colon (05/09/2014).  has past surgical history that includes Ectopic pregnancy surgery; Abdominal hysterectomy; Abdominal adhesion surgery; Knee Arthroplasty; and Colonoscopy (2011).   has past surgical history that includes Ectopic pregnancy surgery; Abdominal hysterectomy; Abdominal adhesion surgery; Knee Arthroplasty; and Colonoscopy (2011).   Prior to Admission medications   Medication Sig Start Date End Date Taking?  Authorizing Provider  levothyroxine (SYNTHROID, LEVOTHROID) 100 MCG tablet Take 100 mcg by mouth. Take 100 mcg on Monday and Friday.  Take 112 mcg all other days.   Yes Historical Provider, MD  levothyroxine (SYNTHROID, LEVOTHROID) 112 MCG tablet Take 112 mcg by mouth. Take 112 mcg on Tuesday, Wednesday, Thursday, Saturday and Sunday.Take 100 mcg on other days.   Yes Historical Provider, MD  loperamide (IMODIUM A-D) 2 MG tablet Take 2 mg by mouth 4 (four) times daily as needed for diarrhea or loose stools.   Yes Historical Provider, MD  nabumetone (RELAFEN) 750 MG tablet Take 750 mg by mouth as needed for mild pain (as directed.).    Yes Historical Provider, MD  polyethylene glycol (MIRALAX / GLYCOLAX) packet Take 17 g by mouth daily as needed for mild constipation.    Yes Historical Provider, MD  traMADol (ULTRAM) 50 MG tablet Take 50-100 mg by mouth. Maximum dose= 8 tablets per day   Yes Historical Provider, MD  traMADol (ULTRAM) 50 MG tablet Take 1-2 tablets (50-100 mg total) by mouth every 6 (six) hours as needed for moderate pain. 05/12/14   [name], MD   Allergies  Allergen Reactions  . Antihistamines, Chlorpheniramine-Type Other (See Comments)    headache  . Codeine     headache    FAMILY HISTORY:  indicated that her mother is deceased. She indicated that her father is deceased.  SOCIAL HISTORY:  reports that she quit smoking about 29 years ago. Her smoking use included Cigarettes. She smoked 0.00 packs per day. She has never used smokeless tobacco. She reports that she drinks alcohol. She reports that she does not use illicit drugs.    SUBJECTIVE:   VITAL SIGNS: Temp:  [97.6 F (36.4 C)-100.2  F (37.9 C)] 98.1 F (36.7 C) (10/18 1200) Pulse Rate:  [64-136] 67 (10/18 1230) Resp:  [13-24] 17 (10/18 1230) BP: (72-120)/(40-91) 80/43 mmHg (10/18 1230) SpO2:  [83 %-99 %] 99 % (10/18 1230) FiO2 (%):  [35 %] 35 % (10/18 0800) Weight:  [66.3 kg (146 lb 2.6 oz)] 66.3 kg (146 lb  2.6 oz) (10/18 0400) HEMODYNAMICS:   VENTILATOR SETTINGS: Vent Mode:  [-]  FiO2 (%):  [35 %] 35 % INTAKE / OUTPUT:  Intake/Output Summary (Last 24 hours) at 05/14/14 1449 Last data filed at 05/14/14 1407  Gross per 24 hour  Intake   5735 ml  Output    620 ml  Net   5115 ml    PHYSICAL EXAMINATION: General:  Frail female. In bed Neuro:  Not too responsive HEENT:  Face Mask o2 on Cardiovascular:  Normal heart sounds. Sinus. No murmurs Lungs:  Not in respiratory distress. CTA bilaterals Abdomen:  Soft, no mass. Musculoskeletal:  No cyanosis. No clubbing No edema Skin:  Frail  LABS: PULMONARY No results found for this basename: PHART, PCO2, PCO2ART, PO2, PO2ART, HCO3, TCO2, O2SAT,  in the last 168 hours  CBC  Recent Labs Lab 05/10/14 0455 05/11/14 0420 05/14/14 1010  HGB 13.1 13.3 9.4*  HCT 39.1 39.8 27.4*  WBC 6.1 5.1 16.2*  PLT 405* 356 229    COAGULATION  Recent Labs Lab 05/08/14 1608  INR 0.99    CARDIAC   Recent Labs Lab 05/08/14 1608  TROPONINI <0.30    Recent Labs Lab 05/08/14 1615  PROBNP 1099.0*     CHEMISTRY  Recent Labs Lab 05/09/14 0510 05/10/14 0455 05/11/14 0420 05/12/14 1806 05/13/14 2029 05/14/14 0705  NA 132* 134* 136* 138  --  137  K 4.0 3.3* 3.5* 2.6* 3.3* 3.1*  CL 90* 91* 95* 95*  --  99  CO2 27 26 24 19   --  23  GLUCOSE 93 82 66* 89  --  114*  BUN 29* 30* 30* 19  --  23  CREATININE 0.87 0.81 0.79 0.71  --  0.95  CALCIUM 8.5 8.7 8.6 7.6*  --  7.8*  MG  --   --   --   --   --  1.5  PHOS  --   --   --   --   --  2.8   Estimated Creatinine Clearance: 38 ml/min (by C-G formula based on Cr of 0.95).   LIVER  Recent Labs Lab 05/08/14 1608 05/09/14 0510  AST 28 27  ALT 18 17  ALKPHOS 60 58  BILITOT 0.7 0.7  PROT 7.0 6.4  ALBUMIN 3.5 3.3*  INR 0.99  --      INFECTIOUS  Recent Labs Lab 05/14/14 1010  LATICACIDVEN 1.1     ENDOCRINE CBG (last 3)  No results found for this basename: GLUCAP,  in  the last 72 hours       IMAGING x48h No results found.     ASSESSMENT / PLAN:  PULMONARY OETT - she is DNI A: acute hypoxemic resp failure but with normal wob P:   Face mask o2 Focus on concurrent comfort  CARDIOVASCULAR CVL - too frail and high risk A: Circulatory shock following a fib P:  Fluid bolus Neo via piv Cycle enzymes per triad  RENAL A:  Mild hypomag and low k P:   Replete mag (k repletion done by triad)  GASTROINTESTINAL A:  S/p p lap 05/12/14 P:   Per ccs  HEMATOLOGIC\ A:  Anemia of post op and critical illness P:  - PRBC for hgb </= 6.9gm%    - exceptions are   -  if ACS susepcted/confirmed then transfuse for hgb </= 8.0gm%,  or    -  If septic shock first 24h and scvo2 < 70% then transfuse for hgb </= 9.0gm%   - active bleeding with hemodynamic instability, then transfuse regardless of hemoglobin value   At at all times try to transfuse 1 unit prbc as possible with exception of active hemorrhage    INFECTIOUS A:  peritonitis P:   Anti-infectives   Start     Dose/Rate Route Frequency Ordered Stop   05/11/14 0600  cefoTEtan (CEFOTAN) 2 g in dextrose 5 % 50 mL IVPB  Status:  Discontinued     2 g 100 mL/hr over 30 Minutes Intravenous On call to O.R. 05/10/14 1512 05/12/14 1637   05/09/14 0215  cefTRIAXone (ROCEPHIN) 1 g in dextrose 5 % 50 mL IVPB     1 g 100 mL/hr over 30 Minutes Intravenous Daily at bedtime 05/09/14 0203         ENDOCRINE  A:  At risk for   Sugar fluctuations P:   Monitor per triad  NEUROLOGIC A:  somnolent P:   monitor    CODE STATUS    Code Status Orders        Start     Ordered   05/08/14 2148  Do not attempt resuscitation (DNR)   Continuous    Question Answer Comment  In the event of cardiac or respiratory ARREST Do not call a "code blue"   In the event of cardiac or respiratory ARREST Do not perform Intubation, CPR, defibrillation or ACLS   In the event of cardiac or respiratory ARREST Use  medication by any route, position, wound care, and other measures to relive pain and suffering. May use oxygen, suction and manual treatment of airway obstruction as needed for comfort.      05/08/14 2148      FAMILY Updates: DPOA [name] expressed continued medical care but if actively dying natural death preferred Interdisciplinary Family Meeting: N/a    The patient is critically ill with multiple organ systems failure and requires high complexity decision making for assessment and support, frequent evaluation and titration of therapies, application of advanced monitoring technologies and extensive interpretation of multiple databases.   Critical Care Time devoted to patient care services described in this note is  30  Minutes. This time reflects time of care of this signee Dr Kalman ShanMurali Tomoki Lucken. This critical care time does not reflect procedure time, or teaching time or supervisory time of PA/NP/Med student/Med Resident etc but could involve care discussion time    Dr. Kalman ShanMurali Lasheika Ortloff, M.D., [facility]P Pulmonary and Critical Care Medicine Staff Physician [facility] Pager: [phone], If no answer or between  15:00h - 7:00h: call [phone]  05/14/2014 3:36 PM

## 2014-05-14 NOTE — Plan of Care (Addendum)
Paged by RN about pt w/ sustained Afib with RVR, HR in 120s, and SBP in 60s.  RN spoke with surgeon who gave order for LR bolus. Note reviewed and labs checked- pt is 2 days post op colectomy/colostomy, with new onset Afib, and with UTI (on Rocephin).  Tachycardia differential includes hypovolemia or post op blood loss.  Multiple stat labs ordered.  Upon examination, pt is lying comfortably in bed, responded in an agitated fashion, and didn't like to be awakened. Rechecked SBP 75 and HR in 150s.   LR changed to NS at 150/hr, and started amiodarone bolus and infusion. Defer initiation of anticoagulation at this time, since post op.  [name], [facility]

## 2014-05-14 NOTE — Progress Notes (Signed)
2 Days Post-Op  Subjective: Had rapid afib last night.  Now on an Amiodarone drip.  No significant abdominal pain.  Objective: Vital signs in last 24 hours: Temp:  [97.6 F (36.4 C)-100.2 F (37.9 C)] 100.2 F (37.9 C) (10/18 0400) Pulse Rate:  [66-136] 80 (10/18 0800) Resp:  [11-23] 19 (10/18 0800) BP: (76-120)/(46-87) 91/46 mmHg (10/18 0800) SpO2:  [83 %-100 %] 97 % (10/18 0800) Weight:  [146 lb 2.6 oz (66.3 kg)] 146 lb 2.6 oz (66.3 kg) (10/18 0400)    Intake/Output from previous day: 10/17 0701 - 10/18 0700 In: 5010 [I.V.:2150; IV Piggyback:2800] Out: 410 [Urine:335; Stool:75] Intake/Output this shift:    PE: General- In NAD.  Bilious ng output Abdomen-soft; left sided colostomy edematous, viable; some thin bloody drainage from colostomy; midline wound is clean and intact  Lab Results:  No results found for this basename: WBC, HGB, HCT, PLT,  in the last 72 hours BMET  Recent Labs  05/12/14 1806 05/13/14 2029 05/14/14 0705  NA 138  --  137  K 2.6* 3.3* 3.1*  CL 95*  --  99  CO2 19  --  23  GLUCOSE 89  --  114*  BUN 19  --  23  CREATININE 0.71  --  0.95  CALCIUM 7.6*  --  7.8*   PT/INR No results found for this basename: LABPROT, INR,  in the last 72 hours Comprehensive Metabolic Panel:    Component Value Date/Time   NA 137 05/14/2014 0705   NA 138 05/12/2014 1806   K 3.1* 05/14/2014 0705   K 3.3* 05/13/2014 2029   CL 99 05/14/2014 0705   CL 95* 05/12/2014 1806   CO2 23 05/14/2014 0705   CO2 19 05/12/2014 1806   BUN 23 05/14/2014 0705   BUN 19 05/12/2014 1806   CREATININE 0.95 05/14/2014 0705   CREATININE 0.71 05/12/2014 1806   GLUCOSE 114* 05/14/2014 0705   GLUCOSE 89 05/12/2014 1806   CALCIUM 7.8* 05/14/2014 0705   CALCIUM 7.6* 05/12/2014 1806   AST 27 05/09/2014 0510   AST 28 05/08/2014 1608   ALT 17 05/09/2014 0510   ALT 18 05/08/2014 1608   ALKPHOS 58 05/09/2014 0510   ALKPHOS 60 05/08/2014 1608   BILITOT 0.7 05/09/2014 0510   BILITOT  0.7 05/08/2014 1608   PROT 6.4 05/09/2014 0510   PROT 7.0 05/08/2014 1608   ALBUMIN 3.3* 05/09/2014 0510   ALBUMIN 3.5 05/08/2014 1608     Studies/Results: No results found.  Anti-infectives: Anti-infectives   Start     Dose/Rate Route Frequency Ordered Stop   05/11/14 0600  cefoTEtan (CEFOTAN) 2 g in dextrose 5 % 50 mL IVPB  Status:  Discontinued     2 g 100 mL/hr over 30 Minutes Intravenous On call to O.R. 05/10/14 1512 05/12/14 1637   05/09/14 0215  cefTRIAXone (ROCEPHIN) 1 g in dextrose 5 % 50 mL IVPB     1 g 100 mL/hr over 30 Minutes Intravenous Daily at bedtime 05/09/14 0203        Assessment Principal Problem:   Stricture of sigmoid colon with complete obstruction s/p colectomy/colostomy 05/12/2014 Active Problems:   Oliguria-uop picking up this morning.   Hypokalemia-improved but not normal   Diverticulosis of sigmoid colon   Fibromyalgia   HOH (hard of hearing)   Dementia    LOS: 6 days   Plan: More KCL runs.  Check CBC.  Wait for return of bowel function.   Olene Godfrey J 05/14/2014

## 2014-05-14 NOTE — Progress Notes (Signed)
Clinical Social Work Department BRIEF PSYCHOSOCIAL ASSESSMENT 05/14/2014  Patient:  [name],[name]     Account Number:  [id number]     Admit date:  05/08/2014  Clinical Social Worker:  Orpah GreekFOLEY,Janece Laidlaw, LCSWA  Date/Time:  05/14/2014 03:26 PM  Referred by:  Physician  Date Referred:  05/14/2014 Referred for  SNF Placement   Other Referral:   Interview type:  Patient Other interview type:   and neighbor/HCPOA, Shelby & friend/cargiver, [name] at bedside    PSYCHOSOCIAL DATA Living Status:  ALONE Admitted from facility:   Level of care:   Primary support name:  [name] (neighbor/HCPOA) c#: [phone] Primary support relationship to patient:  NEIGHBOR Degree of support available:   good    CURRENT CONCERNS Current Concerns  Post-Acute Placement   Other Concerns:    SOCIAL WORK ASSESSMENT / PLAN CSW reviewed PT evaluation recommending SNF at discharge.   Assessment/plan status:  [facility] Other assessment/ plan:   Information/referral to community resources:   CSW completed FL[facility] - provided patient's HCPOA, [name] list of SNFs.    PATIENT'S/FAMILY'S RESPONSE TO PLAN OF CARE: Patient's HCPOA, Shelby informed CSW that she has known patient for 23 years and patient also has a Surveyor, quantityinancial POA/cousin, [name] who lives [place] who does not always have to patient's best interest in mind. Patient's HCPOA & friend understand the need for patient to go to SNF at discharge. CSW reviewed Dr. Valora CorporalJonnalagadda's note on 10/14 indicating that patient does have capacity to make her own decisions. Per HCPOA, patient will most likely agree to SNF for short-term rehab only. CSW will follow-up with bed offers when ready.       Lincoln MaxinKelly Gailene Youkhana, LCSW [facility] Clinical Social Worker cell #: [phone]

## 2014-05-14 NOTE — Progress Notes (Signed)
At 0140, pt heart rate increased 120-160s afib and  UOP for 4 hours <120cc.  BP soft.  Lopressor 5 mg and Lactated Ringers given X 1 per PRN order.  Dr. Johna SheriffHoxworth notified and orders received for one more bolus of LR and call the triad hospitalist regarding the patient's heart rate.

## 2014-05-14 NOTE — Consult Note (Signed)
Dear Doctor: [name], This patient has been identified as a candidate for PICC for the following reason (s): poor veins/poor circulatory system (CHF, COPD, emphysema, diabetes, steroid use, IV drug abuse, etc.) If you agree, please write an order for the indicated device. For any questions contact the Vascular Access Team at [phone] if no answer, please leave a message.  Thank you for supporting the early vascular access assessment program.

## 2014-05-14 NOTE — Progress Notes (Addendum)
[facility] PROGRESS NOTE  MANILLA STRIETER ZOX:[id number] DOB: Jan 05, 1926 DOA: 05/08/2014 PCP:  Duane Lope, MD  Assessment/Plan:  Principal Problem:   Hypotension: new problem - The patient has had multiple fluid boluses. This occurred while patient developed atrial fibrillation. - Atrial fibrillation resolved currently - Last reported blood pressure at 80/43, will administer fluid bolus NS 250 cc and continue normal saline at 150cc/hr - Will discuss case with PCCM for further assistance and evaluation  Atrial fibrillation: New problem - Resolved on amiodarone gtt - Currently in sinus. Will d/c amiodarone - Pt still hypotensive - Will check potassium, phosphorus, magnesium. - Complicated in that patient can not take any medications po due to recent surgical intervention  Hypokalemia - Will replace K levels - Check Magnesium levels - Agree with potassium replacement IV  Colon obstruction - Pt is s/p date 2: lysis of adhesions, sigmoid colectomy with end colostomy, serosal repair, repair of supraumbilical ventral hernia - Will continue pain control - GI signing off this patient is anticipated to be getting exploratory laparotomy with colectomy and colostomy  Active Problems:    Fibromyalgia - Stable we'll continue current medical management  Hypothyroidism -Stable continue Synthroid, will continue once patient tolerating po intake.    HOH (hard of hearing) - Stable no new complaints    Dementia - Continue to monitor, no need for restraints at this juncture  Urinary tract infection - Continue ceftriaxone, condition currently improving. - Day 6 of 7  Code Status: DO NOT RESUSCITATE Family Communication: None at bedside  Disposition Plan: Per general surgery, currently in ICU   Consultants:  *General surgery  GI>>>05/11/14  Procedures:  Pending  Antibiotics:  Ceftriaxone  HPI/Subjective: The patient states that she has been sleepy but otherwise no  other complaints.  Objective: Filed Vitals:   05/14/14 1230  BP: 80/43  Pulse: 67  Temp:   Resp: 17    Intake/Output Summary (Last 24 hours) at 05/14/14 1313 Last data filed at 05/14/14 1235  Gross per 24 hour  Intake   5310 ml  Output    545 ml  Net   4765 ml   Filed Weights   05/12/14 1630 05/13/14 0400 05/14/14 0400  Weight: 63 kg (138 lb 14.2 oz) 64.5 kg (142 lb 3.2 oz) 66.3 kg (146 lb 2.6 oz)    Exam:   General:  Pt resting but arrousable  Cardiovascular: Addendum: S1 and S2 present with no murmurs on auscultation  Respiratory: No audible wheezes, Pt on supplemental oxygen, no increased wob  Abdomen: incision intact with no active bleeding  Data Reviewed: Basic Metabolic Panel:  Recent Labs Lab 05/09/14 0510 05/10/14 0455 05/11/14 0420 05/12/14 1806 05/13/14 2029 05/14/14 0705  NA 132* 134* 136* 138  --  137  K 4.0 3.3* 3.5* 2.6* 3.3* 3.1*  CL 90* 91* 95* 95*  --  99  CO2 27 26 24 19   --  23  GLUCOSE 93 82 66* 89  --  114*  BUN 29* 30* 30* 19  --  23  CREATININE 0.87 0.81 0.79 0.71  --  0.95  CALCIUM 8.5 8.7 8.6 7.6*  --  7.8*   Liver Function Tests:  Recent Labs Lab 05/08/14 1608 05/09/14 0510  AST 28 27  ALT 18 17  ALKPHOS 60 58  BILITOT 0.7 0.7  PROT 7.0 6.4  ALBUMIN 3.5 3.3*    Recent Labs Lab 05/08/14 1608  LIPASE 60*   No results found for this basename: AMMONIA,  in the last 168 hours CBC:  Recent Labs Lab 05/08/14 1608 05/09/14 0510 05/10/14 0455 05/11/14 0420 05/14/14 1010  WBC 4.8 5.3 6.1 5.1 16.2*  NEUTROABS 3.3  --   --   --   --   HGB 14.8 14.8 13.1 13.3 9.4*  HCT 42.9 43.3 39.1 39.8 27.4*  MCV 86.5 86.6 87.9 88.4 88.4  PLT 410* 415* 405* 356 229   Cardiac Enzymes:  Recent Labs Lab 05/08/14 1608  TROPONINI <0.30   BNP (last 3 results)  Recent Labs  03/22/14 1612 03/22/14 1628 05/08/14 1615  PROBNP 901.5* 893.5* 1099.0*   CBG: No results found for this basename: GLUCAP,  in the last 168  hours  Recent Results (from the past 240 hour(s))  URINE CULTURE     Status: None   Collection Time    05/08/14 10:58 PM      Result Value Ref Range Status   Specimen Description URINE, RANDOM   Final   Special Requests NONE   Final   Culture  Setup Time     Final   Value: 05/09/2014 08:19     Performed at [facility]   Colony Count     Final   Value: >=100,000 COLONIES/ML     Performed at [facility]   Culture     Final   Value: PROTEUS MIRABILIS     Performed at [facility]   Report Status 05/13/2014 FINAL   Final   Organism ID, Bacteria PROTEUS MIRABILIS   Final  SURGICAL PCR SCREEN     Status: None   Collection Time    05/10/14  6:42 PM      Result Value Ref Range Status   MRSA, PCR NEGATIVE  NEGATIVE Final   Staphylococcus aureus NEGATIVE  NEGATIVE Final   Comment:            The Xpert SA Assay (FDA     approved for NASAL specimens     in patients over 10521 years of age),     is one component of     a comprehensive surveillance     program.  Test performance has     been validated by [facility] for patients greater     than or equal to 78 year old.     It is not intended     to diagnose infection nor to     guide or monitor treatment.     Studies: No results found.  Scheduled Meds: . antiseptic oral rinse  7 mL Mouth Rinse q12n4p  . cefTRIAXone (ROCEPHIN)  IV  1 g Intravenous QHS  . chlorhexidine  15 mL Mouth Rinse BID  . heparin  5,000 Units Subcutaneous 3 times per day  . Influenza vac split quadrivalent PF  0.5 mL Intramuscular Tomorrow-1000  . lactated ringers  1,000 mL Intravenous Once  . lip balm  1 application Topical BID  . potassium chloride  10 mEq Intravenous Q1 Hr x 4   Continuous Infusions: . sodium chloride 1,000 mL (05/14/14 1110)  . bupivacaine 0.25 % ON-Q pump DUAL CATH 300 mL    . phenylephrine (NEO-SYNEPHRINE) Adult infusion Stopped (05/12/14 1836)    Time spent: > 35 minutes     Penny PiaVEGA,  Omarri Eich  [facility] Pager [phone] If 7PM-7AM, please contact night-coverage at [email or web address], password [email or web address] 05/14/2014, 1:13 PM  LOS: 6 days   Addendum: Given source of infection and elevated leukocytosis we'll broaden antibiotic  coverage and add vancomycin and Zosyn. The concern is for sepsis.

## 2014-05-14 NOTE — Progress Notes (Signed)
ANTIBIOTIC CONSULT NOTE - INITIAL  Pharmacy Consult for Vancomycin and Zosyn Indication: Sepsis/peritonitis  Allergies  Allergen Reactions  . Antihistamines, Chlorpheniramine-Type Other (See Comments)    headache  . Codeine     headache    Patient Measurements: Height: 5\' 2"  (157.5 cm) Weight: 146 lb 2.6 oz (66.3 kg) IBW/kg (Calculated) : 50.1   Vital Signs: Temp: 98.1 F (36.7 C) (10/18 1200) Temp Source: Axillary (10/18 1200) BP: 111/59 mmHg (10/18 1500) Pulse Rate: 73 (10/18 1500) Intake/Output from previous day: 10/17 0701 - 10/18 0700 In: 5043.3 [I.V.:2183.3; IV Piggyback:2800] Out: 410 [Urine:335; Stool:75] Intake/Output from this shift: Total I/O In: 2128.4 [I.V.:1228.4; IV Piggyback:900] Out: 225 [Urine:225]  Labs:  Recent Labs  05/12/14 1806 05/14/14 0705 05/14/14 1010  WBC  --   --  16.2*  HGB  --   --  9.4*  PLT  --   --  229  CREATININE 0.71 0.95  --    Estimated Creatinine Clearance: 38 ml/min (by C-G formula based on Cr of 0.95). No results found for this basename: Rolm GalaVANCOTROUGH, VANCOPEAK, VANCORANDOM, GENTTROUGH, GENTPEAK, GENTRANDOM, TOBRATROUGH, TOBRAPEAK, TOBRARND, AMIKACINPEAK, AMIKACINTROU, AMIKACIN,  in the last 72 hours   Microbiology: Recent Results (from the past 720 hour(s))  URINE CULTURE     Status: None   Collection Time    05/08/14 10:58 PM      Result Value Ref Range Status   Specimen Description URINE, RANDOM   Final   Special Requests NONE   Final   Culture  Setup Time     Final   Value: 05/09/2014 08:19     Performed at [facility]   Colony Count     Final   Value: >=100,000 COLONIES/ML     Performed at [facility]   Culture     Final   Value: PROTEUS MIRABILIS     Performed at [facility]   Report Status 05/13/2014 FINAL   Final   Organism ID, Bacteria PROTEUS MIRABILIS   Final  SURGICAL PCR SCREEN     Status: None   Collection Time    05/10/14  6:42 PM      Result Value Ref Range Status    MRSA, PCR NEGATIVE  NEGATIVE Final   Staphylococcus aureus NEGATIVE  NEGATIVE Final   Comment:            The Xpert SA Assay (FDA     approved for NASAL specimens     in patients over 78 years of age),     is one component of     a comprehensive surveillance     program.  Test performance has     been validated by [facility] for patients greater     than or equal to 78 year old.     It is not intended     to diagnose infection nor to     guide or monitor treatment.    Medical History: Past Medical History  Diagnosis Date  . Arthritis   . Fibromyalgia   . Diverticulosis   . Hypothyroidism   . Osteoarthritis   . DJD (degenerative joint disease)   . Diverticulitis 07/01/2011  . Diverticulosis of sigmoid colon 05/09/2014    Medications:  Anti-infectives   Start     Dose/Rate Route Frequency Ordered Stop   05/11/14 0600  cefoTEtan (CEFOTAN) 2 g in dextrose 5 % 50 mL IVPB  Status:  Discontinued  2 g 100 mL/hr over 30 Minutes Intravenous On call to O.R. 05/10/14 1512 05/12/14 1637   05/09/14 0215  cefTRIAXone (ROCEPHIN) 1 g in dextrose 5 % 50 mL IVPB  Status:  Discontinued     1 g 100 mL/hr over 30 Minutes Intravenous Daily at bedtime 05/09/14 0203 05/14/14 1536     Assessment: 80yo F admitted 10/12 with worsening LE edema and chronic colonic obstruction. Rocephin started for possible UTI. 10/16 underwent sigmoid colectomy, end colostomy, LOA, serosal repair, and repair of supra-umbilical hernia. 10/18 developed A.fib w/ RVR and hypotension. Converted to NSR on amiodarone drip but hypotension is persistent. Pharmacy is asked to dose Zosyn and Vanc for possible sepsis/peritonitis.   10/13 >> ceftriaxone >> 10/18 10/18 >> Zosyn >>  10/18 >> Vanc >>  Tmax: 100.2 WBCs: increasing Renal: SCr wnl, CrCl 38CG, 48N  10/12 urine: P. mirabilis - susc to all except R to NTF 10/18 blood x2: pending  Goal of Therapy:  Vancomycin trough level 15-20 mcg/ml Appropriate  antibiotic dosing for renal function; eradication of infection  Plan:   Vancomycin 1g x 1 then 500mg  IV q12h.  Zosyn 3.375g IV Q8H infused over 4hrs.  Measure Vanc trough at steady state.  Follow up renal fxn and culture results.  Charolotte Ekeom Kydan Shanholtzer, PharmD, pager [phone]. 05/14/2014,4:00 PM.

## 2014-05-14 NOTE — Progress Notes (Signed)
Assess Patient for PICC line insertion.  Rt. Arm noted to be swollen.  Rt. Brachial and Basilic veins are too small.  PICC would occupy over 100% of both veins.  In Lt. Arm PICC would occupy over 100% of of brachial vein and 88% of Basilic vein (needs to be under 50% occupancy0.   Unable to locate either arm cephalic veins.  These measurements  May be due to low blood pressures over the last couple of hours.   If PICC is still desired over central line can reassess in am if pressures have improved.

## 2014-05-15 ENCOUNTER — Encounter (HOSPITAL_COMMUNITY): Payer: Self-pay | Admitting: Surgery

## 2014-05-15 ENCOUNTER — Inpatient Hospital Stay (HOSPITAL_COMMUNITY): Payer: Medicare Other

## 2014-05-15 DIAGNOSIS — Z515 Encounter for palliative care: Secondary | ICD-10-CM

## 2014-05-15 DIAGNOSIS — F0391 Unspecified dementia with behavioral disturbance: Secondary | ICD-10-CM

## 2014-05-15 DIAGNOSIS — J9601 Acute respiratory failure with hypoxia: Secondary | ICD-10-CM

## 2014-05-15 LAB — BASIC METABOLIC PANEL
Anion gap: 11 (ref 5–15)
BUN: 16 mg/dL (ref 6–23)
CALCIUM: 7.5 mg/dL — AB (ref 8.4–10.5)
CO2: 26 mEq/L (ref 19–32)
CREATININE: 0.62 mg/dL (ref 0.50–1.10)
Chloride: 100 mEq/L (ref 96–112)
GFR calc Af Amer: >90 mL/min (ref >90)
GFR calc non Af Amer: 79 mL/min — ABNORMAL LOW (ref >90)
GLUCOSE: 116 mg/dL — AB (ref 70–99)
POTASSIUM: 3.4 meq/L — AB (ref 3.7–5.3)
Sodium: 137 mEq/L (ref 137–147)

## 2014-05-15 LAB — CBC
HCT: 29.6 % — ABNORMAL LOW (ref 36.0–46.0)
Hemoglobin: 10.1 g/dL — ABNORMAL LOW (ref 12.0–15.0)
MCH: 29.9 pg (ref 26.0–34.0)
MCHC: 34.1 g/dL (ref 30.0–36.0)
MCV: 87.6 fL (ref 78.0–100.0)
Platelets: 235 10*3/uL (ref 150–400)
RBC: 3.38 MIL/uL — ABNORMAL LOW (ref 3.87–5.11)
RDW: 15.3 % (ref 11.5–15.5)
WBC: 15.7 10*3/uL — ABNORMAL HIGH (ref 4.0–10.5)

## 2014-05-15 MED ORDER — POTASSIUM CHLORIDE 10 MEQ/100ML IV SOLN
10.0000 meq | INTRAVENOUS | Status: AC
  Administered 2014-05-15 – 2014-05-16 (×2): 10 meq via INTRAVENOUS
  Filled 2014-05-15: qty 100

## 2014-05-15 MED ORDER — METOPROLOL TARTRATE 1 MG/ML IV SOLN
5.0000 mg | Freq: Once | INTRAVENOUS | Status: AC
  Administered 2014-05-15: 5 mg via INTRAVENOUS
  Filled 2014-05-15: qty 5

## 2014-05-15 MED ORDER — LEVOTHYROXINE SODIUM 100 MCG IV SOLR
60.0000 ug | Freq: Every day | INTRAVENOUS | Status: DC
  Administered 2014-05-15 – 2014-05-16 (×2): 60 ug via INTRAVENOUS
  Filled 2014-05-15 (×2): qty 5

## 2014-05-15 MED ORDER — POTASSIUM CHLORIDE 10 MEQ/100ML IV SOLN
10.0000 meq | INTRAVENOUS | Status: AC
  Administered 2014-05-15 (×2): 10 meq via INTRAVENOUS
  Filled 2014-05-15 (×2): qty 100

## 2014-05-15 MED ORDER — CETYLPYRIDINIUM CHLORIDE 0.05 % MT LIQD
7.0000 mL | Freq: Two times a day (BID) | OROMUCOSAL | Status: DC
  Administered 2014-05-15 – 2014-05-18 (×6): 7 mL via OROMUCOSAL

## 2014-05-15 MED ORDER — MAGNESIUM SULFATE IN D5W 10-5 MG/ML-% IV SOLN: 1.0000 g | Freq: Once | INTRAVENOUS | Status: DC

## 2014-05-15 MED ORDER — DIPHENHYDRAMINE HCL 50 MG/ML IJ SOLN: 12.5000 mg | Freq: Four times a day (QID) | INTRAMUSCULAR | Status: DC | PRN

## 2014-05-15 NOTE — Progress Notes (Signed)
Clinical Social Work  Psych CSW evaluated patient and psych MD determined that patient has capacity to make her own decisions. Psych CSW is signing off but unit CSW will continue to work with patient for DC needs.  [name], Kentucky[place]CS[place] [phone]

## 2014-05-15 NOTE — Progress Notes (Signed)
CSW continuing to follow for disposition planning.  CSW met with pt at bedside to discuss SNF bed offers. Pt hard of hearing, but CSW was able to discuss with pt that CSW was providing pt with options for places for rehab at [facility]. Pt stated that the doctor says she is not yet ready for discharge, but pt expressed that she is ready to go somewhere as soon as possible. CSW expressed understanding and provided support and discussed that CSW wanted pt to have options for SNF for rehab in order for pt to review options to be able to make decision. Pt provided permission for CSW to contact pt HCPOA, Deetta Perla to provide SNF options.   CSW contacted pt HCPOA, Deetta Perla via telephone. CSW left voice message for pt HCPOA. CSW to await return phone call.   CSW to continue to follow to provide support and assist with disposition planning.  [name], MSW, [place] [facility] [phone]

## 2014-05-15 NOTE — Progress Notes (Signed)
Physical Therapy Treatment Patient Details Name: [name] MRN: [id number] DOB: Sep 03, 1925 Today's Date: 05/15/2014    History of Present Illness 80 yo female admitted 05/08/14 withStricture of sigmoid colon with complete obstruction ,s/p colectomy/colostomy 05/12/2014    PT Comments    Pt with limited progress due to medical issues today;  RN and WOC RN present  with pt d/t leaking colostomy;  Follow Up Recommendations  SNF;Supervision/Assistance - 24 hour     Equipment Recommendations  None recommended by PT    Recommendations for Other Services       Precautions / Restrictions Precautions Precautions: Fall Precaution Comments: Monitor HR; abd incision, On Q pump very short line Required Braces or Orthoses: Other Brace/Splint Other Brace/Splint: abd. binder (however, no binder in room. per nursing, soiled but stated ok to get pt up) Restrictions Weight Bearing Restrictions: No    Mobility  Bed Mobility Overal bed mobility: Needs Assistance;+ 2 for safety/equipment Bed Mobility: Supine to Sit;Sit to Supine Rolling: Max assist Sidelying to sit: +2 for safety/equipment;Mod assist Supine to sit: +2 for physical assistance;+2 for safety/equipment;Mod assist Sit to supine: +2 for physical assistance;Max assist   General bed mobility comments: multimodal cues for technique, does only partial roll,  pt very HOH and having difficulty understanding; requires hand over hand and incr visual cues to participate  Transfers Overall transfer level: Needs assistance Equipment used: Rolling walker (2 wheeled) Transfers: Sit to/from Stand Sit to Stand: +2 physical assistance;Mod assist Stand pivot transfers: +2 physical assistance;Max assist       General transfer comment: multimodal cues for technique, difficulty d/t hearing deficits; had to defer transfer d/t colostomy leaking  Ambulation/Gait                 Stairs            Wheelchair Mobility     Modified Rankin (Stroke Patients Only)       Balance                                    Cognition Arousal/Alertness: Awake/alert Behavior During Therapy: Restless Overall Cognitive Status: Difficult to assess                      Exercises      General Comments        Pertinent Vitals/Pain Pain Assessment: No/denies pain    Home Living Family/patient expects to be discharged to:: Skilled nursing facility Living Arrangements: Alone           Home Equipment: Walker - 2 wheels;Cane - single point      Prior Function Level of Independence: Independent with assistive device(s)          PT Goals (current goals can now be found in the care plan section) Acute Rehab PT Goals Patient Stated Goal: wanted to comb hair PT Goal Formulation: Patient unable to participate in goal setting Time For Goal Achievement: 05/27/14 Potential to Achieve Goals: Good Progress towards PT goals: Progressing toward goals    Frequency  Min 3X/week    PT Plan Current plan remains appropriate    Co-evaluation             End of Session   Activity Tolerance: Other (comment) (limited d/t colostomy leaking) Patient left: in bed;with call bell/phone within reach;with nursing/sitter in room     Time: 1035-1056 (and [id number]) PT Time Calculation (  min): 21 min  Charges:  $Therapeutic Activity: 23-37 mins                    G Codes:      Gurinder Toral 05/15/2014, 1:26 PM

## 2014-05-15 NOTE — Consult Note (Signed)
Patient [name]:[name]      DOB: 08-07-1925      VWU:[id number]     Consult Note from the Palliative Medicine Team at [facility]    Consult Requested by: Dr. Rudene ChristiansVega et al.     PCP:  Duane Lopeoss, Alan, MD Reason for Consultation GOC    Phone [phone] Related symptom recommendations Assessment of patients Current state: 80 yr old white female with past medical history for diverticular disease with history for GI bleeds in the past.  Medical POA is the patients' neighbor [name]. Shawna OrleansMelanie able to tell me that [name] has not really been addressing her medical needs of late (stopped going to take care of a chronic leg wound etc). [name] has had some memory problems but has forged forward nonetheless driving and living alone with significant support from a number of friends.  Shelina evidently has a two glass of scotch per night habit last drink was 10/8 .  Patient has been noted to possibly get lost when driving with ability to confabulate verbally and directionally. Shawna OrleansMelanie relates that her understanding of [name]'s medical directives is that she would not want to have her life prolonged without quality and thus she agreed to a DNR on [name]'s behalf.  Bonnie's cousin [name] financial POA only and there are some complex issues surrounding desires for long term care.  At this time, [name] does not fully understand all that is happening but is able to make her desires in the moment known.  Shawna OrleansMelanie reports besides the probable beginnings of getting lost when driving, she has been self neglectful as it relates to her health.  Shawna OrleansMelanie is not aware of an advanced directive but dose possess the medical POA.  Currently, [name]'s course is being complicated by afib with hypotension. She is warm and perfusing ,awake and alert despite BP in 60's systolic.    Goals of Care: 1.  Code Status: DNR   2. Scope of Treatment: Treat the treatable short of CPR and life support.  Shawna OrleansMelanie understands that their may be  limitations to this.   4. Disposition: to be determined. We talked about SNF as well as Hospice if she continues to decline.   3. Symptom Management:   1. Anxiety/Agitation: Shawna OrleansMelanie states the ativan made her crazy, so I have discontinued it until I speak with Dr. Cena BentonVega. May need haldol, librium or Neurotin which works well for some in withdrawal 2. Pain: arm pain related to IV potassium infusion- decrease rate and use hot pack 3. Bowel Regimen: last stool documented 10/8 4. Delirium: high risk due to history of advancing dementia, and possible alcohol withdrawal 5. Fever: prn tylenol 6. UTI continue standard of care treatment. 7. Patient was deemed to have capacity for decision making last week. Would recommend reeval.  4. Psychosocial: Widowed 6 yrs ago, spouse name [name].  Lottie DawsonCousin [name] and her son have roles as financial POA , no children.  5. Spiritual: Not addressed at present due to patient discomfort        Patient Documents Completed or Given: Document Given Completed  Advanced Directives Pkt    MOST    DNR    Gone from My Sight    Hard Choices      Brief HPI: 80 yr old white female with limited capacity to help with decision making. Consult requested for goals of care and symptom recs.   ROS: left arm pain due to IV infusion    PMH:  Past Medical History  Diagnosis  Date  . Arthritis   . Fibromyalgia   . Diverticulosis   . Hypothyroidism   . Osteoarthritis   . DJD (degenerative joint disease)   . Diverticulitis 07/01/2011  . Diverticulosis of sigmoid colon 05/09/2014     PSH: Past Surgical History  Procedure Laterality Date  . Ectopic pregnancy surgery    . Abdominal hysterectomy    . Abdominal adhesion surgery    . Knee arthroplasty    . Colonoscopy  2011    [name]  . Laparotomy N/A 05/12/2014    Procedure: OPEN EXPLORATORY LAPAROTOMY, SIGMOID COLECTOMY, LYSIS OF ADHESIONS, SEROSAL REPAIR, COLOSTOMY;  Surgeon: [name], MD;   Location: [facility];  Service: General;  Laterality: N/A;   I have reviewed the FH and SH and  If appropriate update it with new information. Allergies  Allergen Reactions  . Antihistamines, Chlorpheniramine-Type Other (See Comments)    headache  . Codeine     headache   Scheduled Meds: . antiseptic oral rinse  7 mL Mouth Rinse q12n4p  . antiseptic oral rinse  7 mL Mouth Rinse BID  . chlorhexidine  15 mL Mouth Rinse BID  . heparin  5,000 Units Subcutaneous 3 times per day  . Influenza vac split quadrivalent PF  0.5 mL Intramuscular Tomorrow-1000  . lactated ringers  1,000 mL Intravenous Once  . levothyroxine  60 mcg Intravenous Daily  . lip balm  1 application Topical BID  . piperacillin-tazobactam (ZOSYN)  IV  3.375 g Intravenous 3 times per day  . potassium chloride  10 mEq Intravenous Q1 Hr x 4  . vancomycin  500 mg Intravenous Q12H   Continuous Infusions: . sodium chloride 150 mL/hr at 05/14/14 1920  . bupivacaine 0.25 % ON-Q pump DUAL CATH 300 mL     PRN Meds:.acetaminophen, acetaminophen, alum & mag hydroxide-simeth, diphenhydrAMINE, haloperidol lactate, magic mouthwash, menthol-cetylpyridinium, ondansetron (ZOFRAN) IV, ondansetron (ZOFRAN) IV, phenol, promethazine    BP 61/46  Pulse 32  Temp(Src) 98 F (36.7 C) (Axillary)  Resp 24  Ht 5\' 2"  (1.575 m)  Wt 58.5 kg (128 lb 15.5 oz)  BMI 23.58 kg/m2  SpO2 91%   PPS: 30-40%   Intake/Output Summary (Last 24 hours) at 05/15/14 1403 Last data filed at 05/15/14 1240  Gross per 24 hour  Intake 4039.5 ml  Output   3700 ml  Net  339.5 ml   LBM: 10/18  Physical Exam:  General: slightly agitated by lines and mask, oriented to herself but no judgement or insight into what is happening right now. Becomes easily agitated HEENT:   PERRL, EOMI, aniciteric, mm dry Chest:   Decreased , distant some crackles at the base CVS:  Irregular, distant, S1, S2 Abdomen: ostomy intact LLQ, Ext: edema throughout all  extremities, Neuro: awake , alert but agitated able to recall friends but impulsive and having difficulty remaining focused. Insight slightly impaired (goes back to taking off oxygen, asks multiple times about IV coming out)  Labs: CBC    Component Value Date/Time   WBC 15.7* 05/15/2014 0345   WBC 11.1* 05/28/2011 1302   RBC 3.38* 05/15/2014 0345   RBC 3.15* 09/22/2012 0515   RBC 3.86 05/28/2011 1302   HGB 10.1* 05/15/2014 0345   HGB 11.5* 05/28/2011 1302   HCT 29.6* 05/15/2014 0345   HCT 35.2 05/28/2011 1302   PLT 235 05/15/2014 0345   PLT 481* 05/28/2011 1302   MCV 87.6 05/15/2014 0345   MCV 91.2 05/28/2011 1302   MCH  29.9 05/15/2014 0345   MCH 29.8 05/28/2011 1302   MCHC 34.1 05/15/2014 0345   MCHC 32.7 05/28/2011 1302   RDW 15.3 05/15/2014 0345   RDW 16.4* 05/28/2011 1302   LYMPHSABS 0.8 05/08/2014 1608   LYMPHSABS 1.2 05/28/2011 1302   MONOABS 0.6 05/08/2014 1608   MONOABS 1.1* 05/28/2011 1302   EOSABS 0.0 05/08/2014 1608   EOSABS 0.1 05/28/2011 1302   BASOSABS 0.0 05/08/2014 1608   BASOSABS 0.0 05/28/2011 1302     CMP     Component Value Date/Time   NA 137 05/15/2014 0345   K 3.4* 05/15/2014 0345   CL 100 05/15/2014 0345   CO2 26 05/15/2014 0345   GLUCOSE 116* 05/15/2014 0345   BUN 16 05/15/2014 0345   CREATININE 0.62 05/15/2014 0345   CALCIUM 7.5* 05/15/2014 0345   PROT 6.4 05/09/2014 0510   ALBUMIN 3.3* 05/09/2014 0510   AST 27 05/09/2014 0510   ALT 17 05/09/2014 0510   ALKPHOS 58 05/09/2014 0510   BILITOT 0.7 05/09/2014 0510   GFRNONAA 79* 05/15/2014 0345   GFRAA >90 05/15/2014 0345    Chest Xray Reviewed/Impressions:  1. Interval development of a small right-sided effusion with  worsening bibasilar opacities, right greater than left, atelectasis  versus infiltrate.  2. Pulmonary venous congestion without frank evidence of edema.      Time In Time Out Total Time Spent with Patient Total Overall Time  100 pm 155 pm 20 min 55 min     Greater than 50%  of this time was spent counseling and coordinating care related to the above assessment and plan.   Jakerria Kingbird L. Ladona Ridgel, MD MBA The Palliative Medicine Team at [facility] Phone: [phone] Pager: [phone] ( Use team phone after hours)

## 2014-05-15 NOTE — Progress Notes (Signed)
Patient ID: [name], female   DOB: 1926-01-25, 80 y.o.   MRN: [id number] 3 Days Post-Op  Subjective: Alert, some baseline confusion.  Denies abdominal pain  Objective: Vital signs in last 24 hours: Temp:  [97.8 F (36.6 C)-98.3 F (36.8 C)] 98.3 F (36.8 C) (10/19 0400) Pulse Rate:  [64-96] 96 (10/19 0700) Resp:  [13-30] 23 (10/19 0700) BP: (73-142)/(40-100) 128/85 mmHg (10/19 0700) SpO2:  [83 %-100 %] 95 % (10/19 0700) FiO2 (%):  [35 %] 35 % (10/18 2300) Weight:  [128 lb 15.5 oz (58.5 kg)] 128 lb 15.5 oz (58.5 kg) (10/19 0500) Last BM Date: 05/14/14  Intake/Output from previous day: 10/18 0701 - 10/19 0700 In: 4665.9 [I.V.:3343.4; IV Piggyback:1262.5] Out: 2725 [Urine:1025; Emesis/NG output:1700] Intake/Output this shift: Total I/O In: 120 [Other:60; NG/GT:60] Out: -   General appearance: alert, no distress and confused but conversant GI: normal findings: soft, non-tender and colostomy with stool Incision/Wound: no erythema or drainage  Lab Results:   Recent Labs  05/14/14 1010 05/15/14 0345  WBC 16.2* 15.7*  HGB 9.4* 10.1*  HCT 27.4* 29.6*  PLT 229 235   BMET  Recent Labs  05/14/14 0705 05/15/14 0345  NA 137 137  K 3.1* 3.4*  CL 99 100  CO2 23 26  GLUCOSE 114* 116*  BUN 23 16  CREATININE 0.95 0.62  CALCIUM 7.8* 7.5*     Studies/Results: Dg Chest Port 1 View  05/14/2014   CLINICAL DATA:  Acute respiratory failure.  EXAM: PORTABLE CHEST - 1 VIEW  COMPARISON:  05/08/2014.  FINDINGS: Stable enlarged cardiac silhouette. The previously noted large hiatal hernia appears smaller. Interval small to moderate-sized left pleural effusion and small right pleural effusion. Increased prominence of the pulmonary vasculature and mild increase in prominence of the interstitial markings. Interval mild patchy opacity at both lung bases. Diffuse osteopenia. Nasogastric tube extending into the stomach.  IMPRESSION: 1. Interval mild changes of congestive heart  failure. 2. Stable cardiomegaly. 3. Bibasilar patchy atelectasis or pneumonia. 4. Decreased size of the previously seen large hiatal hernia.   Electronically Signed   By: Gordan PaymentSteve  Reid M.D.   On: 05/14/2014 16:14    Anti-infectives: Anti-infectives   Start     Dose/Rate Route Frequency Ordered Stop   05/15/14 0600  vancomycin (VANCOCIN) 500 mg in sodium chloride 0.9 % 100 mL IVPB     500 mg 100 mL/hr over 60 Minutes Intravenous Every 12 hours 05/14/14 1602     05/14/14 1630  piperacillin-tazobactam (ZOSYN) IVPB 3.375 g     3.375 g 12.5 mL/hr over 240 Minutes Intravenous 3 times per day 05/14/14 1602     05/14/14 1630  vancomycin (VANCOCIN) IVPB 1000 mg/200 mL premix     1,000 mg 200 mL/hr over 60 Minutes Intravenous  Once 05/14/14 1602 05/14/14 1751   05/11/14 0600  cefoTEtan (CEFOTAN) 2 g in dextrose 5 % 50 mL IVPB  Status:  Discontinued     2 g 100 mL/hr over 30 Minutes Intravenous On call to O.R. 05/10/14 1512 05/12/14 1637   05/09/14 0215  cefTRIAXone (ROCEPHIN) 1 g in dextrose 5 % 50 mL IVPB  Status:  Discontinued     1 g 100 mL/hr over 30 Minutes Intravenous Daily at bedtime 05/09/14 0203 05/14/14 1536      Assessment/Plan: s/p Procedure(s): OPEN EXPLORATORY LAPAROTOMY, SIGMOID COLECTOMY, LYSIS OF ADHESIONS, SEROSAL REPAIR, COLOSTOMY Making slow steady progress. Has bowel function. D/C NGT.  NPO for now    LOS: 7 days  Mahrukh Seguin T 05/15/2014

## 2014-05-15 NOTE — Progress Notes (Signed)
Chaplain visited briefly with pt. Pt is hard of hearing. Pt expressed that when she came to the hospital she did not expect the developments that occurred in her health and treatment, saying she came because of her leg but found out about other things that needed attention, and now "here I am." Chaplain suggests spiritual care follow-up when POA is present.    05/15/14 1128  Clinical Encounter Type  Visited With Patient  Visit Type Initial  Stress Factors  Patient Stress Factors Health changes   Arneta ClicheMcCray, Undra Trembath O, Chaplain 05/15/2014 11:31 AM

## 2014-05-15 NOTE — Progress Notes (Signed)
Pt moved from bed to chair with assistance of occupational therapist and RN. Pt's HR increased and stayed sustained in 160s with BP 100s/80s. Pt asymptomatic. MD Cena BentonVega notified immediately. As per MD order, 12-lead EKG ordered and results show no AFib and only NSR with ST. MD notified of results. MD wants to continue to monitor pt and no new orders at this time.

## 2014-05-15 NOTE — Progress Notes (Signed)
eLink Physician-Brief Progress Note Patient Name: [name] DOB: 04/29/1926 MRN: [id number]   Date of Service  05/15/2014  HPI/Events of Note  Sustained HR in 160-170's. BP 104/67.   eICU Interventions  Will try metoprolol x 1 for rate control. [facility] has been notified by nursing.      Intervention Category Intermediate Interventions: Arrhythmia - evaluation and management  Shana Zavaleta S. 05/15/2014, 11:05 PM

## 2014-05-15 NOTE — Evaluation (Addendum)
Occupational Therapy Evaluation Patient Details Name: [name] MRN: [id number] DOB: 1926-03-23 Today's Date: 05/15/2014    History of Present Illness 80 yo female admitted 05/08/14 withStricture of sigmoid colon with complete obstruction ,s/p colectomy/colostomy 05/12/2014   Clinical Impression   Pt assisted up to chair with nursing present. HR reading in 160s to low 200s and nursing aware and informed MD. With rest in chair, HR down to 169 when OT left the room. Nursing to monitor pt. Pt is very HOH so she requires increased time and tactile cues as well to help complete activities. Pt will need SNF at d/c.     Follow Up Recommendations  SNF;Supervision/Assistance - 24 hour    Equipment Recommendations  3 in 1 bedside comode    Recommendations for Other Services       Precautions / Restrictions Precautions Precautions: Fall Precaution Comments: Monitor HR; abd incision, On Q pump very short line Required Braces or Orthoses: Other Brace/Splint Other Brace/Splint: abd. binder (however, no binder in room. per nursing, soiled but stated ok to get pt up) Restrictions Weight Bearing Restrictions: No      Mobility Bed Mobility Overal bed mobility: Needs Assistance Bed Mobility: Rolling;Sidelying to Sit Rolling: Max assist Sidelying to sit: +2 for safety/equipment;Mod assist       General bed mobility comments: use of pad to help pt roll onto side. +2 for equipment and for safety and mod assist and increased time.  Transfers Overall transfer level: Needs assistance Equipment used: Rolling walker (2 wheeled) Transfers: Sit to/from Stand Sit to Stand: +2 physical assistance;Mod assist Stand pivot transfers: +2 physical assistance;Max assist       General transfer comment: pt with difficulty picking up feet to transfer to chair. pt attempting to turn/pivot on feet but needing max assist for manuevering walker, weight shift, and pulling up chair to safely descend to  chair.     Balance                                            ADL Overall ADL's : Needs assistance/impaired Eating/Feeding:  (not assessed)   Grooming: Moderate assistance;Bed level Grooming Details (indicate cue type and reason): for thoroughness Upper Body Bathing: Maximal assistance;Sitting   Lower Body Bathing: +2 for physical assistance;Total assistance;Sit to/from stand   Upper Body Dressing : Total assistance;Sitting   Lower Body Dressing: +2 for physical assistance;Total assistance;Sit to/from stand   Toilet Transfer: +2 for physical assistance;Maximal assistance;Stand-pivot;RW   Toileting- Clothing Manipulation and Hygiene: +2 for physical assistance;Total assistance;Sit to/from stand         General ADL Comments: Pt with difficulty picking up feet to takes step around to chair today. Pt turning slightly but not able to manuever walker and step around so chair pulled up behind her and required assist to lower safely to chair. Nursing present. Pt with HR jumping to 160s-low 200s  with activity even once seated in the chair. With several minutes of rest, pt HR still 169-170s. Nursing informed MD. Nursing stated to let pt rest in the chair for now. Pt is HOH and therefore requires increased time to complete tasks. Note no abdominal binder in room. Nursing states it was soiled earlier but ok to get pt OOB. Noted pt with difficulty reaaching to comb hair with R UE.      Vision  Perception     Praxis      Pertinent Vitals/Pain Pain Assessment: No/denies pain     Hand Dominance     Extremity/Trunk Assessment Upper Extremity Assessment Upper Extremity Assessment: Generalized weakness (noted decreased reach with R UE to comb hair)           Communication Communication Communication: HOH   Cognition Arousal/Alertness: Awake/alert Behavior During Therapy: Restless Overall Cognitive Status: Difficult to assess (pt HOH  and restless. Followed commands with time)                     General Comments       Exercises       Shoulder Instructions      Home Living Family/patient expects to be discharged to:: Skilled nursing facility Living Arrangements: Alone                           Home Equipment: Dan HumphreysWalker - 2 wheels;Cane - single point          Prior Functioning/Environment Level of Independence: Independent with assistive device(s)             OT Diagnosis: Generalized weakness   OT Problem List: Decreased strength;Decreased knowledge of use of DME or AE;Decreased activity tolerance   OT Treatment/Interventions: Self-care/ADL training;Patient/family education;Therapeutic activities;DME and/or AE instruction    OT Goals(Current goals can be found in the care plan section) Acute Rehab OT Goals Patient Stated Goal: wanted to comb hair OT Goal Formulation: With patient Time For Goal Achievement: 05/29/14 Potential to Achieve Goals: Good  OT Frequency: Min 2X/week   Barriers to D/C:            Co-evaluation              End of Session Equipment Utilized During Treatment: Rolling walker  Activity Tolerance: Other (comment) (HR increased) Patient left: in chair;with call bell/phone within reach;with nursing/sitter in room   Time: 1205-1227 OT Time Calculation (min): 22 min Charges:  OT General Charges $OT Visit: 1 Procedure OT Evaluation $Initial OT Evaluation Tier I: 1 Procedure OT Treatments $Therapeutic Activity: 8-22 mins G-Codes:    Lennox LaityStone, Mariya Mottley Stafford [phone] 05/15/2014, 1:05 PM

## 2014-05-15 NOTE — Progress Notes (Signed)
Foley d/c as per MD order. Pt DTV by 1600 and was no able to void. As per order, pt catheterized and less than 50 mL drained. Foley catheter placed as per MD order. Pt draining clear yellow urine now.

## 2014-05-15 NOTE — Progress Notes (Signed)
PULMONARY / CRITICAL CARE MEDICINE   Name: [name] MRN: [id number] DOB: 1925/09/14  LOS 7 days PCP  Duane Lope, MD    ADMISSION DATE:  05/08/2014 CONSULTATION DATE:  05/14/2014   REFERRING MD :  Dr Penny Pia of [facility]  CHIEF COMPLAINT:  Circulator shock  INITIAL PRESENTATION:  80 year old frrail female with dementia, HOH, fraility NOS. Presented with chronic worsening of lower extremity edeem and mild abdominal pain on 05/08/2014 and diagnosed with chronic colonic obstruction. Treated medically /. Conservatively initially but underwent p-lap and foind to have sigmoid colon stricture and s/p sigmoid colectory with end colostomy, lysis of adhension, serosal repair and repair of supra-umbiclical hernia. In the immediate post op period 05/12/14 she was hypotensive and responded to fluid. Then on 05/14/2014 earl hours found to be in A Fib RVR with hypotension and Rx with amio gtt. By PM 10/19\8/15 was back in NSR but had persistent hypotension and PCCM consulted for hypotensin co-management   STUDIES /EVENT 05/08/2014 - admit 05/12/14 - Stricture of sigmoid colon with complete obstruction s/p colectomy/colostomy  05/14/2014 - PCCM consult for hypotension  SUBJECTIVE:   05/15/14: Awake , HOH, mild confusion, NAD. Off neo     VITAL SIGNS: Temp:  [97.4 F (36.3 C)-98.3 F (36.8 C)] 97.4 F (36.3 C) (10/19 0800) Pulse Rate:  [64-96] 93 (10/19 0800) Resp:  [13-30] 27 (10/19 0800) BP: (80-142)/(41-100) 108/61 mmHg (10/19 0800) SpO2:  [83 %-100 %] 93 % (10/19 0800) FiO2 (%):  [35 %] 35 % (10/18 2300) Weight:  [128 lb 15.5 oz (58.5 kg)] 128 lb 15.5 oz (58.5 kg) (10/19 0500) HEMODYNAMICS:   VENTILATOR SETTINGS: Vent Mode:  [-]  FiO2 (%):  [35 %] 35 % INTAKE / OUTPUT:  Intake/Output Summary (Last 24 hours) at 05/15/14 0950 Last data filed at 05/15/14 0747  Gross per 24 hour  Intake 5256.3 ml  Output   3875 ml  Net 1381.3 ml    PHYSICAL EXAMINATION: General:  Frail  female. In bed, HOH, Confused Neuro:  Awake and alert, confused HEENT:  No JVD/LAN Cardiovascular: HSiR IR with CVR  Lungs: Decreased bs bases Abdomen:  Abd binder in place, Infusion pump noted Musculoskeletal:  No cyanosis. No clubbing No edema Skin:  Frail  LABS: PULMONARY No results found for this basename: PHART, PCO2, PCO2ART, PO2, PO2ART, HCO3, TCO2, O2SAT,  in the last 168 hours  CBC  Recent Labs Lab 05/11/14 0420 05/14/14 1010 05/15/14 0345  HGB 13.3 9.4* 10.1*  HCT 39.8 27.4* 29.6*  WBC 5.1 16.2* 15.7*  PLT 356 229 235    COAGULATION  Recent Labs Lab 05/08/14 1608  INR 0.99    CARDIAC    Recent Labs Lab 05/08/14 1608  TROPONINI <0.30    Recent Labs Lab 05/08/14 1615  PROBNP 1099.0*     CHEMISTRY  Recent Labs Lab 05/10/14 0455 05/11/14 0420 05/12/14 1806  05/14/14 0705 05/15/14 0345  NA 134* 136* 138  --  137 137  K 3.3* 3.5* 2.6*  < > 3.1* 3.4*  CL 91* 95* 95*  --  99 100  CO2 26 24 19   --  23 26  GLUCOSE 82 66* 89  --  114* 116*  BUN 30* 30* 19  --  23 16  CREATININE 0.81 0.79 0.71  --  0.95 0.62  CALCIUM 8.7 8.6 7.6*  --  7.8* 7.5*  MG  --   --   --   --  1.5  --  PHOS  --   --   --   --  2.8  --   < > = values in this interval not displayed. Estimated Creatinine Clearance: 39.9 ml/min (by C-G formula based on Cr of 0.62).   LIVER  Recent Labs Lab 05/08/14 1608 05/09/14 0510  AST 28 27  ALT 18 17  ALKPHOS 60 58  BILITOT 0.7 0.7  PROT 7.0 6.4  ALBUMIN 3.5 3.3*  INR 0.99  --      INFECTIOUS  Recent Labs Lab 05/14/14 1010  LATICACIDVEN 1.1     ENDOCRINE CBG (last 3)  No results found for this basename: GLUCAP,  in the last 72 hours       IMAGING x48h Dg Chest Port 1 View  05/14/2014   CLINICAL DATA:  Acute respiratory failure.  EXAM: PORTABLE CHEST - 1 VIEW  COMPARISON:  05/08/2014.  FINDINGS: Stable enlarged cardiac silhouette. The previously noted large hiatal hernia appears smaller. Interval  small to moderate-sized left pleural effusion and small right pleural effusion. Increased prominence of the pulmonary vasculature and mild increase in prominence of the interstitial markings. Interval mild patchy opacity at both lung bases. Diffuse osteopenia. Nasogastric tube extending into the stomach.  IMPRESSION: 1. Interval mild changes of congestive heart failure. 2. Stable cardiomegaly. 3. Bibasilar patchy atelectasis or pneumonia. 4. Decreased size of the previously seen large hiatal hernia.   Electronically Signed   By: Gordan PaymentSteve  Reid M.D.   On: 05/14/2014 16:14       ASSESSMENT / PLAN:  PULMONARY OETT - she is DNI A: acute hypoxemic resp failure but with normal wob. This is due to post op atelectais and possibly effusion  P:   Face mask o2 Focus on concurrent comfort; can consider lasix or bipap if needed  CARDIOVASCULAR CVL - too frail and high risk A: Circulatory shock following a fib   - off pressors 05/15/14 AM  P:  Fluid maintenance per [facility]   RENAL A:  low k P:   Replete K+  GASTROINTESTINAL A:  S/p p lap 05/12/14 P:   Per [facility]  HEMATOLOGIC\ A:  Anemia of post op and critical illness P:  - PRBC for hgb </= 6.9gm%    - exceptions are   -  if ACS susepcted/confirmed then transfuse for hgb </= 8.0gm%,  or    -  If septic shock first 24h and scvo2 < 70% then transfuse for hgb </= 9.0gm%   - active bleeding with hemodynamic instability, then transfuse regardless of hemoglobin value   At at all times try to transfuse 1 unit prbc as possible with exception of active hemorrhage    INFECTIOUS Results for orders placed during the hospital encounter of 05/08/14  URINE CULTURE     Status: None   Collection Time    05/08/14 10:58 PM      Result Value Ref Range Status   Specimen Description URINE, RANDOM   Final   Special Requests NONE   Final   Culture  Setup Time     Final   Value: 05/09/2014 08:19     Performed at [facility]   Colony Count      Final   Value: >=100,000 COLONIES/ML     [facility]formed at [facility]   Culture     Final   Value: PROTEUS MIRABILIS     Performed at [facility]   Report Status 05/13/2014 FINAL   Final   Organism ID, Bacteria PROTEUS  MIRABILIS   Final  SURGICAL PCR SCREEN     Status: None   Collection Time    05/10/14  6:42 PM      Result Value Ref Range Status   MRSA, PCR NEGATIVE  NEGATIVE Final   Staphylococcus aureus NEGATIVE  NEGATIVE Final   Comment:            The Xpert SA Assay (FDA     approved for NASAL specimens     in patients over 61 years of age),     is one component of     a comprehensive surveillance     program.  Test performance has     been validated by [employer] for patients greater     than or equal to 74 year old.     It is not intended     to diagnose infection nor to     guide or monitor treatment.  CULTURE, BLOOD (ROUTINE X 2)     Status: None   Collection Time    05/14/14  7:05 AM      Result Value Ref Range Status   Specimen Description BLOOD LEFT ARM   Final   Special Requests     Final   Value: BOTTLES DRAWN AEROBIC AND ANAEROBIC 10CC BLUE BOTTLE, 5CC RED BOTTLE   Culture  Setup Time     Final   Value: 05/14/2014 15:18     Performed at [facility]   Culture     Final   Value:        BLOOD CULTURE RECEIVED NO GROWTH TO DATE CULTURE WILL BE HELD FOR 5 DAYS BEFORE ISSUING A FINAL NEGATIVE REPORT     Note: Culture results may be compromised due to an excessive volume of blood received in culture bottles.     Performed at [facility]   Report Status PENDING   Incomplete  CULTURE, BLOOD (ROUTINE X 2)     Status: None   Collection Time    05/14/14 10:11 AM      Result Value Ref Range Status   Specimen Description BLOOD LEFT ARM   Final   Special Requests BOTTLES DRAWN AEROBIC ONLY 3CC BLUE BOTTLE   Final   Culture  Setup Time     Final   Value: 05/14/2014 18:50     Performed at [facility]   Culture     Final    Value:        BLOOD CULTURE RECEIVED NO GROWTH TO DATE CULTURE WILL BE HELD FOR 5 DAYS BEFORE ISSUING A FINAL NEGATIVE REPORT     Performed at [facility]   Report Status PENDING   Incomplete    A:  Peritonitis and proteus UTI P:   Abx per [facility] and [facility] as below Anti-infectives   Start     Dose/Rate Route Frequency Ordered Stop   05/15/14 0600  vancomycin (VANCOCIN) 500 mg in sodium chloride 0.9 % 100 mL IVPB     500 mg 100 mL/hr over 60 Minutes Intravenous Every 12 hours 05/14/14 1602     05/14/14 1630  piperacillin-tazobactam (ZOSYN) IVPB 3.375 g     3.375 g 12.5 mL/hr over 240 Minutes Intravenous 3 times per day 05/14/14 1602     05/14/14 1630  vancomycin (VANCOCIN) IVPB 1000 mg/200 mL premix     1,000 mg 200 mL/hr over 60 Minutes Intravenous  Once 05/14/14 1602 05/14/14 1751   05/11/14  0600  cefoTEtan (CEFOTAN) 2 g in dextrose 5 % 50 mL IVPB  Status:  Discontinued     2 g 100 mL/hr over 30 Minutes Intravenous On call to O.R. 05/10/14 1512 05/12/14 1637   05/09/14 0215  cefTRIAXone (ROCEPHIN) 1 g in dextrose 5 % 50 mL IVPB  Status:  Discontinued     1 g 100 mL/hr over 30 Minutes Intravenous Daily at bedtime 05/09/14 0203 05/14/14 1536      ENDOCRINE  A:  At risk for   Sugar fluctuations P:   Monitor per [facility]  NEUROLOGIC A:  Awake and confused P:   monitor    CODE STATUS    Code Status Orders        Start     Ordered   05/08/14 2148  Do not attempt resuscitation (DNR)   Continuous    Question Answer Comment  In the event of cardiac or respiratory ARREST Do not call a "code blue"   In the event of cardiac or respiratory ARREST Do not perform Intubation, CPR, defibrillation or ACLS   In the event of cardiac or respiratory ARREST Use medication by any route, position, wound care, and other measures to relive pain and suffering. May use oxygen, suction and manual treatment of airway obstruction as needed for comfort.      05/08/14 2148       FAMILY Updates 05/15/14: : DPOA [name] expressed continued medical care but if actively dying natural death preferred Interdisciplinary Family Meeting: N/a   Brett CanalesSteve Minor ACNP [facility] PCCM Pager [phone] till 3 pm If no answer page [phone] 05/15/2014, 10:03 AM   STAFF MD  - personally evaluated patient. Elicited following key elements personally: she is off pressors. She is more alert. Abdomen looks healthy. Abx and monitoring per [facility]. Focus on concurrent palliation and medical care keeping in mind code status is pccm rec. PCCM will sign off. Rest per NP   Dr. Kalman ShanMurali Virginio Isidore, M.D., [facility].C.C.P Pulmonary and Critical Care Medicine Staff Physician  System [place] [facility] Pager: [phone], If no answer or between  15:00h - 7:00h: call [phone]  05/15/2014 11:01 AM

## 2014-05-15 NOTE — Consult Note (Signed)
WOC ostomy consult note Stoma type/location: LLQ Colostomy. 2-piece pouching system applied in OR leaking. Stomal assessment/size: 2 and 1/4 inches, round, edematous, budded Peristomal assessment: Intact Treatment options for stomal/peristomal skin: Skin barrier ring Output 150ml of soft light brown liquid  stool Ostomy pouching: 1pc pouching system Hart [place] [phone]) with skin barrier ring [place] [phone])  Education provided: No education today.  Patient is confused and does not recall meeting me on Thursday of last week for pre-operative stoma site selection. There is a massive clean up is required due to leaking pouch upon my arrival.  I will provide a plastic bag for the bedside that has her stoma pattern, spare pouching systems and skin barrier rings as well as a teaching booklet for future sessions. WOC Nursing will follow along with you. Thanks, Ladona MowLaurie Kimiye Strathman, MSN, RN, GNP, [place], CWON-AP [phone])

## 2014-05-15 NOTE — Progress Notes (Signed)
CARE MANAGEMENT NOTE 05/15/2014  Patient:  Adelina MingsCERWIN,Charleigh M   Account Number:  [id number]  Date Initiated:  05/09/2014  Documentation initiated by:  Lorenda IshiharaPEELE,SUZANNE  Subjective/Objective Assessment:   80 yo female admitted with SBO. PTA lived at home alone.     Action/Plan:   Awaiting PT evaluations, may need SNF at d/c.   Anticipated DC Date:  05/15/2014   Anticipated DC Plan:  SKILLED NURSING FACILITY  In-house referral  Clinical Social Worker      DC Planning Services  CM consult      Choice offered to / List presented to:             Status of service:  In process, will continue to follow Medicare Important Message given?   (If response is "NO", the following Medicare IM given date fields will be blank) Date Medicare IM given:   Medicare IM given by:   Date Additional Medicare IM given:   Additional Medicare IM given by:    Discharge Disposition:    Per UR Regulation:  Reviewed for med. necessity/level of care/duration of stay  If discussed at Long Length of Stay Meetings, dates discussed:    Comments:  [id number]/Heli Dino Earlene Plateravis, RN, BSN, CCM Chart reviewed. Discharge needs and patient's stay to be reviewed and followed by case manager. 80 yo female admitted 05/08/14 withStricture of sigmoid colon with complete obstruction ,s/p colectomy/colostomy 05/12/2014

## 2014-05-15 NOTE — Progress Notes (Signed)
[facility] PROGRESS NOTE  [name] MWU:[id number]:[id number] DOB: 1926-05-18 DOA: 05/08/2014 PCP:  Duane Lopeoss, Alan, MD  Assessment/Plan:  Principal Problem:   Hypotension: new problem - Resolved - PCCM assisted and patient was placed on phenylephrine which has been discontinued.  Atrial fibrillation: New problem - Pt currently in sinus tachycardia - potassium currently being replaced - Pt is s/p magnesium infusion. Will reassess mg levels next am.  Hypokalemia - Will replace K levels - Agree with potassium replacement IV  Colon obstruction - Pt is s/p date 2: lysis of adhesions, sigmoid colectomy with end colostomy, serosal repair, repair of supraumbilical ventral hernia - Will continue pain control - GI signed off - General surgery on board.  Active Problems:    Fibromyalgia - Stable on current medical management  Hypothyroidism - Will place on IV synthroid - Pt still npo and has not received her synthroid as a result.    HOH (hard of hearing) - Stable no new complaints    Dementia - Continue to monitor, no need for restraints at this juncture  Urinary tract infection - antibiotic coverage recently increased due to leukocytosis and tachycardia  Code Status: DO NOT RESUSCITATE Family Communication: None at bedside  Disposition Plan: Per general surgery, currently in ICU   Consultants:  *General surgery  GI>>>05/11/14  Procedures:  Pending  Antibiotics:  Vanc and Zosyn  HPI/Subjective: Patient is more alert today and reports feeling better.  Objective: Filed Vitals:   05/15/14 1000  BP: 120/67  Pulse: 84  Temp:   Resp: 24    Intake/Output Summary (Last 24 hours) at 05/15/14 1243 Last data filed at 05/15/14 0747  Gross per 24 hour  Intake 4456.2 ml  Output   3775 ml  Net  681.2 ml   Filed Weights   05/13/14 0400 05/14/14 0400 05/15/14 0500  Weight: 64.5 kg (142 lb 3.2 oz) 66.3 kg (146 lb 2.6 oz) 58.5 kg (128 lb 15.5 oz)     Exam:   General:  Pt alert and awake  Cardiovascular: Regular rate and rhythm  Respiratory: No audible wheezes, Pt on supplemental oxygen, no increased wob  Abdomen: incision intact with no active bleeding  Data Reviewed: Basic Metabolic Panel:  Recent Labs Lab 05/10/14 0455 05/11/14 0420 05/12/14 1806 05/13/14 2029 05/14/14 0705 05/15/14 0345  NA 134* 136* 138  --  137 137  K 3.3* 3.5* 2.6* 3.3* 3.1* 3.4*  CL 91* 95* 95*  --  99 100  CO2 26 24 19   --  23 26  GLUCOSE 82 66* 89  --  114* 116*  BUN 30* 30* 19  --  23 16  CREATININE 0.81 0.79 0.71  --  0.95 0.62  CALCIUM 8.7 8.6 7.6*  --  7.8* 7.5*  MG  --   --   --   --  1.5  --   PHOS  --   --   --   --  2.8  --    Liver Function Tests:  Recent Labs Lab 05/08/14 1608 05/09/14 0510  AST 28 27  ALT 18 17  ALKPHOS 60 58  BILITOT 0.7 0.7  PROT 7.0 6.4  ALBUMIN 3.5 3.3*    Recent Labs Lab 05/08/14 1608  LIPASE 60*   No results found for this basename: AMMONIA,  in the last 168 hours CBC:  Recent Labs Lab 05/08/14 1608 05/09/14 0510 05/10/14 0455 05/11/14 0420 05/14/14 1010 05/15/14 0345  WBC 4.8 5.3 6.1 5.1 16.2* 15.7*  NEUTROABS  3.3  --   --   --   --   --   HGB 14.8 14.8 13.1 13.3 9.4* 10.1*  HCT 42.9 43.3 39.1 39.8 27.4* 29.6*  MCV 86.5 86.6 87.9 88.4 88.4 87.6  PLT 410* 415* 405* 356 229 235   Cardiac Enzymes:  Recent Labs Lab 05/08/14 1608  TROPONINI <0.30   BNP (last 3 results)  Recent Labs  03/22/14 1612 03/22/14 1628 05/08/14 1615  PROBNP 901.5* 893.5* 1099.0*   CBG: No results found for this basename: GLUCAP,  in the last 168 hours  Recent Results (from the past 240 hour(s))  URINE CULTURE     Status: None   Collection Time    05/08/14 10:58 PM      Result Value Ref Range Status   Specimen Description URINE, RANDOM   Final   Special Requests NONE   Final   Culture  Setup Time     Final   Value: 05/09/2014 08:19     Performed at [facility]   Colony  Count     Final   Value: >=100,000 COLONIES/ML     Performed at [facility]   Culture     Final   Value: PROTEUS MIRABILIS     Performed at [facility]   Report Status 05/13/2014 FINAL   Final   Organism ID, Bacteria PROTEUS MIRABILIS   Final  SURGICAL PCR SCREEN     Status: None   Collection Time    05/10/14  6:42 PM      Result Value Ref Range Status   MRSA, PCR NEGATIVE  NEGATIVE Final   Staphylococcus aureus NEGATIVE  NEGATIVE Final   Comment:            The Xpert SA Assay (FDA     approved for NASAL specimens     in patients over 78 years of age),     is one component of     a comprehensive surveillance     program.  Test performance has     been validated by [facility] for patients greater     than or equal to 78 year old.     It is not intended     to diagnose infection nor to     guide or monitor treatment.  CULTURE, BLOOD (ROUTINE X 2)     Status: None   Collection Time    05/14/14  7:05 AM      Result Value Ref Range Status   Specimen Description BLOOD LEFT ARM   Final   Special Requests     Final   Value: BOTTLES DRAWN AEROBIC AND ANAEROBIC 10CC BLUE BOTTLE, 5CC RED BOTTLE   Culture  Setup Time     Final   Value: 05/14/2014 15:18     Performed at [facility]   Culture     Final   Value:        BLOOD CULTURE RECEIVED NO GROWTH TO DATE CULTURE WILL BE HELD FOR 5 DAYS BEFORE ISSUING A FINAL NEGATIVE REPORT     Note: Culture results may be compromised due to an excessive volume of blood received in culture bottles.     Performed at [facility]   Report Status PENDING   Incomplete  CULTURE, BLOOD (ROUTINE X 2)     Status: None   Collection Time    05/14/14 10:11 AM      Result Value Ref  Range Status   Specimen Description BLOOD LEFT ARM   Final   Special Requests BOTTLES DRAWN AEROBIC ONLY 3CC BLUE BOTTLE   Final   Culture  Setup Time     Final   Value: 05/14/2014 18:50     Performed at [facility]   Culture      Final   Value:        BLOOD CULTURE RECEIVED NO GROWTH TO DATE CULTURE WILL BE HELD FOR 5 DAYS BEFORE ISSUING A FINAL NEGATIVE REPORT     Performed at [facility]   Report Status PENDING   Incomplete     Studies: Dg Chest Port 1 View  05/15/2014   CLINICAL DATA:  Chronic respiratory failure. Altered level of consciousness. Shortness of breath. Subsequent encounter.  EXAM: PORTABLE CHEST - 1 VIEW  COMPARISON:  05/14/2014; 05/08/2014; 04/22/2014  FINDINGS: Grossly unchanged cardiac silhouette and mediastinal contours with atherosclerotic plaque within the thoracic aorta. Interval development of a small right-sided effusion with unchanged small to moderate size left-sided pleural effusion. Slight worsening of bibasilar opacities, left greater than right. Mild pulmonary venous congestion without frank evidence of edema. No pneumothorax, though note, evaluation lung apices is excluded secondary to the patient's overlying chin. Regional soft tissues appear normal.  IMPRESSION: 1. Interval development of a small right-sided effusion with worsening bibasilar opacities, right greater than left, atelectasis versus infiltrate. 2. Pulmonary venous congestion without frank evidence of edema.   Electronically Signed   By: Simonne Come M.D.   On: 05/15/2014 11:32   Dg Chest Port 1 View  05/14/2014   CLINICAL DATA:  Acute respiratory failure.  EXAM: PORTABLE CHEST - 1 VIEW  COMPARISON:  05/08/2014.  FINDINGS: Stable enlarged cardiac silhouette. The previously noted large hiatal hernia appears smaller. Interval small to moderate-sized left pleural effusion and small right pleural effusion. Increased prominence of the pulmonary vasculature and mild increase in prominence of the interstitial markings. Interval mild patchy opacity at both lung bases. Diffuse osteopenia. Nasogastric tube extending into the stomach.  IMPRESSION: 1. Interval mild changes of congestive heart failure. 2. Stable cardiomegaly. 3.  Bibasilar patchy atelectasis or pneumonia. 4. Decreased size of the previously seen large hiatal hernia.   Electronically Signed   By: Gordan Payment M.D.   On: 05/14/2014 16:14    Scheduled Meds: . antiseptic oral rinse  7 mL Mouth Rinse q12n4p  . antiseptic oral rinse  7 mL Mouth Rinse BID  . chlorhexidine  15 mL Mouth Rinse BID  . heparin  5,000 Units Subcutaneous 3 times per day  . Influenza vac split quadrivalent PF  0.5 mL Intramuscular Tomorrow-1000  . lactated ringers  1,000 mL Intravenous Once  . lip balm  1 application Topical BID  . piperacillin-tazobactam (ZOSYN)  IV  3.375 g Intravenous 3 times per day  . potassium chloride  10 mEq Intravenous Q1 Hr x 4  . vancomycin  500 mg Intravenous Q12H   Continuous Infusions: . sodium chloride 150 mL/hr at 05/14/14 1920  . bupivacaine 0.25 % ON-Q pump DUAL CATH 300 mL      Time spent: > 35 minutes     Penny Pia  [facility] Pager [phone] If 7PM-7AM, please contact night-coverage at [email or web address], password [email or web address] 05/15/2014, 12:43 PM  LOS: 7 days

## 2014-05-16 DIAGNOSIS — I480 Paroxysmal atrial fibrillation: Secondary | ICD-10-CM

## 2014-05-16 LAB — CBC
HCT: 27.8 % — ABNORMAL LOW (ref 36.0–46.0)
HEMOGLOBIN: 9.5 g/dL — AB (ref 12.0–15.0)
MCH: 30 pg (ref 26.0–34.0)
MCHC: 34.2 g/dL (ref 30.0–36.0)
MCV: 87.7 fL (ref 78.0–100.0)
Platelets: 210 10*3/uL (ref 150–400)
RBC: 3.17 MIL/uL — ABNORMAL LOW (ref 3.87–5.11)
RDW: 15.2 % (ref 11.5–15.5)
WBC: 11.9 10*3/uL — ABNORMAL HIGH (ref 4.0–10.5)

## 2014-05-16 LAB — BASIC METABOLIC PANEL
Anion gap: 10 (ref 5–15)
BUN: 15 mg/dL (ref 6–23)
CHLORIDE: 100 meq/L (ref 96–112)
CO2: 26 mEq/L (ref 19–32)
Calcium: 7.3 mg/dL — ABNORMAL LOW (ref 8.4–10.5)
Creatinine, Ser: 0.64 mg/dL (ref 0.50–1.10)
GFR calc Af Amer: >90 mL/min (ref >90)
GFR calc non Af Amer: 79 mL/min — ABNORMAL LOW (ref >90)
GLUCOSE: 105 mg/dL — AB (ref 70–99)
POTASSIUM: 2.9 meq/L — AB (ref 3.7–5.3)
SODIUM: 136 meq/L — AB (ref 137–147)

## 2014-05-16 LAB — MAGNESIUM: MAGNESIUM: 1.6 mg/dL (ref 1.5–2.5)

## 2014-05-16 LAB — PHOSPHORUS: PHOSPHORUS: 1.4 mg/dL — AB (ref 2.3–4.6)

## 2014-05-16 LAB — POTASSIUM: Potassium: 2.6 mEq/L — CL (ref 3.7–5.3)

## 2014-05-16 MED ORDER — AMIODARONE HCL IN DEXTROSE 360-4.14 MG/200ML-% IV SOLN
30.0000 mg/h | INTRAVENOUS | Status: DC
  Administered 2014-05-16 – 2014-05-17 (×3): 30 mg/h via INTRAVENOUS
  Filled 2014-05-16 (×3): qty 200

## 2014-05-16 MED ORDER — DIGOXIN 0.25 MG/ML IJ SOLN
0.1250 mg | Freq: Once | INTRAMUSCULAR | Status: AC
  Administered 2014-05-16: 0.125 mg via INTRAVENOUS
  Filled 2014-05-16: qty 0.5

## 2014-05-16 MED ORDER — SODIUM CHLORIDE 0.9 % IJ SOLN
10.0000 mL | INTRAMUSCULAR | Status: DC | PRN
  Administered 2014-05-18 – 2014-05-19 (×2): 10 mL

## 2014-05-16 MED ORDER — POTASSIUM CHLORIDE 10 MEQ/100ML IV SOLN
10.0000 meq | INTRAVENOUS | Status: AC
  Administered 2014-05-16 (×2): 10 meq via INTRAVENOUS
  Filled 2014-05-16 (×2): qty 100

## 2014-05-16 MED ORDER — SODIUM CHLORIDE 0.9 % IV BOLUS (SEPSIS): 500.0000 mL | Freq: Once | INTRAVENOUS | Status: DC

## 2014-05-16 MED ORDER — POTASSIUM CHLORIDE 10 MEQ/100ML IV SOLN
10.0000 meq | INTRAVENOUS | Status: AC
  Administered 2014-05-16 – 2014-05-17 (×5): 10 meq via INTRAVENOUS
  Filled 2014-05-16 (×5): qty 100

## 2014-05-16 MED ORDER — SODIUM CHLORIDE 0.9 % IV BOLUS (SEPSIS)
500.0000 mL | Freq: Once | INTRAVENOUS | Status: AC
  Administered 2014-05-16: 03:00:00 via INTRAVENOUS

## 2014-05-16 MED ORDER — AMIODARONE HCL IN DEXTROSE 360-4.14 MG/200ML-% IV SOLN
60.0000 mg/h | INTRAVENOUS | Status: AC
  Administered 2014-05-16: 60 mg/h via INTRAVENOUS

## 2014-05-16 MED ORDER — SODIUM CHLORIDE 0.9 % IJ SOLN
10.0000 mL | Freq: Two times a day (BID) | INTRAMUSCULAR | Status: DC
  Administered 2014-05-16 – 2014-05-17 (×2): 10 mL

## 2014-05-16 MED ORDER — SODIUM CHLORIDE 0.9 % IV SOLN
1.0000 g | Freq: Once | INTRAVENOUS | Status: AC
  Administered 2014-05-16: 1 g via INTRAVENOUS
  Filled 2014-05-16: qty 10

## 2014-05-16 MED ORDER — BOOST / RESOURCE BREEZE PO LIQD
1.0000 | Freq: Two times a day (BID) | ORAL | Status: DC
  Administered 2014-05-17 – 2014-05-19 (×4): 1 via ORAL

## 2014-05-16 NOTE — Progress Notes (Signed)
Patient ID: [name], female   DOB: October 04, 1925, 80 y.o.   MRN: [id number] 4 Days Post-Op  Subjective: Very alert. Seems comfortable. Complaining of some joint pain on questioning. Is hungry. Wants to go home.  Objective: Vital signs in last 24 hours: Temp:  [97.9 F (36.6 C)-99.1 F (37.3 C)] 98.3 F (36.8 C) (10/20 0400) Pulse Rate:  [26-168] 68 (10/20 0800) Resp:  [19-35] 21 (10/20 0800) BP: (61-153)/(32-100) 153/97 mmHg (10/20 0800) SpO2:  [80 %-100 %] 80 % (10/20 0800) FiO2 (%):  [35 %] 35 % (10/20 0600) Weight:  [149 lb 7.6 oz (67.8 kg)] 149 lb 7.6 oz (67.8 kg) (10/20 0400) Last BM Date: 05/15/14  Intake/Output from previous day: 10/19 0701 - 10/20 0700 In: 2507 [I.V.:887; NG/GT:60; IV Piggyback:1500] Out: 2826 [Urine:1175; Emesis/NG output:400; Stool:1251] Intake/Output this shift: Total I/O In: -  Out: 250 [Stool:250]  General appearance: alert, cooperative and no distress GI: normal findings: soft, non-tender and ostomy is functioning Incision/Wound: no evidence of infection  Lab Results:   Recent Labs  05/15/14 0345 05/16/14 0400  WBC 15.7* 11.9*  HGB 10.1* 9.5*  HCT 29.6* 27.8*  PLT 235 210   BMET  Recent Labs  05/15/14 0345 05/16/14 0400  NA 137 136*  K 3.4* 2.9*  CL 100 100  CO2 26 26  GLUCOSE 116* 105*  BUN 16 15  CREATININE 0.62 0.64  CALCIUM 7.5* 7.3*     Studies/Results: Dg Chest Port 1 View  05/15/2014   CLINICAL DATA:  Chronic respiratory failure. Altered level of consciousness. Shortness of breath. Subsequent encounter.  EXAM: PORTABLE CHEST - 1 VIEW  COMPARISON:  05/14/2014; 05/08/2014; 04/22/2014  FINDINGS: Grossly unchanged cardiac silhouette and mediastinal contours with atherosclerotic plaque within the thoracic aorta. Interval development of a small right-sided effusion with unchanged small to moderate size left-sided pleural effusion. Slight worsening of bibasilar opacities, left greater than right. Mild pulmonary venous  congestion without frank evidence of edema. No pneumothorax, though note, evaluation lung apices is excluded secondary to the patient's overlying chin. Regional soft tissues appear normal.  IMPRESSION: 1. Interval development of a small right-sided effusion with worsening bibasilar opacities, right greater than left, atelectasis versus infiltrate. 2. Pulmonary venous congestion without frank evidence of edema.   Electronically Signed   By: Simonne ComeJohn  Watts M.D.   On: 05/15/2014 11:32   Dg Chest Port 1 View  05/14/2014   CLINICAL DATA:  Acute respiratory failure.  EXAM: PORTABLE CHEST - 1 VIEW  COMPARISON:  05/08/2014.  FINDINGS: Stable enlarged cardiac silhouette. The previously noted large hiatal hernia appears smaller. Interval small to moderate-sized left pleural effusion and small right pleural effusion. Increased prominence of the pulmonary vasculature and mild increase in prominence of the interstitial markings. Interval mild patchy opacity at both lung bases. Diffuse osteopenia. Nasogastric tube extending into the stomach.  IMPRESSION: 1. Interval mild changes of congestive heart failure. 2. Stable cardiomegaly. 3. Bibasilar patchy atelectasis or pneumonia. 4. Decreased size of the previously seen large hiatal hernia.   Electronically Signed   By: Gordan PaymentSteve  Reid M.D.   On: 05/14/2014 16:14    Anti-infectives: Anti-infectives   Start     Dose/Rate Route Frequency Ordered Stop   05/15/14 0600  vancomycin (VANCOCIN) 500 mg in sodium chloride 0.9 % 100 mL IVPB     500 mg 100 mL/hr over 60 Minutes Intravenous Every 12 hours 05/14/14 1602     05/14/14 1630  piperacillin-tazobactam (ZOSYN) IVPB 3.375 g  3.375 g 12.5 mL/hr over 240 Minutes Intravenous 3 times per day 05/14/14 1602     05/14/14 1630  vancomycin (VANCOCIN) IVPB 1000 mg/200 mL premix     1,000 mg 200 mL/hr over 60 Minutes Intravenous  Once 05/14/14 1602 05/14/14 1751   05/11/14 0600  cefoTEtan (CEFOTAN) 2 g in dextrose 5 % 50 mL IVPB   Status:  Discontinued     2 g 100 mL/hr over 30 Minutes Intravenous On call to O.R. 05/10/14 1512 05/12/14 1637   05/09/14 0215  cefTRIAXone (ROCEPHIN) 1 g in dextrose 5 % 50 mL IVPB  Status:  Discontinued     1 g 100 mL/hr over 30 Minutes Intravenous Daily at bedtime 05/09/14 0203 05/14/14 1536      Assessment/Plan: s/p Procedure(s): OPEN EXPLORATORY LAPAROTOMY, SIGMOID COLECTOMY, LYSIS OF ADHESIONS, SEROSAL REPAIR, COLOSTOMY Doing well and making good progress. Start clear liquid diet. IV fluids reduced as she has good urine output and stable vital signs. Continue IV antibiotics for now until WBC normalizes   LOS: 8 days    Dessiree Sze T 05/16/2014

## 2014-05-16 NOTE — Progress Notes (Signed)
Amiodarone Drug - Drug Interaction Consult Note  Recommendations: Would consider an alternative agent to PRN Haldol in patient being loaded with amiodarone.  QTc appears prolonged.  Concomitant use is expected to substantially increase the risk for torsades, especially in this patient with other risk factors (older age, female sex, hypokalemia).  Note only 3 runs of KCl have been ordered for this patient with K+ 2.9 this AM.   Amiodarone is metabolized by the cytochrome P450 system and therefore has the potential to cause many drug interactions. Amiodarone has an average plasma half-life of 50 days (range 20 to 100 days).    There is potential for drug interactions to occur several weeks or months after stopping treatment and the onset of drug interactions may be slow after initiating amiodarone.   []  Statins: Increased risk of myopathy. Simvastatin- restrict dose to 20mg  daily. Other statins: counsel patients to report any muscle pain or weakness immediately.  []  Anticoagulants: Amiodarone can increase anticoagulant effect. Consider warfarin dose reduction. Patients should be monitored closely and the dose of anticoagulant altered accordingly, remembering that amiodarone levels take several weeks to stabilize.  []  Antiepileptics: Amiodarone can increase plasma concentration of phenytoin, the dose should be reduced. Note that small changes in phenytoin dose can result in large changes in levels. Monitor patient and counsel on signs of toxicity.  []  Beta blockers: increased risk of bradycardia, AV block and myocardial depression. Sotalol - avoid concomitant use.  []   Calcium channel blockers (diltiazem and verapamil): increased risk of bradycardia, AV block and myocardial depression.  []   Cyclosporine: Amiodarone increases levels of cyclosporine. Reduced dose of cyclosporine is recommended.  []  Digoxin dose should be halved when amiodarone is started.  []  Diuretics: increased risk of  cardiotoxicity if hypokalemia occurs.  []  Oral hypoglycemic agents (glyburide, glipizide, glimepiride): increased risk of hypoglycemia. Patient's glucose levels should be monitored closely when initiating amiodarone therapy.   [x]  Drugs that prolong the QT interval:  Torsades de pointes risk may be increased with concurrent use - avoid if possible.  Monitor QTc, also keep magnesium/potassium WNL if concurrent therapy can't be avoided. Marland Kitchen. Antibiotics: e.g. fluoroquinolones, erythromycin. . Antiarrhythmics: e.g. quinidine, procainamide, disopyramide, sotalol. . Antipsychotics: e.g. phenothiazines, haloperidol**  . Lithium, tricyclic antidepressants, and methadone.   Thank You,  Clance BollRunyon, Sowmya Partridge  05/16/2014 10:14 AM

## 2014-05-16 NOTE — Progress Notes (Addendum)
INITIAL NUTRITION ASSESSMENT  DOCUMENTATION CODES Per approved criteria  Moderate (non-severe)malnutrition in context of acute illness/injury  Pt meets criteria for moderate MALNUTRITION in the context of acute illness/injury as evidenced by PO intake < 75% for 7 days, moderate muscle wasting in clavicle and acromion regions.   INTERVENTION: -Diet advancement per MD -Recommend Resource Breeze po BID, each supplement provides 250 kcal and 9 grams of protein -Recommend repletion of Phosphorus if no improvement after calcium levels replaced -RD to continue to monitor  NUTRITION DIAGNOSIS: Inadequate oral intake related to inability to eat as evidenced by NPO status.   Goal: Pt to meet >/= 90% of their estimated nutrition needs    Monitor:  Diet order, total protein/energy intake, labs, weights  Reason for Assessment: MST  80 y.o. female  Admitting Dx: Stricture of sigmoid colon  ASSESSMENT: [name] is a 80 y.o. female [facility] with Swelling in her legs. Ongoing for a couple months but acutely worsened over the last couple of days. Associated w/ mild pain. Denies SOB, CP, Nausea, Vomiting, abd pain, fever, palpitations. Belly is at baseline very hard  -Pt denied any changes in weight or appetite pta. Diet recall indicated pt consume two meals daily, typically breakfast and dinner. Consumed variety of foods and denied any difficulty swallowing/chewing  -Reported weight to have remained stable. Current weight may be elevated d/t +2 bilateral LE edema. Unable to determine a usual body weight -MD noted pt with daily ETOH use of two Scotch drinks/night -Pt has been NPO for past 5 days d/t colon obstruction.  -Underwent sigmoid colectomy and colostomy placement on 10/16. WOC following for ostomy care. Has 250 ml output -NGT removed on 10/19 -Per discussion with surgery, plan to advance diet today to clear liquid. This was confirmed by NT -Moderate muscle wasting   Noted in clavicle and acromion bone region -K/Phos Low-MD plan to replete Ca levels and then re-assess phosphorus levels  Height: Ht Readings from Last 1 Encounters:  05/12/14 5\' 2"  (1.575 m)    Weight: Wt Readings from Last 1 Encounters:  05/16/14 149 lb 7.6 oz (67.8 kg)    Ideal Body Weight: 110 lb  % Ideal Body Weight: 135%  Wt Readings from Last 10 Encounters:  05/16/14 149 lb 7.6 oz (67.8 kg)  05/16/14 149 lb 7.6 oz (67.8 kg)  09/22/12 146 lb 9.7 oz (66.5 kg)  07/01/11 139 lb 15.9 oz (63.5 kg)    Usual Body Weight: unable to determine  % Usual Body Weight: unable to determine  BMI:  Body mass index is 27.33 kg/(m^2).  Estimated Nutritional Needs: Kcal: 1500-1700 Protein: 75-85 gram Fluid: >/= 1500 ml daily  Skin: Colostomy, +2 RLE and LLE edema, non pitting RUE and LUE edema  Diet Order: Clear Liquid  EDUCATION NEEDS: -No education needs identified at this time   Intake/Output Summary (Last 24 hours) at 05/16/14 1308 Last data filed at 05/16/14 1200  Gross per 24 hour  Intake 2469.9 ml  Output   1876 ml  Net  593.9 ml    Last BM: 10/19   Labs:   Recent Labs Lab 05/14/14 0705 05/15/14 0345 05/16/14 0400  NA 137 137 136*  K 3.1* 3.4* 2.9*  CL 99 100 100  CO2 23 26 26   BUN 23 16 15   CREATININE 0.95 0.62 0.64  CALCIUM 7.8* 7.5* 7.3*  MG 1.5  --  1.6  PHOS 2.8  --  1.4*  GLUCOSE 114* 116* 105*  CBG (last 3)  No results found for this basename: GLUCAP,  in the last 72 hours  Scheduled Meds: . antiseptic oral rinse  7 mL Mouth Rinse q12n4p  . antiseptic oral rinse  7 mL Mouth Rinse BID  . chlorhexidine  15 mL Mouth Rinse BID  . heparin  5,000 Units Subcutaneous 3 times per day  . Influenza vac split quadrivalent PF  0.5 mL Intramuscular Tomorrow-1000  . lactated ringers  1,000 mL Intravenous Once  . levothyroxine  60 mcg Intravenous Daily  . lip balm  1 application Topical BID  . piperacillin-tazobactam (ZOSYN)  IV  3.375 g  Intravenous 3 times per day  . sodium chloride  500 mL Intravenous Once  . vancomycin  500 mg Intravenous Q12H    Continuous Infusions: . sodium chloride 75 mL/hr (05/16/14 1150)  . amiodarone 30 mg/hr (05/16/14 1151)  . bupivacaine 0.25 % ON-Q pump DUAL CATH 300 mL      Past Medical History  Diagnosis Date  . Arthritis   . Fibromyalgia   . Diverticulosis   . Hypothyroidism   . Osteoarthritis   . DJD (degenerative joint disease)   . Diverticulitis 07/01/2011  . Diverticulosis of sigmoid colon 05/09/2014    Past Surgical History  Procedure Laterality Date  . Ectopic pregnancy surgery    . Abdominal hysterectomy    . Abdominal adhesion surgery    . Knee arthroplasty    . Colonoscopy  2011    [name]  . Laparotomy N/A 05/12/2014    Procedure: OPEN EXPLORATORY LAPAROTOMY, SIGMOID COLECTOMY, LYSIS OF ADHESIONS, SEROSAL REPAIR, COLOSTOMY;  Surgeon: [name], MD;  Location: [facility];  Service: General;  Laterality: N/A;    Lloyd HugerSarah F Quincie Haroon MS RD LDN Clinical Dietitian Pager:[phone]

## 2014-05-16 NOTE — Progress Notes (Signed)
Peripherally Inserted Central Catheter/Midline Placement  The IV Nurse has discussed with the patient and/or persons authorized to consent for the patient, the purpose of this procedure and the potential benefits and risks involved with this procedure.  The benefits include less needle sticks, lab draws from the catheter and patient may be discharged home with the catheter.  Risks include, but not limited to, infection, bleeding, blood clot (thrombus formation), and puncture of an artery; nerve damage and irregular heat beat.  Alternatives to this procedure were also discussed.  PICC/Midline Placement Documentation  PICC / Midline Double Lumen 05/16/14 PICC Right Basilic 34 cm 0 cm (Active)       [name], Lajean ManesKerry Loraine 05/16/2014, 3:19 PM

## 2014-05-16 NOTE — Progress Notes (Signed)
[facility] PROGRESS NOTE  [name] JXB:[id number] DOB: [birth year] DOA: 05/08/2014 PCP:  Duane Lopeoss, Alan, MD  Brief Narrative: Patient is an 80 year old who presented with chief complaint of lower extremity swelling but on further evaluation CT scan showed evidence of significant dilatation of both the small bowel and large bowel with tapering and distal decompression beginning in the sigmoid region which raise the question of possible mass or stricture in that area. General surgery was consulted and patient was taken to the OR for lysis of adhesions, sigmoid colectomy with end colostomy, serosal repair, and repair of supraumbilical ventral.   Assessment/Plan:  Principal Problem:   Hypotension: new problem - Patient has had recurrent problems with this associated with paroxysmal atrial fibrillation. As such will continue to monitor in ICU.  - PCCM assisted and patient required phenylephrine during her hospital stay.  - Patient is DO NOT RESUSCITATE but per my discussion with palliative care medical team health care power of attorney would like for patient to receive pressors or treat reversible causes.  It is my understanding however the we're not to perform chest compressions or intubation should patient's condition deteriorate.  Atrial fibrillation: New problem - Consulted cardiology for further recommendations. - Patient currently on amiodarone drip - potassium currently being replaced - Pt is s/p magnesium infusion. Will reassess mg levels next am.  Hypokalemia -Agree with potassium replacement  -Reassess potassium levels next am  Hypocalcemia - Replace IV with calcium gluconate - Reassess next a.m.  Hypophosphatemia - Will replace calcium levels first as patient cannot take oral medications for replacement. And based on the literature IV phosphorus replacement can bind with calcium making calcium blood levels lower. - Continue to monitor and treat after improvement in  calcium levels   Colon obstruction - Pt is s/p date 3: lysis of adhesions, sigmoid colectomy with end colostomy, serosal repair, repair of supraumbilical ventral hernia - Pain control per general surgery - GI signed off - General surgery on board.  History of alcohol use - Patient reportedly drinks every night prior to admission -  Complicated in that patient cannot take any oral medication - Reportedly patient has adverse reactions to Ativan - Will continue to monitor  Active Problems:    Fibromyalgia - Stable on current medical management  Hypothyroidism - Continue IV synthroid - Pt still npo and has not received her synthroid as a result.    HOH (hard of hearing) - Stable no new complaints    Dementia - Continue to monitor, no need for restraints at this juncture  Urinary tract infection - antibiotic coverage recently increased due to leukocytosis and tachycardia  Code Status: DO NOT RESUSCITATE Family Communication: None at bedside  Disposition Plan: Per general surgery, currently in ICU   Consultants:  *General surgery  GI>>>05/11/14  Procedures:  Pending  Antibiotics:  Vanc and Zosyn  HPI/Subjective: Patient was inquiring about discharge today. I explained that she was too ill to go home. Patient did not seem to understand her medical status despite explanation of her current condition  Objective: Filed Vitals:   05/16/14 0800  BP: 153/97  Pulse: 68  Temp:   Resp: 21    Intake/Output Summary (Last 24 hours) at 05/16/14 1045 Last data filed at 05/16/14 0830  Gross per 24 hour  Intake   2250 ml  Output   1876 ml  Net    374 ml   Filed Weights   05/14/14 0400 05/15/14 0500 05/16/14 0400  Weight: 66.3 kg (146  lb 2.6 oz) 58.5 kg (128 lb 15.5 oz) 67.8 kg (149 lb 7.6 oz)    Exam:   General:  Pt alert and awake, in nad  Cardiovascular: Regular rate and rhythm  Respiratory: No audible wheezes, Pt on supplemental oxygen, no increased  wob  Abdomen: incision intact with no active bleeding  Data Reviewed: Basic Metabolic Panel:  Recent Labs Lab 05/11/14 0420 05/12/14 1806 05/13/14 2029 05/14/14 0705 05/15/14 0345 05/16/14 0400  NA 136* 138  --  137 137 136*  K 3.5* 2.6* 3.3* 3.1* 3.4* 2.9*  CL 95* 95*  --  99 100 100  CO2 24 19  --  23 26 26   GLUCOSE 66* 89  --  114* 116* 105*  BUN 30* 19  --  23 16 15   CREATININE 0.79 0.71  --  0.95 0.62 0.64  CALCIUM 8.6 7.6*  --  7.8* 7.5* 7.3*  MG  --   --   --  1.5  --  1.6  PHOS  --   --   --  2.8  --  1.4*   Liver Function Tests: No results found for this basename: AST, ALT, ALKPHOS, BILITOT, PROT, ALBUMIN,  in the last 168 hours No results found for this basename: LIPASE, AMYLASE,  in the last 168 hours No results found for this basename: AMMONIA,  in the last 168 hours CBC:  Recent Labs Lab 05/10/14 0455 05/11/14 0420 05/14/14 1010 05/15/14 0345 05/16/14 0400  WBC 6.1 5.1 16.2* 15.7* 11.9*  HGB 13.1 13.3 9.4* 10.1* 9.5*  HCT 39.1 39.8 27.4* 29.6* 27.8*  MCV 87.9 88.4 88.4 87.6 87.7  PLT 405* 356 229 235 210   Cardiac Enzymes: No results found for this basename: CKTOTAL, CKMB, CKMBINDEX, TROPONINI,  in the last 168 hours BNP (last 3 results)  Recent Labs  03/22/14 1612 03/22/14 1628 05/08/14 1615  PROBNP 901.5* 893.5* 1099.0*   CBG: No results found for this basename: GLUCAP,  in the last 168 hours  Recent Results (from the past 240 hour(s))  URINE CULTURE     Status: None   Collection Time    05/08/14 10:58 PM      Result Value Ref Range Status   Specimen Description URINE, RANDOM   Final   Special Requests NONE   Final   Culture  Setup Time     Final   Value: 05/09/2014 08:19     Performed at [facility]   Colony Count     Final   Value: >=100,000 COLONIES/ML     Performed at [facility]   Culture     Final   Value: PROTEUS MIRABILIS     Performed at [facility]   Report Status 05/13/2014 FINAL   Final    Organism ID, Bacteria PROTEUS MIRABILIS   Final  SURGICAL PCR SCREEN     Status: None   Collection Time    05/10/14  6:42 PM      Result Value Ref Range Status   MRSA, PCR NEGATIVE  NEGATIVE Final   Staphylococcus aureus NEGATIVE  NEGATIVE Final   Comment:            The Xpert SA Assay (FDA     approved for NASAL specimens     in patients over 78 years of age),     is one component of     a comprehensive surveillance     program.  Test performance has  been validated by [facility] for patients greater     than or equal to 80 year old.     It is not intended     to diagnose infection nor to     guide or monitor treatment.  CULTURE, BLOOD (ROUTINE X 2)     Status: None   Collection Time    05/14/14  7:05 AM      Result Value Ref Range Status   Specimen Description BLOOD LEFT ARM   Final   Special Requests     Final   Value: BOTTLES DRAWN AEROBIC AND ANAEROBIC 10CC BLUE BOTTLE, 5CC RED BOTTLE   Culture  Setup Time     Final   Value: 05/14/2014 15:18     Performed at [facility]   Culture     Final   Value:        BLOOD CULTURE RECEIVED NO GROWTH TO DATE CULTURE WILL BE HELD FOR 5 DAYS BEFORE ISSUING A FINAL NEGATIVE REPORT     Note: Culture results may be compromised due to an excessive volume of blood received in culture bottles.     Performed at [facility]   Report Status PENDING   Incomplete  CULTURE, BLOOD (ROUTINE X 2)     Status: None   Collection Time    05/14/14 10:11 AM      Result Value Ref Range Status   Specimen Description BLOOD LEFT ARM   Final   Special Requests BOTTLES DRAWN AEROBIC ONLY 3CC BLUE BOTTLE   Final   Culture  Setup Time     Final   Value: 05/14/2014 18:50     Performed at [facility]   Culture     Final   Value:        BLOOD CULTURE RECEIVED NO GROWTH TO DATE CULTURE WILL BE HELD FOR 5 DAYS BEFORE ISSUING A FINAL NEGATIVE REPORT     Performed at [facility]   Report Status PENDING   Incomplete      Studies: Dg Chest Port 1 View  05/15/2014   CLINICAL DATA:  Chronic respiratory failure. Altered level of consciousness. Shortness of breath. Subsequent encounter.  EXAM: PORTABLE CHEST - 1 VIEW  COMPARISON:  05/14/2014; 05/08/2014; 04/22/2014  FINDINGS: Grossly unchanged cardiac silhouette and mediastinal contours with atherosclerotic plaque within the thoracic aorta. Interval development of a small right-sided effusion with unchanged small to moderate size left-sided pleural effusion. Slight worsening of bibasilar opacities, left greater than right. Mild pulmonary venous congestion without frank evidence of edema. No pneumothorax, though note, evaluation lung apices is excluded secondary to the patient's overlying chin. Regional soft tissues appear normal.  IMPRESSION: 1. Interval development of a small right-sided effusion with worsening bibasilar opacities, right greater than left, atelectasis versus infiltrate. 2. Pulmonary venous congestion without frank evidence of edema.   Electronically Signed   By: Simonne Come M.D.   On: 05/15/2014 11:32   Dg Chest Port 1 View  05/14/2014   CLINICAL DATA:  Acute respiratory failure.  EXAM: PORTABLE CHEST - 1 VIEW  COMPARISON:  05/08/2014.  FINDINGS: Stable enlarged cardiac silhouette. The previously noted large hiatal hernia appears smaller. Interval small to moderate-sized left pleural effusion and small right pleural effusion. Increased prominence of the pulmonary vasculature and mild increase in prominence of the interstitial markings. Interval mild patchy opacity at both lung bases. Diffuse osteopenia. Nasogastric tube extending into the stomach.  IMPRESSION: 1. Interval mild  changes of congestive heart failure. 2. Stable cardiomegaly. 3. Bibasilar patchy atelectasis or pneumonia. 4. Decreased size of the previously seen large hiatal hernia.   Electronically Signed   By: Gordan Payment M.D.   On: 05/14/2014 16:14    Scheduled Meds: . antiseptic oral rinse   7 mL Mouth Rinse q12n4p  . antiseptic oral rinse  7 mL Mouth Rinse BID  . chlorhexidine  15 mL Mouth Rinse BID  . heparin  5,000 Units Subcutaneous 3 times per day  . Influenza vac split quadrivalent PF  0.5 mL Intramuscular Tomorrow-1000  . lactated ringers  1,000 mL Intravenous Once  . levothyroxine  60 mcg Intravenous Daily  . lip balm  1 application Topical BID  . piperacillin-tazobactam (ZOSYN)  IV  3.375 g Intravenous 3 times per day  . sodium chloride  500 mL Intravenous Once  . vancomycin  500 mg Intravenous Q12H   Continuous Infusions: . sodium chloride 150 mL/hr at 05/15/14 2235  . amiodarone 60 mg/hr (05/16/14 0600)  . amiodarone    . bupivacaine 0.25 % ON-Q pump DUAL CATH 300 mL      Time spent: > 35 minutes     Penny Pia  [facility] Pager [phone] If 7PM-7AM, please contact night-coverage at [email or web address], password [facility] 05/16/2014, 10:45 AM  LOS: 8 days

## 2014-05-16 NOTE — Progress Notes (Signed)
CRITICAL VALUE ALERT  Critical value received:  Potassium 2.6  Date of notification: 05/16/14  Time of notification:  1938  Critical value read back:Yes.    Nurse who received alert:  Effie BerkshireAlex Nikola Marone  MD notified (1st page):  [facility] NP  Time of first page:  1942  MD notified (2nd page):  Time of second page:  Responding MD:  [facility] NP  Time MD responded:  2003

## 2014-05-16 NOTE — Progress Notes (Signed)
Took over care at 0230, pt's HR 130-160s A-fib, pt confused and agitated attempting to get out of bed, disoriented to place and time.  BP also dropped to mid 70's systolic by automatic cuff, manual reading was approx. the same.  Veronia BeetsK Shorr, NP notified of above.  Orders receieved to repeat Haldol 5mg  IV and give 600 NS bolus which has been done.  I will wait for bolus to go in then follow up with NP.  Ky BarbanHans Janica Eldred,RN,CCRN

## 2014-05-16 NOTE — Consult Note (Signed)
Admit date: 05/08/2014 Referring Physician  Dr. Cena Benton Primary Physician  Duane Lope, MD Primary Cardiologist  None Reason for Consultation  Post op AFIB  HPI: 80 year old female status post lysis of adhesions, sigmoid colectomy, dementia, transient postoperative hypotension with postoperative atrial fibrillation, paroxysmal. Immediately postop she was noted to be in atrial fibrillation with rapid ventricular response but converted to sinus rhythm with potassium being repleted and magnesium being repleted. Later that evening on October 19, she developed atrial fibrillation once again with rapid ventricular response. Metoprolol x1 for rate control was utilized. Heart rate at the time was 160-170 with blood pressure 104/67. She was quite confused, agitated attempting to get out of bed, disoriented, Haldol given, blood pressure decreased to 70s. 600 mL bolus was administered at 3 AM. Recent TSH on 05/14/14 was 3.9.   Currently in normal sinus rhythm. She states that she wants to go home. Denies CP, SOB, palpitations.       PMH:   Past Medical History  Diagnosis Date  . Arthritis   . Fibromyalgia   . Diverticulosis   . Hypothyroidism   . Osteoarthritis   . DJD (degenerative joint disease)   . Diverticulitis 07/01/2011  . Diverticulosis of sigmoid colon 05/09/2014    PSH:   Past Surgical History  Procedure Laterality Date  . Ectopic pregnancy surgery    . Abdominal hysterectomy    . Abdominal adhesion surgery    . Knee arthroplasty    . Colonoscopy  2011    Dr Randa Evens, [facility]  . Laparotomy N/A 05/12/2014    Procedure: OPEN EXPLORATORY LAPAROTOMY, SIGMOID COLECTOMY, LYSIS OF ADHESIONS, SEROSAL REPAIR, COLOSTOMY;  Surgeon: Karie Soda, MD;  Location: [facility];  Service: General;  Laterality: N/A;   Allergies:  Antihistamines, chlorpheniramine-type and Codeine Prior to Admit Meds:   Prior to Admission medications   Medication Sig Start Date End Date Taking? Authorizing Provider    levothyroxine (SYNTHROID, LEVOTHROID) 100 MCG tablet Take 100 mcg by mouth. Take 100 mcg on Monday and Friday.  Take 112 mcg all other days.   Yes Historical Provider, MD  levothyroxine (SYNTHROID, LEVOTHROID) 112 MCG tablet Take 112 mcg by mouth. Take 112 mcg on Tuesday, Wednesday, Thursday, Saturday and Sunday.Take 100 mcg on other days.   Yes Historical Provider, MD  loperamide (IMODIUM A-D) 2 MG tablet Take 2 mg by mouth 4 (four) times daily as needed for diarrhea or loose stools.   Yes Historical Provider, MD  nabumetone (RELAFEN) 750 MG tablet Take 750 mg by mouth as needed for mild pain (as directed.).    Yes Historical Provider, MD  polyethylene glycol (MIRALAX / GLYCOLAX) packet Take 17 g by mouth daily as needed for mild constipation.    Yes Historical Provider, MD  traMADol (ULTRAM) 50 MG tablet Take 50-100 mg by mouth. Maximum dose= 8 tablets per day   Yes Historical Provider, MD  traMADol (ULTRAM) 50 MG tablet Take 1-2 tablets (50-100 mg total) by mouth every 6 (six) hours as needed for moderate pain. 05/12/14   Karie Soda, MD    Current MEDS:  Scheduled Meds: . antiseptic oral rinse  7 mL Mouth Rinse q12n4p  . antiseptic oral rinse  7 mL Mouth Rinse BID  . chlorhexidine  15 mL Mouth Rinse BID  . heparin  5,000 Units Subcutaneous 3 times per day  . Influenza vac split quadrivalent PF  0.5 mL Intramuscular Tomorrow-1000  . lactated ringers  1,000 mL Intravenous Once  . levothyroxine  60 mcg Intravenous Daily  . lip balm  1 application Topical BID  . piperacillin-tazobactam (ZOSYN)  IV  3.375 g Intravenous 3 times per day  . sodium chloride  500 mL Intravenous Once  . vancomycin  500 mg Intravenous Q12H   Continuous Infusions: . sodium chloride 150 mL/hr at 05/15/14 2235  . amiodarone 60 mg/hr (05/16/14 0600)  . amiodarone    . bupivacaine 0.25 % ON-Q pump DUAL CATH 300 mL     PRN Meds:.acetaminophen, acetaminophen, alum & mag hydroxide-simeth, diphenhydrAMINE,  haloperidol lactate, magic mouthwash, menthol-cetylpyridinium, ondansetron (ZOFRAN) IV, ondansetron (ZOFRAN) IV, phenol, promethazine  Fam HX:    Family History  Problem Relation Age of Onset  . Prostate cancer     Social HX:    History   Social History  . Marital Status: Widowed    Spouse Name: N/A    Number of Children: N/A  . Years of Education: N/A   Occupational History  . Not on file.   Social History Main Topics  . Smoking status: Former Smoker    Types: Cigarettes    Quit date: 06/30/1984  . Smokeless tobacco: Never Used  . Alcohol Use: Yes  . Drug Use: No  . Sexual Activity: No   Other Topics Concern  . Not on file   Social History Narrative  . No narrative on file     ROS:  Difficult to fully obtain with baseline dementia. All 11 ROS were addressed and are negative except what is stated in the HPI   Physical Exam: Blood pressure 153/97, pulse 68, temperature 98.3 F (36.8 C), temperature source Axillary, resp. rate 21, height 5\' 2"  (1.575 m), weight 149 lb 7.6 oz (67.8 kg), SpO2 80.00%.   General: Elderly mildly confused but pleasant.  Head: Eyes PERRLA, No xanthomas.   Normal cephalic and atramatic  Lungs:   Clear bilaterally to auscultation and percussion. Normal respiratory effort. No wheezes, no rales. Heart:  RRR, normal rate Pulses are 2+ & equal. Soft systolic apical murmur, rubs, gallops.  No carotid bruit. No JVD.  No abdominal bruits.  Abdomen: Postop changes noted Msk:  Back normal. Normal strength and tone for age. Extremities:  No clubbing, cyanosis or edema.  DP +1 Neuro: Confused non-focal, MAE x 4 GU: Deferred Rectal: Deferred Psych:  Good affect, responds appropriately      Labs: Lab Results  Component Value Date   WBC 11.9* 05/16/2014   HGB 9.5* 05/16/2014   HCT 27.8* 05/16/2014   MCV 87.7 05/16/2014   PLT 210 05/16/2014     Recent Labs Lab 05/16/14 0400  NA 136*  K 2.9*  CL 100  CO2 26  BUN 15  CREATININE 0.64   CALCIUM 7.3*  GLUCOSE 105*      Radiology:  Dg Chest Port 1 View  05/15/2014   CLINICAL DATA:  Chronic respiratory failure. Altered level of consciousness. Shortness of breath. Subsequent encounter.  EXAM: PORTABLE CHEST - 1 VIEW  COMPARISON:  05/14/2014; 05/08/2014; 04/22/2014  FINDINGS: Grossly unchanged cardiac silhouette and mediastinal contours with atherosclerotic plaque within the thoracic aorta. Interval development of a small right-sided effusion with unchanged small to moderate size left-sided pleural effusion. Slight worsening of bibasilar opacities, left greater than right. Mild pulmonary venous congestion without frank evidence of edema. No pneumothorax, though note, evaluation lung apices is excluded secondary to the patient's overlying chin. Regional soft tissues appear normal.  IMPRESSION: 1. Interval development of a small right-sided effusion with worsening bibasilar opacities,  right greater than left, atelectasis versus infiltrate. 2. Pulmonary venous congestion without frank evidence of edema.   Electronically Signed   By: Simonne ComeJohn  Watts M.D.   On: 05/15/2014 11:32   Dg Chest Port 1 View  05/14/2014   CLINICAL DATA:  Acute respiratory failure.  EXAM: PORTABLE CHEST - 1 VIEW  COMPARISON:  05/08/2014.  FINDINGS: Stable enlarged cardiac silhouette. The previously noted large hiatal hernia appears smaller. Interval small to moderate-sized left pleural effusion and small right pleural effusion. Increased prominence of the pulmonary vasculature and mild increase in prominence of the interstitial markings. Interval mild patchy opacity at both lung bases. Diffuse osteopenia. Nasogastric tube extending into the stomach.  IMPRESSION: 1. Interval mild changes of congestive heart failure. 2. Stable cardiomegaly. 3. Bibasilar patchy atelectasis or pneumonia. 4. Decreased size of the previously seen large hiatal hernia.   Electronically Signed   By: Gordan PaymentSteve  Reid M.D.   On: 05/14/2014 16:14    Personally viewed.  EKG:   05/15/14 at 1247-atrial fibrillation with rapid ventricular response heart rate 170 beats per minute with nonspecific ST-T wave changes. Prior EKG from 05/08/14 demonstrates sinus rhythm with nonspecific ST-T changes. Personally viewed.   Echocardiogram 09/23/12-ejection fraction 50-55%, moderately dilated left atrium, mild elevated pulmonary pressure 38 mm mercury.  ASSESSMENT/PLAN:    80 year old female status post sigmoid colectomy with postoperative atrial fibrillation with rapid ventricular response, hypokalemia, hypotension, dementia with acute delirium.  1. Postoperative atrial fibrillation- I agree with amiodarone infusion. Currently in normal sinus rhythm. Excellent.  Blood pressure has increased enough to trial either diltiazem or metoprolol once again if necessary for further rate control. Earlier in the morning, blood pressure was quite low. Prior EF normal. TSH normal. Keeping KCL >4 will help as well.   Once able, would change to PO amiodarone 200mg  po BID for 2 more weeks then 200mg  PO QD thereafter. If she remains in NSR over the next month, would consider discontinuing amiodarone at that time. Hopefully as adrenergic post op state decreases, we will see less and less afib. She would not be a good anticoagulation candidate. Underlying dementia.   2. Hypokalemia - replete per primary team  3. Dementia - baseline with worsening delirium at times. Reviewed Palliative care note.   DNR   Will follow with you.   Donato SchultzSKAINS, Huston Stonehocker, MD  05/16/2014  10:07 AM

## 2014-05-17 DIAGNOSIS — I4891 Unspecified atrial fibrillation: Secondary | ICD-10-CM | POA: Diagnosis not present

## 2014-05-17 LAB — BASIC METABOLIC PANEL
Anion gap: 15 (ref 5–15)
BUN: 10 mg/dL (ref 6–23)
CALCIUM: 7.9 mg/dL — AB (ref 8.4–10.5)
CO2: 26 mEq/L (ref 19–32)
Chloride: 101 mEq/L (ref 96–112)
Creatinine, Ser: 0.51 mg/dL (ref 0.50–1.10)
GFR calc Af Amer: >90 mL/min (ref >90)
GFR, EST NON AFRICAN AMERICAN: 85 mL/min — AB (ref >90)
GLUCOSE: 104 mg/dL — AB (ref 70–99)
Potassium: 2.9 mEq/L — CL (ref 3.7–5.3)
SODIUM: 142 meq/L (ref 137–147)

## 2014-05-17 LAB — MAGNESIUM: Magnesium: 1.6 mg/dL (ref 1.5–2.5)

## 2014-05-17 LAB — VANCOMYCIN, TROUGH: Vancomycin Tr: 12.4 ug/mL (ref 10.0–20.0)

## 2014-05-17 MED ORDER — MAGNESIUM SULFATE 40 MG/ML IJ SOLN
2.0000 g | Freq: Once | INTRAMUSCULAR | Status: AC
  Administered 2014-05-17: 2 g via INTRAVENOUS
  Filled 2014-05-17: qty 50

## 2014-05-17 MED ORDER — POTASSIUM CHLORIDE 10 MEQ/100ML IV SOLN
10.0000 meq | INTRAVENOUS | Status: AC
  Administered 2014-05-17 (×4): 10 meq via INTRAVENOUS
  Filled 2014-05-17 (×5): qty 100

## 2014-05-17 MED ORDER — LEVOTHYROXINE SODIUM 100 MCG PO TABS
100.0000 ug | ORAL_TABLET | ORAL | Status: DC
  Administered 2014-05-19: 100 ug via ORAL
  Filled 2014-05-17: qty 1

## 2014-05-17 MED ORDER — LEVOTHYROXINE SODIUM 112 MCG PO TABS
112.0000 ug | ORAL_TABLET | ORAL | Status: DC
  Administered 2014-05-17 – 2014-05-18 (×2): 112 ug via ORAL
  Filled 2014-05-17 (×3): qty 1

## 2014-05-17 MED ORDER — VANCOMYCIN HCL IN DEXTROSE 750-5 MG/150ML-% IV SOLN
750.0000 mg | Freq: Two times a day (BID) | INTRAVENOUS | Status: DC
  Administered 2014-05-17 – 2014-05-18 (×2): 750 mg via INTRAVENOUS
  Filled 2014-05-17 (×3): qty 150

## 2014-05-17 MED ORDER — POTASSIUM CHLORIDE 20 MEQ/15ML (10%) PO LIQD
40.0000 meq | Freq: Once | ORAL | Status: AC
  Administered 2014-05-17: 40 meq via ORAL
  Filled 2014-05-17: qty 30

## 2014-05-17 MED ORDER — AMIODARONE HCL 200 MG PO TABS
200.0000 mg | ORAL_TABLET | Freq: Two times a day (BID) | ORAL | Status: DC
  Administered 2014-05-17 – 2014-05-19 (×5): 200 mg via ORAL
  Filled 2014-05-17 (×5): qty 1

## 2014-05-17 NOTE — Progress Notes (Signed)
Gave report to allie rn on 4E 

## 2014-05-17 NOTE — Progress Notes (Signed)
CSW continuing to follow for disposition planning.  Pt transferred from [facility] [place] to Room 1419 today.   CSW spoke with pt HCPOA, Deetta Perla via telephone. PT HCPOA confirmed that she had received the SNF bed offers. CSW clarified pt HCPOA questions surrounding pt capacity and placement. Pt HCPOA discussed that she is hopeful that pt family members will lead in the decision making for pt regarding the SNF placement, but HCPOA is willing to assist given that she is pt HCPOA at this time. Pt HCPOA requested for this CSW to discuss SNF placement with pt family at bedside.  CSW met with pt and pt family members, Butch Penny and Estill Bamberg at bedside. Pt initially working with PT upon [place] arrival. CSW introduced self and explained role. CSW provided pt family list of SNF bed offers. CSW discussed with pt family options and clarified pt family's questions and concerns. Pt family plans to discuss with pt and pt HCPOA, Threasa Beards in order to make decision regarding placement.   CSW discussed with pt once PT finished treatment with pt. Pt less confused, but still displays short term memory deficits. Pt expressed understanding about need for rehab at SNF following hospitalization and aware that pt family plans to discuss with her the options this evening.   CSW provided report to 4E CSW, Raynaldo Opitz who will continue to follow to provide support and assist with pt discharge planning needs.   Alison Murray, MSW, [place] [facility] [phone]

## 2014-05-17 NOTE — Progress Notes (Addendum)
[facility] PROGRESS NOTE  [name] MWU:[id number] DOB: 1925-09-24 DOA: 05/08/2014 PCP:  Duane Lope, MD   Brief narrative 80 year old female presenting with colon obstruction and was taken to OR with lysis of adhesions, sigmoid colectomy with end colostomy with hospital course prolonged due to hypotension , delirium, respiratory failure and new onset A. fib and electrolyte imbalance.   Assessment/Plan: New onset A. fib with RVR Patient started on amiodarone drip and currently in sinus. Appreciate cardiology recommendations. Patient has now been transitioned to oral amiodarone 200 mg twice a day for 2 weeks followed by 200 mg once a day. Replenishing low potassium and magnesium..monitor closely -Check the echo given the onset A. fib, volume overload on chest x-ray and elevated proBNP  Hypotension Patient monitoring stepdown unit. Required IV phenylephrine. We'll resume home blood pressure medications.  Hypokalemia Replacing potassium with by mouth and IV KCl. Low  magnesium replenished as well. Recheck in am  Hypocalcemia and hypophosphatemia. Replenished  Colon obstruction # day 4 Status post lysis of adhesions, sigmoid colectomy with end colostomy, serosal repair, repair of supraumbilical ventral hernia. Tolerating diet. Surgery signed off. Recheck CBC in a.m. If normal will discontinue vancomycin and Zosyn (D4)  Dementia/depression and Alcohol abuse No signs of withdrawal. Pt will be counseled during hospital stay Seen by psych consult while in the hospital and do not recommend anything further  Hypothyroidism Continue home dose Synthroid. TSH normal  Diet: Full liquid  DVT prophylaxis  Code Status: DO NOT RESUSCITATE, goals of care discussion by palliative care with patient and family. Request to treat any treatable illness.  Family Communication: none at bedside Disposition Plan: Transfer to telemetry. SNF per  PT   Consultants:  Cardiology  [facility]  PCCM  Palliative care  [facility]    Procedures:  Laparotomy on 10/21  Antibiotics:  IV vancomycin and Zosyn day 4  HPI/Subjective: Patient seen and examined this morning. Denies any abdominal pain.  Objective: Filed Vitals:   05/17/14 1230  BP: 114/72  Pulse: 91  Temp: 97.5 F (36.4 C)  Resp: 22    Intake/Output Summary (Last 24 hours) at 05/17/14 1651 Last data filed at 05/17/14 1500  Gross per 24 hour  Intake 2283.8 ml  Output   2086 ml  Net  197.8 ml   Filed Weights   05/15/14 0500 05/16/14 0400 05/17/14 0400  Weight: 58.5 kg (128 lb 15.5 oz) 67.8 kg (149 lb 7.6 oz) 68.2 kg (150 lb 5.7 oz)    Exam:   General:  Elderly female in no acute distress  HEENT: No pallor, moist oral mucosa  Chest: Fine right-sided crackles  Cardiovascular: Normal S1 and S2, no murmurs  Abdomen: Soft, colostomy in place, nondistended, nontender, bowel sounds present, Foley in place  Musculoskeletal:  Trace edema bilaterally,  CNS: Alert and oriented x 2  Data Reviewed: Basic Metabolic Panel:  Recent Labs Lab 05/12/14 1806  05/14/14 0705 05/15/14 0345 05/16/14 0400 05/16/14 1400 05/17/14 0725  NA 138  --  137 137 136*  --  142  K 2.6*  < > 3.1* 3.4* 2.9* 2.6* 2.9*  CL 95*  --  99 100 100  --  101  CO2 19  --  23 26 26   --  26  GLUCOSE 89  --  114* 116* 105*  --  104*  BUN 19  --  23 16 15   --  10  CREATININE 0.71  --  0.95 0.62 0.64  --  0.51  CALCIUM 7.6*  --  7.8* 7.5* 7.3*  --  7.9*  MG  --   --  1.5  --  1.6  --  1.6  PHOS  --   --  2.8  --  1.4*  --   --   < > = values in this interval not displayed. Liver Function Tests: No results found for this basename: AST, ALT, ALKPHOS, BILITOT, PROT, ALBUMIN,  in the last 168 hours No results found for this basename: LIPASE, AMYLASE,  in the last 168 hours No results found for this basename: AMMONIA,  in the last 168 hours CBC:  Recent Labs Lab 05/11/14 0420  05/14/14 1010 05/15/14 0345 05/16/14 0400  WBC 5.1 16.2* 15.7* 11.9*  HGB 13.3 9.4* 10.1* 9.5*  HCT 39.8 27.4* 29.6* 27.8*  MCV 88.4 88.4 87.6 87.7  PLT 356 229 235 210   Cardiac Enzymes: No results found for this basename: CKTOTAL, CKMB, CKMBINDEX, TROPONINI,  in the last 168 hours BNP (last 3 results)  Recent Labs  03/22/14 1612 03/22/14 1628 05/08/14 1615  PROBNP 901.5* 893.5* 1099.0*   CBG: No results found for this basename: GLUCAP,  in the last 168 hours  Recent Results (from the past 240 hour(s))  URINE CULTURE     Status: None   Collection Time    05/08/14 10:58 PM      Result Value Ref Range Status   Specimen Description URINE, RANDOM   Final   Special Requests NONE   Final   Culture  Setup Time     Final   Value: 05/09/2014 08:19     Performed at [facility]   Colony Count     Final   Value: >=100,000 COLONIES/ML     Performed at [facility]   Culture     Final   Value: PROTEUS MIRABILIS     Performed at [facility]   Report Status 05/13/2014 FINAL   Final   Organism ID, Bacteria PROTEUS MIRABILIS   Final  SURGICAL PCR SCREEN     Status: None   Collection Time    05/10/14  6:42 PM      Result Value Ref Range Status   MRSA, PCR NEGATIVE  NEGATIVE Final   Staphylococcus aureus NEGATIVE  NEGATIVE Final   Comment:            The Xpert SA Assay (FDA     approved for NASAL specimens     in patients over 78 years of age),     is one component of     a comprehensive surveillance     program.  Test performance has     been validated by [facility] for patients greater     than or equal to 78 year old.     It is not intended     to diagnose infection nor to     guide or monitor treatment.  CULTURE, BLOOD (ROUTINE X 2)     Status: None   Collection Time    05/14/14  7:05 AM      Result Value Ref Range Status   Specimen Description BLOOD LEFT ARM   Final   Special Requests     Final   Value: BOTTLES DRAWN AEROBIC AND  ANAEROBIC 10CC BLUE BOTTLE, 5CC RED BOTTLE   Culture  Setup Time     Final   Value: 05/14/2014 15:18     Performed at [facility]   Culture  Final   Value:        BLOOD CULTURE RECEIVED NO GROWTH TO DATE CULTURE WILL BE HELD FOR 5 DAYS BEFORE ISSUING A FINAL NEGATIVE REPORT     Note: Culture results may be compromised due to an excessive volume of blood received in culture bottles.     Performed at [facility]   Report Status PENDING   Incomplete  CULTURE, BLOOD (ROUTINE X 2)     Status: None   Collection Time    05/14/14 10:11 AM      Result Value Ref Range Status   Specimen Description BLOOD LEFT ARM   Final   Special Requests BOTTLES DRAWN AEROBIC ONLY 3CC BLUE BOTTLE   Final   Culture  Setup Time     Final   Value: 05/14/2014 18:50     Performed at [facility]   Culture     Final   Value:        BLOOD CULTURE RECEIVED NO GROWTH TO DATE CULTURE WILL BE HELD FOR 5 DAYS BEFORE ISSUING A FINAL NEGATIVE REPORT     Performed at [facility]   Report Status PENDING   Incomplete     Studies: No results found.  Scheduled Meds: . amiodarone  200 mg Oral BID  . antiseptic oral rinse  7 mL Mouth Rinse q12n4p  . antiseptic oral rinse  7 mL Mouth Rinse BID  . chlorhexidine  15 mL Mouth Rinse BID  . feeding supplement (RESOURCE BREEZE)  1 Container Oral BID BM  . heparin  5,000 Units Subcutaneous 3 times per day  . Influenza vac split quadrivalent PF  0.5 mL Intramuscular Tomorrow-1000  . lactated ringers  1,000 mL Intravenous Once  . [START ON 05/19/2014] levothyroxine  100 mcg Oral Once per day on Mon Fri  . levothyroxine  112 mcg Oral Once per day on Sun Tue Wed Thu Sat  . lip balm  1 application Topical BID  . piperacillin-tazobactam (ZOSYN)  IV  3.375 g Intravenous 3 times per day  . sodium chloride  500 mL Intravenous Once  . sodium chloride  10-40 mL Intracatheter Q12H  . vancomycin  500 mg Intravenous Q12H   Continuous Infusions: .  sodium chloride 75 mL/hr at 05/17/14 0244  . bupivacaine 0.25 % ON-Q pump DUAL CATH 300 mL Stopped (05/17/14 0756)      Time spent: 25 minutes    Mischell Branford  [facility] Pager [phone]. If 7PM-7AM, please contact night-coverage at [email or web address], password [email or web address] 05/17/2014, 4:51 PM  LOS: 9 days

## 2014-05-17 NOTE — Progress Notes (Signed)
Physical Therapy Treatment Patient Details Name: [name] MRN: [id number] DOB: 01-22-1926 Today's Date: 05/17/2014    History of Present Illness 80 yo female admitted 05/08/14 withStricture of sigmoid colon with complete obstruction ,s/p colectomy/colostomy 05/12/2014    PT Comments    Pt in bed with sitter found to have wet R UE arm/gown near picc site.  Reported to RN.  Assisted pt from supine to EOB + 2 assist and increased time.  Once sitting, pt required Min assist due to poor kyphotic posture and mild c/o dizziness.  Assisted with amb limited distance + 2 assist with recliner closely behind.  Great difficulty stepping and severe posterior push.  Positioned in recliner and left in room with one on one sitter.   Follow Up Recommendations  SNF     Equipment Recommendations       Recommendations for Other Services       Precautions / Restrictions Precautions Precautions: Fall Precaution Comments: Monitor HR; abd incision, On Q pump very short line Restrictions Weight Bearing Restrictions: No    Mobility  Bed Mobility Overal bed mobility: Needs Assistance;+ 2 for safety/equipment Bed Mobility: Supine to Sit     Supine to sit: +2 for physical assistance;+2 for safety/equipment;Mod assist     General bed mobility comments: multimodal cues for technique, does only partial roll,  pt very HOH and having difficulty understanding; requires hand over hand and incr visual cues to participate  Transfers Overall transfer level: Needs assistance Equipment used: Rolling walker (2 wheeled) Transfers: Sit to/from Stand Sit to Stand: +2 physical assistance;Max assist         General transfer comment: multimodal cues for technique, severe posterior lean.  MAX assist on low back to correct.  Poor posture.  Kyphotic and unable to stand upright.    Ambulation/Gait Ambulation/Gait assistance: +2 safety/equipment;+2 physical assistance;Max assist Ambulation Distance (Feet): 3  Feet Assistive device: Rolling walker (2 wheeled) Gait Pattern/deviations: Step-to pattern;Decreased step length - right;Decreased step length - left Gait velocity: decreased   General Gait Details: very limited amb distance due to MAX c/o weakness/fatigue.  Poor standing balance with severe posterios lean and B LE extensor tone.  Pt demon "skiing" gait.  Recliner closely following and brought up from behind.     Stairs            Wheelchair Mobility    Modified Rankin (Stroke Patients Only)       Balance                                    Cognition                            Exercises      General Comments        Pertinent Vitals/Pain  no c/o pain    Home Living                      Prior Function            PT Goals (current goals can now be found in the care plan section) Progress towards PT goals: Progressing toward goals    Frequency  Min 3X/week    PT Plan Current plan remains appropriate    Co-evaluation             End of Session Equipment Utilized  During Treatment: Gait belt Activity Tolerance: Patient limited by fatigue Patient left: in chair;with nursing/sitter in room;with call bell/phone within reach     Time: 1429-1456 PT Time Calculation (min): 27 min  Charges:  $Gait Training: 8-22 mins $Therapeutic Activity: 8-22 mins                    G Codes:      Felecia ShellingLori Malasha Kleppe  PTA [facility]  Acute  Rehab Pager      [phone]

## 2014-05-17 NOTE — Progress Notes (Signed)
Patient interviewed and examined, agree with NP note above.   Mariella SaaBenjamin T Quincy Boy MD, FACS  05/17/2014 6:11 PM

## 2014-05-17 NOTE — Progress Notes (Signed)
ANTIBIOTIC CONSULT NOTE - FOLLOW UP  Pharmacy Consult for Vancomycin and Zosyn Indication: Sepsis/peritonitis  Allergies  Allergen Reactions  . Antihistamines, Chlorpheniramine-Type Other (See Comments)    headache  . Codeine     headache    Patient Measurements: Height: 5\' 2"  (157.5 cm) Weight: 150 lb 5.7 oz (68.2 kg) IBW/kg (Calculated) : 50.1  Vital Signs: Temp: 97.5 F (36.4 C) (10/21 1230) Temp Source: Oral (10/21 1230) BP: 114/72 mmHg (10/21 1230) Pulse Rate: 91 (10/21 1230) Intake/Output from previous day: 10/20 0701 - 10/21 0700 In: 2420.4 [P.O.:120; I.V.:1450.4; IV Piggyback:850] Out: 1261 [Urine:685; Stool:576] Intake/Output from this shift: Total I/O In: 200 [IV Piggyback:200] Out: 1445 [Urine:1125; Stool:320]  Labs:  Recent Labs  05/15/14 0345 05/16/14 0400 05/17/14 0725  WBC 15.7* 11.9*  --   HGB 10.1* 9.5*  --   PLT 235 210  --   CREATININE 0.62 0.64 0.51   Estimated Creatinine Clearance: 45.7 ml/min (by C-G formula based on Cr of 0.51).  Recent Labs  05/17/14 1050  VANCOTROUGH 12.4       Assessment: 80yo F admitted 10/12 with worsening LE edema and chronic colonic obstruction. Rocephin started for possible UTI. 10/16 underwent sigmoid colectomy, end colostomy, LOA, serosal repair, and repair of supra-umbilical hernia. Pharmacy is asked to dose Zosyn and Vanc for possible sepsis/peritonitis.   10/13 >> ceftriaxone >> 10/18 10/18 >> Zosyn >>  10/18 >> Vanc >>  Tmax: AF WBCs: trending down, no CBC this AM Renal: SCr wnl, CrCl 53 ml/min (CG, SCr adjusted to 0.8 for age)  10/12 urine: P. mirabilis - susc to all except R to NTF 10/18 blood x2: ngtd 10/14 MRSA screen: neg  Dose adjustments/levels: Vancomycin trough ordered for 17:00 tonight but was drawn in error at 10:50. This is NOT a real trough as it was obtained ~5 hours after 6AM dose was given. Safe to assume that at true trough this evening will be less than goal, therefore, will  empirically increase vancomycin dose.   Goal of Therapy:  Vancomycin trough level 15-20 mcg/ml Doses adjusted per renal function Eradication of infection  Plan:   Increase Vancomycin to 750mg  IV q12h  Continue Zosyn 3.375gm IV q8h (4hr extended infusions)  F/u planned duration of therapy for antibiotics.    Loralee PacasErin Ardie Mclennan, PharmD, BCPS Pager: [phone] 05/17/2014,5:22 PM

## 2014-05-17 NOTE — Progress Notes (Signed)
Text paged Dr. Gonzella Lexhungel pts K= 2.9 this am, asked MD to order K for replacement

## 2014-05-17 NOTE — Progress Notes (Signed)
Patient ID: [name], female   DOB: 1926/05/11, 80 y.o.   MRN: [id number]     [facility]      [address] [place], [place] [address]    Phone: [phone] FAX: [phone]     Subjective: Tolerated clears.  Now hypertensive.  Low K, being supplemented.  Afebrile.  VSS.  WBC continues to trend down.   Objective:  Vital signs:  Filed Vitals:   05/17/14 0400 05/17/14 0500 05/17/14 0600 05/17/14 0700  BP: 132/81 135/75 128/76 167/77  Pulse: 78 82 83 90  Temp:   97.5 F (36.4 C)   TempSrc:   Axillary   Resp: 24 22 21 23   Height:      Weight: 150 lb 5.7 oz (68.2 kg)     SpO2: 94% 94% 93% 93%    Last BM Date: 05/16/14  Intake/Output   Yesterday:  10/20 0701 - 10/21 0700 In: 2420.4 [P.O.:120; I.V.:1450.4; IV Piggyback:850] Out: 1261 [Urine:685; Stool:576] This shift:  Total I/O In: -  Out: 350 [Urine:350]   Physical Exam: General: Pt awake/alert/oriented, acute distress Abdomen: Soft.  Nondistended.  Midline wound is c/d/i.  LLQ ostomy is pink, swollen, viable, liquid stool in ostomy. No evidence of peritonitis.  No incarcerated hernias.   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 Active Problems:   Hypokalemia   Diverticulosis of sigmoid colon   Fibromyalgia   HOH (hard of hearing)   Dementia   Shock circulatory    Results:   Labs: Results for orders placed during the hospital encounter of 05/08/14 (from the past 48 hour(s))  CBC     Status: Abnormal   Collection Time    05/16/14  4:00 AM      Result Value Ref Range   WBC 11.9 (*) 4.0 - 10.5 K/uL   RBC 3.17 (*) 3.87 - 5.11 MIL/uL   Hemoglobin 9.5 (*) 12.0 - 15.0 g/dL   HCT 27.8 (*) 36.0 - 46.0 %   MCV 87.7  78.0 - 100.0 fL   MCH 30.0  26.0 - 34.0 pg   MCHC 34.2  30.0 - 36.0 g/dL   RDW 15.2  11.5 - 15.5 %   Platelets 210  150 - 400 K/uL  BASIC METABOLIC PANEL      Status: Abnormal   Collection Time    05/16/14  4:00 AM      Result Value Ref Range   Sodium 136 (*) 137 - 147 mEq/L   Potassium 2.9 (*) 3.7 - 5.3 mEq/L   Comment: CRITICAL RESULT CALLED TO, READ BACK BY AND VERIFIED WITH:     [facility] RN AT 0512 10.20.15 BY TIBBITTS,K   Chloride 100  96 - 112 mEq/L   CO2 26  19 - 32 mEq/L   Glucose, Bld 105 (*) 70 - 99 mg/dL   BUN 15  6 - 23 mg/dL   Creatinine, Ser 0.64  0.50 - 1.10 mg/dL   Calcium 7.3 (*) 8.4 - 10.5 mg/dL   GFR calc non Af Amer 79 (*) >90 mL/min   GFR calc Af Amer >90  >90 mL/min   Comment: (NOTE)     The eGFR has been calculated using the CKD EPI equation.     This calculation has not been validated in all clinical situations.     eGFR's persistently <90 mL/min signify possible Chronic Kidney     Disease.  Anion gap 10  5 - 15  PHOSPHORUS     Status: Abnormal   Collection Time    05/16/14  4:00 AM      Result Value Ref Range   Phosphorus 1.4 (*) 2.3 - 4.6 mg/dL  MAGNESIUM     Status: None   Collection Time    05/16/14  4:00 AM      Result Value Ref Range   Magnesium 1.6  1.5 - 2.5 mg/dL  POTASSIUM     Status: Abnormal   Collection Time    05/16/14  2:00 PM      Result Value Ref Range   Potassium 2.6 (*) 3.7 - 5.3 mEq/L   Comment: CRITICAL RESULT CALLED TO, READ BACK BY AND VERIFIED WITHLarence Penning RN 2355 73/22/02 A NAVARRO    Imaging / Studies: Dg Chest Port 1 View  05/15/2014   CLINICAL DATA:  Chronic respiratory failure. Altered level of consciousness. Shortness of breath. Subsequent encounter.  EXAM: PORTABLE CHEST - 1 VIEW  COMPARISON:  05/14/2014; 05/08/2014; 04/22/2014  FINDINGS: Grossly unchanged cardiac silhouette and mediastinal contours with atherosclerotic plaque within the thoracic aorta. Interval development of a small right-sided effusion with unchanged small to moderate size left-sided pleural effusion. Slight worsening of bibasilar opacities, left greater than right. Mild pulmonary venous  congestion without frank evidence of edema. No pneumothorax, though note, evaluation lung apices is excluded secondary to the patient's overlying chin. Regional soft tissues appear normal.  IMPRESSION: 1. Interval development of a small right-sided effusion with worsening bibasilar opacities, right greater than left, atelectasis versus infiltrate. 2. Pulmonary venous congestion without frank evidence of edema.   Electronically Signed   By: Sandi Mariscal M.D.   On: 05/15/2014 11:32    Medications / Allergies:  Scheduled Meds: . antiseptic oral rinse  7 mL Mouth Rinse q12n4p  . antiseptic oral rinse  7 mL Mouth Rinse BID  . chlorhexidine  15 mL Mouth Rinse BID  . feeding supplement (RESOURCE BREEZE)  1 Container Oral BID BM  . heparin  5,000 Units Subcutaneous 3 times per day  . Influenza vac split quadrivalent PF  0.5 mL Intramuscular Tomorrow-1000  . lactated ringers  1,000 mL Intravenous Once  . [START ON 05/19/2014] levothyroxine  100 mcg Oral Once per day on Mon Fri  . levothyroxine  112 mcg Oral Once per day on Sun Tue Wed Thu Sat  . lip balm  1 application Topical BID  . piperacillin-tazobactam (ZOSYN)  IV  3.375 g Intravenous 3 times per day  . sodium chloride  500 mL Intravenous Once  . sodium chloride  10-40 mL Intracatheter Q12H  . vancomycin  500 mg Intravenous Q12H   Continuous Infusions: . sodium chloride 75 mL/hr at 05/17/14 0244  . amiodarone 30 mg/hr (05/17/14 0137)  . bupivacaine 0.25 % ON-Q pump DUAL CATH 300 mL Stopped (05/17/14 0756)   PRN Meds:.acetaminophen, acetaminophen, alum & mag hydroxide-simeth, diphenhydrAMINE, haloperidol lactate, magic mouthwash, menthol-cetylpyridinium, ondansetron (ZOFRAN) IV, ondansetron (ZOFRAN) IV, phenol, promethazine, sodium chloride  Antibiotics: Anti-infectives   Start     Dose/Rate Route Frequency Ordered Stop   05/15/14 0600  vancomycin (VANCOCIN) 500 mg in sodium chloride 0.9 % 100 mL IVPB     500 mg 100 mL/hr over 60 Minutes  Intravenous Every 12 hours 05/14/14 1602     05/14/14 1630  piperacillin-tazobactam (ZOSYN) IVPB 3.375 g     3.375 g 12.5 mL/hr over 240 Minutes Intravenous 3 times  per day 05/14/14 1602     05/14/14 1630  vancomycin (VANCOCIN) IVPB 1000 mg/200 mL premix     1,000 mg 200 mL/hr over 60 Minutes Intravenous  Once 05/14/14 1602 05/14/14 1751   05/11/14 0600  cefoTEtan (CEFOTAN) 2 g in dextrose 5 % 50 mL IVPB  Status:  Discontinued     2 g 100 mL/hr over 30 Minutes Intravenous On call to O.R. 05/10/14 1512 05/12/14 1637   05/09/14 0215  cefTRIAXone (ROCEPHIN) 1 g in dextrose 5 % 50 mL IVPB  Status:  Discontinued     1 g 100 mL/hr over 30 Minutes Intravenous Daily at bedtime 05/09/14 0203 05/14/14 1536        Assessment/Plan POD#5 exploratory laparotomy, sigmoid colectomy, LOA, serosal repair, colostomy---Dr. Johney Maine 05/12/14 -advance to full liquid diet -IS -Zosyn D#3, continue until WBC normalizes  -SCD/heparin -PT eval  -WOC following -stable for transfer from surgical standpoint -further management per primary team   Erby Pian, ANP-BC [facility] Pager [phone](7A-4:30P)   05/17/2014 8:30 AM
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
## 2014-05-17 NOTE — Progress Notes (Signed)
Subjective:  Pleasant, no CP, no SOB, no further afib. K-2.6  Objective:  Vital Signs in the last 24 hours: Temp:  [96.8 F (36 C)-97.7 F (36.5 C)] 97.7 F (36.5 C) (10/21 0800) Pulse Rate:  [74-99] 90 (10/21 0700) Resp:  [15-28] 23 (10/21 0700) BP: (112-167)/(65-100) 167/77 mmHg (10/21 0700) SpO2:  [84 %-98 %] 93 % (10/21 0700) Weight:  [150 lb 5.7 oz (68.2 kg)] 150 lb 5.7 oz (68.2 kg) (10/21 0400)  Intake/Output from previous day: 10/20 0701 - 10/21 0700 In: 2420.4 [P.O.:120; I.V.:1450.4; IV Piggyback:850] Out: 1261 [Urine:685; Stool:576]   Physical Exam: General:Elderly pleasant, in no acute distress. Head:  Normocephalic and atraumatic. Lungs: Clear to auscultation and percussion. Heart: Normal S1 and S2.  Soft systolic apical murmur, rubs or gallops.  Abdomen: post op changes Extremities: No clubbing or cyanosis. No edema. Neurologic: Alert MAE   Lab Results:  Recent Labs  05/15/14 0345 05/16/14 0400  WBC 15.7* 11.9*  HGB 10.1* 9.5*  PLT 235 210    Recent Labs  05/15/14 0345 05/16/14 0400 05/16/14 1400  NA 137 136*  --   K 3.4* 2.9* 2.6*  CL 100 100  --   CO2 26 26  --   GLUCOSE 116* 105*  --   BUN 16 15  --   CREATININE 0.62 0.64  --    Imaging: Dg Chest Port 1 View  05/15/2014   CLINICAL DATA:  Chronic respiratory failure. Altered level of consciousness. Shortness of breath. Subsequent encounter.  EXAM: PORTABLE CHEST - 1 VIEW  COMPARISON:  05/14/2014; 05/08/2014; 04/22/2014  FINDINGS: Grossly unchanged cardiac silhouette and mediastinal contours with atherosclerotic plaque within the thoracic aorta. Interval development of a small right-sided effusion with unchanged small to moderate size left-sided pleural effusion. Slight worsening of bibasilar opacities, left greater than right. Mild pulmonary venous congestion without frank evidence of edema. No pneumothorax, though note, evaluation lung apices is excluded secondary to the patient's  overlying chin. Regional soft tissues appear normal.  IMPRESSION: 1. Interval development of a small right-sided effusion with worsening bibasilar opacities, right greater than left, atelectasis versus infiltrate. 2. Pulmonary venous congestion without frank evidence of edema.   Electronically Signed   By: Simonne ComeJohn  Watts M.D.   On: 05/15/2014 11:32    Telemetry: NSR Personally viewed.    Cardiac Studies:  EF 55%  Scheduled Meds: . amiodarone  200 mg Oral BID  . antiseptic oral rinse  7 mL Mouth Rinse q12n4p  . antiseptic oral rinse  7 mL Mouth Rinse BID  . chlorhexidine  15 mL Mouth Rinse BID  . feeding supplement (RESOURCE BREEZE)  1 Container Oral BID BM  . heparin  5,000 Units Subcutaneous 3 times per day  . Influenza vac split quadrivalent PF  0.5 mL Intramuscular Tomorrow-1000  . lactated ringers  1,000 mL Intravenous Once  . [START ON 05/19/2014] levothyroxine  100 mcg Oral Once per day on Mon Fri  . levothyroxine  112 mcg Oral Once per day on Sun Tue Wed Thu Sat  . lip balm  1 application Topical BID  . piperacillin-tazobactam (ZOSYN)  IV  3.375 g Intravenous 3 times per day  . sodium chloride  500 mL Intravenous Once  . sodium chloride  10-40 mL Intracatheter Q12H  . vancomycin  500 mg Intravenous Q12H   Continuous Infusions: . sodium chloride 75 mL/hr at 05/17/14 0244  . 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  1) Post op afib  - improved with amiodarone, currently NSR  - continue with amiodarone 200 BID for 2 weeks then 200 QD thereafter and if NSR at one month, consider DC amiodarone  - Not anticoagulation candidate (see prior notes)  2) Hypokalemia  - 2.6, replete per primary team  - strive for K >4  DNR Palliative care team note reviewed  Will sign off. Please call if any questions.   SKAINS, MARK 05/17/2014, 8:59 AM

## 2014-05-17 NOTE — Progress Notes (Signed)
ANTIBIOTIC CONSULT NOTE - FOLLOW UP  Pharmacy Consult for Vancomycin and Zosyn Indication: Sepsis/peritonitis  Allergies  Allergen Reactions  . Antihistamines, Chlorpheniramine-Type Other (See Comments)    headache  . Codeine     headache    Patient Measurements: Height: 5\' 2"  (157.5 cm) Weight: 150 lb 5.7 oz (68.2 kg) IBW/kg (Calculated) : 50.1  Vital Signs: Temp: 97.7 F (36.5 C) (10/21 0800) Temp Source: Oral (10/21 0800) BP: 167/77 mmHg (10/21 0700) Pulse Rate: 90 (10/21 0700) Intake/Output from previous day: 10/20 0701 - 10/21 0700 In: 2420.4 [P.O.:120; I.V.:1450.4; IV Piggyback:850] Out: 1261 [Urine:685; Stool:576] Intake/Output from this shift: Total I/O In: -  Out: 525 [Urine:525]  Labs:  Recent Labs  05/14/14 1010 05/15/14 0345 05/16/14 0400  WBC 16.2* 15.7* 11.9*  HGB 9.4* 10.1* 9.5*  PLT 229 235 210  CREATININE  --  0.62 0.64   Estimated Creatinine Clearance: 45.7 ml/min (by C-G formula based on Cr of 0.64). No results found for this basename: VANCOTROUGH, Leodis BinetVANCOPEAK, VANCORANDOM, GENTTROUGH, GENTPEAK, GENTRANDOM, TOBRATROUGH, TOBRAPEAK, TOBRARND, AMIKACINPEAK, AMIKACINTROU, AMIKACIN,  in the last 72 hours     Assessment: 80yo F admitted 10/12 with worsening LE edema and chronic colonic obstruction. Rocephin started for possible UTI. 10/16 underwent sigmoid colectomy, end colostomy, LOA, serosal repair, and repair of supra-umbilical hernia. Pharmacy is asked to dose Zosyn and Vanc for possible sepsis/peritonitis.   10/13 >> ceftriaxone >> 10/18 10/18 >> Zosyn >>  10/18 >> Vanc >>  Tmax: AF WBCs: trending down, no CBC this AM Renal: SCr wnl, CrCl 53 ml/min (CG, SCr adjusted to 0.8 for age)  10/12 urine: P. mirabilis - susc to all except R to NTF 10/18 blood x2: ngtd 10/14 MRSA screen: neg  Dose adjustments/levels: 10/21 1700 VT: ___ on 500mg  q12h  Today is day #4 Vancomycin 1g x 1 then 500mg  q12h and Zosyn EI for sepsis 2/2 peritonitis.  Surgery wants to continue abx until WBC normalizes but no CBC ordered today.  WBC has been trending down.  Goal of Therapy:  Vancomycin trough level 15-20 mcg/ml Doses adjusted per renal function Eradication of infection  Plan:  1.  Vancomycin trough this evening. 2.  No dose adjustment to Zosyn. 3.  F/u planned duration of therapy for antibiotics.  Suggest obtaining CBC to continue to trend WBC.  Clance Bollunyon, Naythen Heikkila 05/17/2014,9:57 AM

## 2014-05-18 DIAGNOSIS — F0390 Unspecified dementia without behavioral disturbance: Secondary | ICD-10-CM

## 2014-05-18 DIAGNOSIS — E440 Moderate protein-calorie malnutrition: Secondary | ICD-10-CM | POA: Diagnosis present

## 2014-05-18 LAB — CBC
HEMATOCRIT: 32.3 % — AB (ref 36.0–46.0)
HEMOGLOBIN: 11.1 g/dL — AB (ref 12.0–15.0)
MCH: 29.8 pg (ref 26.0–34.0)
MCHC: 34.4 g/dL (ref 30.0–36.0)
MCV: 86.6 fL (ref 78.0–100.0)
Platelets: 323 10*3/uL (ref 150–400)
RBC: 3.73 MIL/uL — ABNORMAL LOW (ref 3.87–5.11)
RDW: 15.2 % (ref 11.5–15.5)
WBC: 10.2 10*3/uL (ref 4.0–10.5)

## 2014-05-18 LAB — BASIC METABOLIC PANEL
Anion gap: 9 (ref 5–15)
BUN: 8 mg/dL (ref 6–23)
CHLORIDE: 100 meq/L (ref 96–112)
CO2: 27 meq/L (ref 19–32)
CREATININE: 0.45 mg/dL — AB (ref 0.50–1.10)
Calcium: 7.6 mg/dL — ABNORMAL LOW (ref 8.4–10.5)
GFR calc Af Amer: >90 mL/min (ref >90)
GFR calc non Af Amer: 88 mL/min — ABNORMAL LOW (ref >90)
GLUCOSE: 90 mg/dL (ref 70–99)
Potassium: 2.9 mEq/L — CL (ref 3.7–5.3)
Sodium: 136 mEq/L — ABNORMAL LOW (ref 137–147)

## 2014-05-18 LAB — MAGNESIUM: Magnesium: 1.8 mg/dL (ref 1.5–2.5)

## 2014-05-18 LAB — POTASSIUM: Potassium: 3.9 mEq/L (ref 3.7–5.3)

## 2014-05-18 MED ORDER — POTASSIUM CHLORIDE 10 MEQ/100ML IV SOLN
10.0000 meq | INTRAVENOUS | Status: AC
  Administered 2014-05-18 (×5): 10 meq via INTRAVENOUS
  Filled 2014-05-18 (×5): qty 100

## 2014-05-18 MED ORDER — MAGNESIUM SULFATE 40 MG/ML IJ SOLN
2.0000 g | Freq: Once | INTRAMUSCULAR | Status: AC
  Administered 2014-05-18: 2 g via INTRAVENOUS
  Filled 2014-05-18: qty 50

## 2014-05-18 MED ORDER — POTASSIUM CHLORIDE CRYS ER 20 MEQ PO TBCR
40.0000 meq | EXTENDED_RELEASE_TABLET | ORAL | Status: AC
  Administered 2014-05-18 (×2): 40 meq via ORAL
  Filled 2014-05-18 (×2): qty 2

## 2014-05-18 MED ORDER — FUROSEMIDE 10 MG/ML IJ SOLN
40.0000 mg | Freq: Once | INTRAMUSCULAR | Status: AC
  Administered 2014-05-18: 40 mg via INTRAVENOUS
  Filled 2014-05-18: qty 4

## 2014-05-18 NOTE — Care Management Note (Addendum)
    Page 1 of 2   05/19/2014     1:07:47 PM CARE MANAGEMENT NOTE 05/19/2014  Patient:  [name],[name]   Account Number:  [id number]  Date Initiated:  05/09/2014  Documentation initiated by:  Lorenda IshiharaPEELE,SUZANNE  Subjective/Objective Assessment:   80 yo female admitted with SBO. PTA lived at home alone.     Action/Plan:   Awaiting PT evaluations, may need SNF at d/c.   Anticipated DC Date:  05/19/2014   Anticipated DC Plan:  SKILLED NURSING FACILITY  In-house referral  Clinical Social Worker      DC Planning Services  CM consult      Choice offered to / List presented to:             Status of service:  Completed, signed off Medicare Important Message given?  YES (If response is "NO", the following Medicare IM given date fields will be blank) Date Medicare IM given:  05/18/2014 Medicare IM given by:  [facility],Zoya Sprecher Date Additional Medicare IM given:   Additional Medicare IM given by:    Discharge Disposition:  SKILLED NURSING FACILITY  Per UR Regulation:  Reviewed for med. necessity/level of care/duration of stay  If discussed at Long Length of Stay Meetings, dates discussed:   05/16/2014  05/18/2014    Comments:  05/19/14 Joaopedro Eschbach RN,BSN NCM [phone] D/C SNF.  05/18/14 Trinetta Alemu RN,BSN NCM [phone] TRANSFER FROM SDU.POD#6 COLECTOMY.SX FOLLOWING.ADVANCING DIET-SOFT.D/C PLAN SNF IN AM IF MED STABLE.  [id number]/Rhonda Earlene Plateravis, RN, BSN, CCM Chart reviewed. Discharge needs and patient's stay to be reviewed and followed by case manager. 80 yo female admitted 05/08/14 withStricture of sigmoid colon with complete obstruction ,s/p colectomy/colostomy 05/12/2014

## 2014-05-18 NOTE — Progress Notes (Signed)
Sitting up in bed eating supper. No complaints. Calling light within reach

## 2014-05-18 NOTE — Progress Notes (Signed)
Patient ID: [name], female   DOB: 09-27-1925, 80 y.o.   MRN: [id number] 6 Days Post-Op  Subjective: No C/o.  Alert and conversant, appropriate  Objective: Vital signs in last 24 hours: Temp:  [97.5 F (36.4 C)-97.8 F (36.6 C)] 97.8 F (36.6 C) (10/21 2123) Pulse Rate:  [88-98] 88 (10/22 0506) Resp:  [20-25] 20 (10/22 0506) BP: (112-151)/(49-108) 121/71 mmHg (10/22 0506) SpO2:  [94 %-96 %] 96 % (10/22 0506) Last BM Date: 05/17/14  Intake/Output from previous day: 10/21 0701 - 10/22 0700 In: 500 [IV Piggyback:500] Out: 1545 [Urine:1125; Stool:420] Intake/Output this shift:    General appearance: alert, cooperative and no distress GI: normal findings: soft, non-tender and colostomy functioning and healthy Incision/Wound: No erythema;, minimal drainage lower wound  Lab Results:   Recent Labs  05/16/14 0400 05/18/14 0442  WBC 11.9* 10.2  HGB 9.5* 11.1*  HCT 27.8* 32.3*  PLT 210 323   BMET  Recent Labs  05/17/14 0725 05/18/14 0442  NA 142 136*  K 2.9* 2.9*  CL 101 100  CO2 26 27  GLUCOSE 104* 90  BUN 10 8  CREATININE 0.51 0.45*  CALCIUM 7.9* 7.6*     Studies/Results: No results found.  Anti-infectives: Anti-infectives   Start     Dose/Rate Route Frequency Ordered Stop   05/17/14 1800  vancomycin (VANCOCIN) IVPB 750 mg/150 ml premix     750 mg 150 mL/hr over 60 Minutes Intravenous Every 12 hours 05/17/14 1726     05/15/14 0600  vancomycin (VANCOCIN) 500 mg in sodium chloride 0.9 % 100 mL IVPB  Status:  Discontinued     500 mg 100 mL/hr over 60 Minutes Intravenous Every 12 hours 05/14/14 1602 05/17/14 1726   05/14/14 1630  piperacillin-tazobactam (ZOSYN) IVPB 3.375 g     3.375 g 12.5 mL/hr over 240 Minutes Intravenous 3 times per day 05/14/14 1602     05/14/14 1630  vancomycin (VANCOCIN) IVPB 1000 mg/200 mL premix     1,000 mg 200 mL/hr over 60 Minutes Intravenous  Once 05/14/14 1602 05/14/14 1751   05/11/14 0600  cefoTEtan (CEFOTAN) 2 g in  dextrose 5 % 50 mL IVPB  Status:  Discontinued     2 g 100 mL/hr over 30 Minutes Intravenous On call to O.R. 05/10/14 1512 05/12/14 1637   05/09/14 0215  cefTRIAXone (ROCEPHIN) 1 g in dextrose 5 % 50 mL IVPB  Status:  Discontinued     1 g 100 mL/hr over 30 Minutes Intravenous Daily at bedtime 05/09/14 0203 05/14/14 1536      Assessment/Plan: s/p Procedure(s): OPEN EXPLORATORY LAPAROTOMY, SIGMOID COLECTOMY, LYSIS OF ADHESIONS, SEROSAL REPAIR, COLOSTOMY Continued improvement Should be OK for SNF Hypokalemia-per medical team    LOS: 10 days    [name] T 05/18/2014

## 2014-05-18 NOTE — Progress Notes (Signed)
[facility] PROGRESS NOTE  [name] WUJ:[id number] DOB: Sep 13, 1925 DOA: 05/08/2014 PCP:  Duane Lopeoss, Alan, MD   Brief narrative 80 year old female presenting with colon obstruction and was taken to OR with lysis of adhesions, sigmoid colectomy with end colostomy with hospital course prolonged due to hypotension , delirium, respiratory failure and new onset A. fib and electrolyte imbalance.   Assessment/Plan: New onset A. fib with RVR [name] started on amiodarone drip and converted to sinus rhythm. Appreciate cardiology recommendations. - transitioned to oral amiodarone 200 mg twice a day for 2 weeks followed by 200 mg once a day. Replenishing low potassium and magnesium.Marland Kitchen. -2-D echo results pending  Hypotension [name] monitored in  stepdown unit. Required IV phenylephrine. BP now stable and has some volume overload We'll resumed home blood pressure medications.  Fluid overload Likely with IV hydration. Ordered one dose of IV Lasix. Check 2-D echo  persistant Hypokalemia Replacing aggressively with potassium with by mouth and IV . No clear cause for persistently low potassium. Does not have high colostomy output or on potassium lowering medications. By mouth intake is adequate as well. Low  magnesium replenished as well. Recheck levels in the evening. Given concern for acute prolongation while on amiodarone plan to keep potassium level greater than 4  Hypocalcemia and hypophosphatemia. Replenished  Colon obstruction # day 4 Status post lysis of adhesions, sigmoid colectomy with end colostomy, serosal repair, repair of supraumbilical ventral hernia. Tolerating diet. Surgery signed off. Discontinue antibiotics ( day 5)  Dementia/depression and Alcohol abuse No signs of withdrawal. Pt counseled during hospital stay Seen by psych consult while in the hospital and do not recommend anything further  Hypothyroidism Continue home dose Synthroid. TSH normal  UTI Culture growing  Proteus. Sensitive to Zosyn   Diet: Soft  DVT prophylaxis  Code Status: DO NOT RESUSCITATE, goals of care discussion by palliative care with [name] and family. Request to treat any treatable illness.  Family Communication: none at bedside Disposition Plan: Likely skilled nursing facility on 10/23   Consultants:  Cardiology  [facility]  PCCM  Palliative care  [facility]    Procedures:  Laparotomy on 10/21  Antibiotics:  IV vancomycin and Zosyn day 5  HPI/Subjective: [name] seen and examined this morning. Denies any complaints  Objective: Filed Vitals:   05/18/14 1422  BP: 101/43  Pulse: 87  Temp: 97.9 F (36.6 C)  Resp:     Intake/Output Summary (Last 24 hours) at 05/18/14 1441 Last data filed at 05/18/14 1425  Gross per 24 hour  Intake   1050 ml  Output   3320 ml  Net  -2270 ml   Filed Weights   05/15/14 0500 05/16/14 0400 05/17/14 0400  Weight: 58.5 kg (128 lb 15.5 oz) 67.8 kg (149 lb 7.6 oz) 68.2 kg (150 lb 5.7 oz)    Exam:   General:  Elderly female in no acute distress  HEENT: No pallor, moist oral mucosa  Chest: In bibasilar crackles  Cardiovascular: Normal S1 and S2, no murmurs  Abdomen: Soft, colostomy in place, nondistended, nontender, bowel sounds present, Foley in place  Musculoskeletal:  Trace edema bilaterally,   CNS: Alert and oriented x 2  Data Reviewed: Basic Metabolic Panel:  Recent Labs Lab 05/14/14 0705 05/15/14 0345 05/16/14 0400 05/16/14 1400 05/17/14 0725 05/18/14 0442  NA 137 137 136*  --  142 136*  K 3.1* 3.4* 2.9* 2.6* 2.9* 2.9*  CL 99 100 100  --  101 100  CO2 23 26 26   --  26 27  GLUCOSE 114* 116* 105*  --  104* 90  BUN 23 16 15   --  10 8  CREATININE 0.95 0.62 0.64  --  0.51 0.45*  CALCIUM 7.8* 7.5* 7.3*  --  7.9* 7.6*  MG 1.5  --  1.6  --  1.6 1.8  PHOS 2.8  --  1.4*  --   --   --    Liver Function Tests: No results found for this basename: AST, ALT, ALKPHOS, BILITOT, PROT, ALBUMIN,  in the last 168  hours No results found for this basename: LIPASE, AMYLASE,  in the last 168 hours No results found for this basename: AMMONIA,  in the last 168 hours CBC:  Recent Labs Lab 05/14/14 1010 05/15/14 0345 05/16/14 0400 05/18/14 0442  WBC 16.2* 15.7* 11.9* 10.2  HGB 9.4* 10.1* 9.5* 11.1*  HCT 27.4* 29.6* 27.8* 32.3*  MCV 88.4 87.6 87.7 86.6  PLT 229 235 210 323   Cardiac Enzymes: No results found for this basename: CKTOTAL, CKMB, CKMBINDEX, TROPONINI,  in the last 168 hours BNP (last 3 results)  Recent Labs  03/22/14 1612 03/22/14 1628 05/08/14 1615  PROBNP 901.5* 893.5* 1099.0*   CBG: No results found for this basename: GLUCAP,  in the last 168 hours  Recent Results (from the past 240 hour(s))  URINE CULTURE     Status: None   Collection Time    05/08/14 10:58 PM      Result Value Ref Range Status   Specimen Description URINE, RANDOM   Final   Special Requests NONE   Final   Culture  Setup Time     Final   Value: 05/09/2014 08:19     Performed at [facility] Count     Final   Value: >=100,000 COLONIES/ML     Performed at [facility]   Culture     Final   Value: PROTEUS MIRABILIS     Performed at [facility]   Report Status 05/13/2014 FINAL   Final   Organism ID, Bacteria PROTEUS MIRABILIS   Final  SURGICAL PCR SCREEN     Status: None   Collection Time    05/10/14  6:42 PM      Result Value Ref Range Status   MRSA, PCR NEGATIVE  NEGATIVE Final   Staphylococcus aureus NEGATIVE  NEGATIVE Final   Comment:            The Xpert SA Assay (FDA     approved for NASAL specimens     in patients over 22 years of age),     is one component of     a comprehensive surveillance     program.  Test performance has     been validated by [employer] for patients greater     than or equal to 72 year old.     It is not intended     to diagnose infection nor to     guide or monitor treatment.  CULTURE, BLOOD (ROUTINE X 2)     Status:  None   Collection Time    05/14/14  7:05 AM      Result Value Ref Range Status   Specimen Description BLOOD LEFT ARM   Final   Special Requests     Final   Value: BOTTLES DRAWN AEROBIC AND ANAEROBIC 10CC BLUE BOTTLE, 5CC RED BOTTLE   Culture  Setup Time  Final   Value: 05/14/2014 15:18     [facility]   Culture     Final   Value:        BLOOD CULTURE RECEIVED NO GROWTH TO DATE CULTURE WILL BE HELD FOR 5 DAYS BEFORE ISSUING A FINAL NEGATIVE REPORT     Note: Culture results may be compromised due to an excessive volume of blood received in culture bottles.     [facility]formed at [facility]   Report Status PENDING   Incomplete  CULTURE, BLOOD (ROUTINE X 2)     Status: None   Collection Time    05/14/14 10:11 AM      Result Value Ref Range Status   Specimen Description BLOOD LEFT ARM   Final   Special Requests BOTTLES DRAWN AEROBIC ONLY 3CC BLUE BOTTLE   Final   Culture  Setup Time     Final   Value: 05/14/2014 18:50     Performed at [facility]   Culture     Final   Value:        BLOOD CULTURE RECEIVED NO GROWTH TO DATE CULTURE WILL BE HELD FOR 5 DAYS BEFORE ISSUING A FINAL NEGATIVE REPORT     [facility]formed at [facility]   Report Status PENDING   Incomplete     Studies: No results found.  Scheduled Meds: . amiodarone  200 mg Oral BID  . antiseptic oral rinse  7 mL Mouth Rinse q12n4p  . antiseptic oral rinse  7 mL Mouth Rinse BID  . chlorhexidine  15 mL Mouth Rinse BID  . feeding supplement (RESOURCE BREEZE)  1 Container Oral BID BM  . heparin  5,000 Units Subcutaneous 3 times per day  . Influenza vac split quadrivalent PF  0.5 mL Intramuscular Tomorrow-1000  . lactated ringers  1,000 mL Intravenous Once  . [START ON 05/19/2014] levothyroxine  100 mcg Oral Once per day on Mon Fri  . levothyroxine  112 mcg Oral Once per day on Sun Tue Wed Thu Sat  . lip balm  1 application Topical BID  . piperacillin-tazobactam (ZOSYN)  IV  3.375 g  Intravenous 3 times per day  . potassium chloride  10 mEq Intravenous Q1 Hr x 5  . sodium chloride  500 mL Intravenous Once  . sodium chloride  10-40 mL Intracatheter Q12H  . vancomycin  750 mg Intravenous Q12H   Continuous Infusions: . bupivacaine 0.25 % ON-Q pump DUAL CATH 300 mL Stopped (05/17/14 0756)      Time spent: 25 minutes    Brittnae Aschenbrenner  [facility] Pager [phone]. If 7PM-7AM, please contact night-coverage at [email or web address], password [email or web address] 05/18/2014, 2:41 PM  LOS: 10 days

## 2014-05-18 NOTE — Progress Notes (Signed)
  Echocardiogram 2D Echocardiogram has been performed.  [name], [name] 05/18/2014, 1:47 PM

## 2014-05-19 DIAGNOSIS — B9689 Other specified bacterial agents as the cause of diseases classified elsewhere: Secondary | ICD-10-CM

## 2014-05-19 DIAGNOSIS — N3090 Cystitis, unspecified without hematuria: Secondary | ICD-10-CM

## 2014-05-19 LAB — BASIC METABOLIC PANEL
ANION GAP: 9 (ref 5–15)
BUN: 6 mg/dL (ref 6–23)
CO2: 30 mEq/L (ref 19–32)
Calcium: 7.7 mg/dL — ABNORMAL LOW (ref 8.4–10.5)
Chloride: 95 mEq/L — ABNORMAL LOW (ref 96–112)
Creatinine, Ser: 0.49 mg/dL — ABNORMAL LOW (ref 0.50–1.10)
GFR calc non Af Amer: 86 mL/min — ABNORMAL LOW (ref >90)
Glucose, Bld: 78 mg/dL (ref 70–99)
POTASSIUM: 3 meq/L — AB (ref 3.7–5.3)
SODIUM: 134 meq/L — AB (ref 137–147)

## 2014-05-19 LAB — MAGNESIUM: MAGNESIUM: 1.8 mg/dL (ref 1.5–2.5)

## 2014-05-19 MED ORDER — AMIODARONE HCL 200 MG PO TABS: 200.0000 mg | ORAL_TABLET | Freq: Two times a day (BID) | ORAL | Status: DC

## 2014-05-19 MED ORDER — BOOST / RESOURCE BREEZE PO LIQD: 1.0000 | Freq: Two times a day (BID) | ORAL | Status: AC

## 2014-05-19 MED ORDER — POTASSIUM CHLORIDE CRYS ER 20 MEQ PO TBCR: 60.0000 meq | EXTENDED_RELEASE_TABLET | Freq: Two times a day (BID) | ORAL | Status: AC

## 2014-05-19 MED ORDER — POTASSIUM CHLORIDE CRYS ER 20 MEQ PO TBCR
40.0000 meq | EXTENDED_RELEASE_TABLET | ORAL | Status: AC
  Administered 2014-05-19 (×2): 40 meq via ORAL
  Filled 2014-05-19: qty 2
  Filled 2014-05-19: qty 4

## 2014-05-19 MED ORDER — MAGNESIUM OXIDE -MG SUPPLEMENT 200 MG PO TABS: 1.0000 | ORAL_TABLET | Freq: Every day | ORAL | Status: DC

## 2014-05-19 MED ORDER — MAGNESIUM SULFATE 40 MG/ML IJ SOLN
2.0000 g | Freq: Once | INTRAMUSCULAR | Status: AC
  Administered 2014-05-19: 2 g via INTRAVENOUS
  Filled 2014-05-19: qty 50

## 2014-05-19 MED ORDER — AMIODARONE HCL 200 MG PO TABS: 200.0000 mg | ORAL_TABLET | Freq: Every day | ORAL | Status: DC

## 2014-05-19 MED ORDER — MAGIC MOUTHWASH: 15.0000 mL | Freq: Four times a day (QID) | ORAL | Status: AC | PRN

## 2014-05-19 NOTE — Discharge Summary (Addendum)
Physician Discharge Summary  [name] GNF:[id number] DOB: 1925-09-28 DOA: 05/08/2014  PCP:  Duane Lopeoss, Alan, MD  Admit date: 05/08/2014 Discharge date: 05/19/2014  Time spent: 35 minutes  Recommendations for Outpatient Follow-up:  #1 Discharged to skilled nursing facility. #2 Please monitor her potassium level in the 2-3 days with goal of keeping the level of > 4.  #3 Followup with cardiology and general surgery has been made. 4. Patient has a Foley catheter placed in the unit attention was in the hospital. She will be discharged on it. Please attempt voiding trial in next few days.  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 05/12/2014   Active Problems:    Atrial fibrillation with RVR   Severe Hypokalemia   Malnutrition of moderate degree   Hypomagnesemia   Hypothyroidism   Diverticulosis of sigmoid colon   Fibromyalgia   HOH (hard of hearing)   Dementia   Circulatory  Shock  klebsiella UTI   Discharge Condition: Fair  Diet recommendation: Soft  CODE STATUS: DO NOT RESUSCITATE  Filed Weights   05/15/14 0500 05/16/14 0400 05/17/14 0400  Weight: 58.5 kg (128 lb 15.5 oz) 67.8 kg (149 lb 7.6 oz) 68.2 kg (150 lb 5.7 oz)    History of present illness:  Please refer to admission H&P in detail, but in brief, 80 year old female presenting with colon obstruction and was taken to OR with lysis of adhesions, sigmoid colectomy with end colostomy with hospital course prolonged due to hypotension , delirium, respiratory failure and new onset A. fib and electrolyte imbalance   Hospital Course:  New onset A. fib with RVR  Patient developed rapid A. fib was in the hospital and started on amiodarone drip and converted to sinus rhythm. Appreciate cardiology recommendations. - transitioned to oral amiodarone 200 mg twice a day for 2 weeks followed by 200 mg once a day.  Replenishing low potassium and  magnesium.Marland Kitchen.  -2-D echo results showing EF of 50-55% with no wall motion abnormality.  Colon obstruction  # day 7 :Status post lysis of adhesions, sigmoid colectomy with end colostomy, serosal repair, repair of supraumbilical ventral hernia. Tolerating diet. Surgery signed off and we'll follow her up as outpatient Discontinue antibiotics after 5 days. Colostomy functioning fine and care instructions provided by surgery.  Hypotension  Patient monitored in stepdown unit on please postop day 2 secondary to hypertension. Required IV phenylephrine. BP now stable. Resume home blood pressure medications Patient has chronic leg edema  persistant Hypokalemia and hypomagnesemia -Patient had persistently low potassium in the 2s which was being aggressively replenished. Also has low magnesium which is being replenished every day. No clear etiology. Not on any medications to cause hypokalemia and does not have high colostomy output. -Given concern for acute prolongation while on amiodarone plan to keep potassium level greater than 4. Potassium today is 3 and is being replaced prior to discharge. I have added oral potassium 60 mEq twice daily and her potassium needs to be checked in next 2 days. I have also added magnesium oxide tablets. -Please monitor potassium closely in the facility and as outpatient.   Hypocalcemia and hypophosphatemia.  Replenished    Dementia/depression and Alcohol abuse  No signs of withdrawal. Pt counseled during hospital stay  Seen by psych consult while in the hospital and do not recommend anything further   Hypothyroidism  Continue home dose Synthroid. TSH normal   UTI  Culture growing Proteus. Sensitive to Zosyn and  has history 5 days of antibiotics  Diet: Soft   Code Status: DO NOT RESUSCITATE, goals of care discussion by palliative care with patient and family. Request to treat any treatable illness.    Family Communication: none at bedside   Disposition  Plan:skilled nursing facility  Consultants:  Cardiology  [facility]  PCCM  Palliative care  [facility]   Procedures:  Exploratory  laparotomy with sigmoid colectomy, lysis of adhesions, serosal repair and colostomy on 05/12/2014   Antibiotics:  IV vancomycin and Zosyn       Discharge Exam: Filed Vitals:   05/19/14 0937  BP: 114/69  Pulse: 83  Temp:   Resp:     General: Elderly female in no acute distress  HEENT: No pallor, moist oral mucosa  Chest: clear bilaterally, no added sounds  Cardiovascular: Normal S1 and S2, no murmurs  Abdomen: Soft, colostomy in place, nondistended, nontender, bowel sounds present, Foley in place  Musculoskeletal: Trace edema bilaterally, ( chronic)  CNS: Alert and oriented x 2 , hard of hearing   Discharge Instructions You were cared for by a hospitalist during your hospital stay. If you have any questions about your discharge medications or the care you received while you were in the hospital after you are discharged, you can call the unit and asked to speak with the hospitalist on call if the hospitalist that took care of you is not available. Once you are discharged, your primary care physician will handle any further medical issues. Please note that NO REFILLS for any discharge medications will be authorized once you are discharged, as it is imperative that you return to your primary care physician (or establish a relationship with a primary care physician if you do not have one) for your aftercare needs so that they can reassess your need for medications and monitor your lab values.  Discharge Instructions   Call MD for:  extreme fatigue    Complete by:  As directed      Call MD for:  hives    Complete by:  As directed      Call MD for:  persistant nausea and vomiting    Complete by:  As directed      Call MD for:  redness, tenderness, or signs of infection (pain, swelling, redness, odor or green/yellow discharge around incision site)     Complete by:  As directed      Call MD for:  severe uncontrolled pain    Complete by:  As directed      Call MD for:    Complete by:  As directed   Temperature > 101.1F     Diet - low sodium heart healthy    Complete by:  As directed      Discharge instructions    Complete by:  As directed   Please see discharge instruction sheets.  Also refer to handout given an office.  Please call our office if you have any questions or concerns [phone]     Discharge wound care:    Complete by:  As directed   If you have closed incisions, shower and bathe over these incisions with soap and water every day.  Remove all surgical dressings on postoperative day #3.  You do not need to replace dressings over the closed incisions unless you feel more comfortable with a Band-Aid covering it.   If you have an open wound that requires packing, please see wound care instructions.  In general, remove all dressings, wash  wound with soap and water and then replace with saline moistened gauze.  Do the dressing change at least every day.  Please call our office [phone] if you have further questions.     Driving Restrictions    Complete by:  As directed   No driving until off narcotics and can safely swerve away without pain during an emergency     Increase activity slowly    Complete by:  As directed   Walk an hour a day.  Use 20-30 minute walks.  When you can walk 30 minutes without difficulty, increase to low impact/moderate activities such as biking, jogging, swimming, sexual activity..  Eventually can increase to unrestricted activity when not feeling pain.  If you feel pain: STOP!Marland Kitchen   Let pain protect you from overdoing it.  Use ice/heat/over-the-counter pain medications to help minimize his soreness.  Use pain prescriptions as needed to remain active.  It is better to take extra pain medications and be more active than to stay bedridden to avoid all pain medications.     Lifting restrictions    Complete  by:  As directed   Avoid heavy lifting initially.  Do not push through pain.  You have no specific weight limit.  Coughing and sneezing or four more stressful to your incision than any lifting you will do. Pain will protect you from injury.  Therefore, avoid intense activity until off all narcotic pain medications.  Coughing and sneezing or four more stressful to your incision than any lifting he will do.     May shower / Bathe    Complete by:  As directed      May walk up steps    Complete by:  As directed      Sexual Activity Restrictions    Complete by:  As directed   Sexual activity as tolerated.  Do not push through pain.  Pain will protect you from injury.     Walk with assistance    Complete by:  As directed   Walk over an hour a day.  May use a walker/cane/companion to help with balance and stamina.          Current Discharge Medication List    START taking these medications   Details  Alum & Mag Hydroxide-Simeth (MAGIC MOUTHWASH) SOLN Take 15 mLs by mouth 4 (four) times daily as needed for mouth pain (sore throat). Qty: 120 mL, Refills: 0    !! amiodarone (PACERONE) 200 MG tablet Take 1 tablet (200 mg total) by mouth 2 (two) times daily. Qty: 24 tablet, Refills: 0 until 05/31/2014    !! amiodarone (PACERONE) 200 MG tablet Take 1 tablet (200 mg total) by mouth daily. Qty: 30 tablet, Refills: 0 from  05/31/2014    feeding supplement, RESOURCE BREEZE, (RESOURCE BREEZE) LIQD Take 1 Container by mouth 2 (two) times daily between meals. Qty: 60 Container, Refills: 0    Magnesium Oxide 200 MG TABS Take 1 tablet (200 mg total) by mouth daily. Qty: 30 tablet, Refills: 0    potassium chloride SA (K-DUR,KLOR-CON) 20 MEQ tablet Take 3 tablets (60 mEq total) by mouth 2 (two) times daily. Qty: 20 tablet, Refills: 0     !! - Potential duplicate medications found. Please discuss with provider.    CONTINUE these medications which have CHANGED   Details  traMADol (ULTRAM) 50 MG  tablet Take 1-2 tablets (50-100 mg total) by mouth every 6 (six) hours as needed for moderate pain. Qty: 40 tablet, Refills:  0      CONTINUE these medications which have NOT CHANGED   Details  !! levothyroxine (SYNTHROID, LEVOTHROID) 100 MCG tablet Take 100 mcg by mouth. Take 100 mcg on Monday and Friday.  Take 112 mcg all other days.    !! levothyroxine (SYNTHROID, LEVOTHROID) 112 MCG tablet Take 112 mcg by mouth. Take 112 mcg on Tuesday, Wednesday, Thursday, Saturday and Sunday.Take 100 mcg on other days.    loperamide (IMODIUM A-D) 2 MG tablet Take 2 mg by mouth 4 (four) times daily as needed for diarrhea or loose stools.    nabumetone (RELAFEN) 750 MG tablet Take 750 mg by mouth as needed for mild pain (as directed.).     polyethylene glycol (MIRALAX / GLYCOLAX) packet Take 17 g by mouth daily as needed for mild constipation.      !! - Potential duplicate medications found. Please discuss with provider.     Allergies  Allergen Reactions  . Antihistamines, Chlorpheniramine-Type Other (See Comments)    headache  . Codeine     headache   Follow-up Information   Follow up with Ardeth Sportsman., MD On 06/07/2014. (arrive by 10:15AM for a   10:45AM appt for a post operative  check up)    Specialty:  General Surgery   Contact information:   [address] [place] [address] [phone]       Follow up with Tereso Newcomer, PA-C On 06/08/2014. (at 11:50 am)    Specialty:  Physician Assistant   Contact information:   [address] [place] [address] [phone]        The results of significant diagnostics from this hospitalization (including imaging, microbiology, ancillary and laboratory) are listed below for reference.    Significant Diagnostic Studies: Dg Chest 2 View  05/08/2014   CLINICAL DATA:  Bilateral leg edema, redness. CT bite on her left leg in may which is now a swelling  EXAM: CHEST  2 VIEW  COMPARISON:  03/22/2014   FINDINGS: Low lung volumes. Bibasilar atelectasis. Mild cardiomegaly. Large hiatal hernia. No effusions or acute bony abnormality.  IMPRESSION: Low lung volumes with bibasilar atelectasis.  Large hiatal hernia.   Electronically Signed   By: Charlett Nose M.D.   On: 05/08/2014 17:13   Ct Abdomen Pelvis W Contrast  05/08/2014   CLINICAL DATA:  Mid abdominal pain with nausea and distention for 3 days.  EXAM: CT ABDOMEN AND PELVIS WITH CONTRAST  TECHNIQUE: Multidetector CT imaging of the abdomen and pelvis was performed using the standard protocol following bolus administration of intravenous contrast.  CONTRAST:  50mL OMNIPAQUE IOHEXOL 300 MG/ML SOLN, 80mL OMNIPAQUE IOHEXOL 300 MG/ML SOLN  COMPARISON:  10/06/2013  FINDINGS: Lower chest:  Moderate-sized hiatal hernia.  Hepatobiliary: Gallbladder not well seen.  No biliary dilatation.  Pancreas: Mildly atrophic pancreas with a 1.2 by 0.7 cm hypodense lesion in the inferior aspect of the pancreatic body, image 28 series 2,, probably similar to the 2010 exam and accordingly likely benign.  Spleen: Small residual splenic tissue in the medial left upper quadrant.  Adrenals/Urinary Tract: Fullness of both adrenal glands without discrete mass. Bilateral cysts. No definite hydronephrosis or stones. The ureters are difficult to follow due to paucity of intra-abdominal bowel gas and the highly distended bowel obscuring year ureteral margins. Suspected bladder diverticulum adjacent to the urethra compatible with cystocele.  Stomach/Bowel: Abnormal low position of the anorectal junction compatible with pelvic floor laxity. The extensive dilated colon with air-fluid levels.  No discrete pneumatosis. Dilated loops of small bowel with air-fluid levels noted. The colonic dilatation has worsened compared to 10/06/2013  Vascular/Lymphatic: Aortoiliac atherosclerotic vascular disease.  Reproductive: Uterus absent.  Ovaries not well seen.  Other: Considerable diffuse subcutaneous edema  most notable in the vicinity anatomic pelvis.  Musculoskeletal: Dextroconvex thoracolumbar scoliosis with rotary component. Bony demineralization. Lumbar spondylosis and degenerative disc disease causing multilevel foraminal impingement. Grade 1 anterolisthesis at L3-4 and L4-5.  IMPRESSION: 1. Extensive and worse and dilatation of small and large bowel with diffuse air-fluid levels. There is something of a transition point at the junction of the descending and sigmoid colon in terms of bowel caliber, and colonoscopy may be prudent to ensure that there is no actual obstruction in this vicinity, but this appearance could well be functional or due to ileus. 2. Extensive pelvic floor laxity with cystocele and low position of the anorectal junction. 3. Moderate-sized hiatal hernia. 4. Small approximately 1 cm hypodense lesion inferiorly along the pancreatic body, similar to the 2010 exam and likely benign or a very indolent. 5. Considerable third spacing of fluid in the subcutaneous tissues. Paucity of intra-abdominal adipose tissue making assessment for mesenteric edema problematic. 6. Scoliosis, spondylosis, and degenerative disc disease causing multilevel impingement.   Electronically Signed   By: Herbie Baltimore M.D.   On: 05/08/2014 18:22   Dg Fluoro Rm 1-60 Min  05/09/2014   CLINICAL DATA:  Small bowel obstruction  EXAM: FLOURO RM 1-60 MIN ; UNSUCCESSFUL NASOGASTRIC TUBE PLACEMENT ATTEMPT  FLUOROSCOPY TIME:  1 min, 25 seconds  COMPARISON:  Chest radiograph 05/08/2014  FINDINGS: Multiple attempts to place the nasogastric tube were unsuccessful. The tube preferentially selects the airway, or coils in the pharynx. The patient is unable to cooperate with tube placement and became combative. The use of fluoroscopy does not help in getting the tube to select the esophagus. Endoscopic placement would likely be required to successfully select the esophagus.  IMPRESSION: 1. Unsuccessful nasogastric tube placement. The  tube preferentially selects the airway or coils in the pharynx. The patient became combative and further attempts and further radiation exposure were not felt to be likely to succeed. Consider endoscopic placement.   Electronically Signed   By: Herbie Baltimore M.D.   On: 05/09/2014 17:20   Dg Chest Port 1 View  05/15/2014   CLINICAL DATA:  Chronic respiratory failure. Altered level of consciousness. Shortness of breath. Subsequent encounter.  EXAM: PORTABLE CHEST - 1 VIEW  COMPARISON:  05/14/2014; 05/08/2014; 04/22/2014  FINDINGS: Grossly unchanged cardiac silhouette and mediastinal contours with atherosclerotic plaque within the thoracic aorta. Interval development of a small right-sided effusion with unchanged small to moderate size left-sided pleural effusion. Slight worsening of bibasilar opacities, left greater than right. Mild pulmonary venous congestion without frank evidence of edema. No pneumothorax, though note, evaluation lung apices is excluded secondary to the patient's overlying chin. Regional soft tissues appear normal.  IMPRESSION: 1. Interval development of a small right-sided effusion with worsening bibasilar opacities, right greater than left, atelectasis versus infiltrate. 2. Pulmonary venous congestion without frank evidence of edema.   Electronically Signed   By: Simonne Come M.D.   On: 05/15/2014 11:32   Dg Chest Port 1 View  05/14/2014   CLINICAL DATA:  Acute respiratory failure.  EXAM: PORTABLE CHEST - 1 VIEW  COMPARISON:  05/08/2014.  FINDINGS: Stable enlarged cardiac silhouette. The previously noted large hiatal hernia appears smaller. Interval small to moderate-sized left pleural effusion and small right pleural effusion. Increased  prominence of the pulmonary vasculature and mild increase in prominence of the interstitial markings. Interval mild patchy opacity at both lung bases. Diffuse osteopenia. Nasogastric tube extending into the stomach.  IMPRESSION: 1. Interval mild changes  of congestive heart failure. 2. Stable cardiomegaly. 3. Bibasilar patchy atelectasis or pneumonia. 4. Decreased size of the previously seen large hiatal hernia.   Electronically Signed   By: Gordan Payment M.D.   On: 05/14/2014 16:14    Microbiology: Recent Results (from the past 240 hour(s))  SURGICAL PCR SCREEN     Status: None   Collection Time    05/10/14  6:42 PM      Result Value Ref Range Status   MRSA, PCR NEGATIVE  NEGATIVE Final   Staphylococcus aureus NEGATIVE  NEGATIVE Final   Comment:            The Xpert SA Assay (FDA     approved for NASAL specimens     in patients over 74 years of age),     is one component of     a comprehensive surveillance     program.  Test performance has     been validated by [facility] for patients greater     than or equal to 92 year old.     It is not intended     to diagnose infection nor to     guide or monitor treatment.  CULTURE, BLOOD (ROUTINE X 2)     Status: None   Collection Time    05/14/14  7:05 AM      Result Value Ref Range Status   Specimen Description BLOOD LEFT ARM   Final   Special Requests     Final   Value: BOTTLES DRAWN AEROBIC AND ANAEROBIC 10CC BLUE BOTTLE, 5CC RED BOTTLE   Culture  Setup Time     Final   Value: 05/14/2014 15:18     Performed at [facility]   Culture     Final   Value:        BLOOD CULTURE RECEIVED NO GROWTH TO DATE CULTURE WILL BE HELD FOR 5 DAYS BEFORE ISSUING A FINAL NEGATIVE REPORT     Note: Culture results may be compromised due to an excessive volume of blood received in culture bottles.     Performed at [facility]   Report Status PENDING   Incomplete  CULTURE, BLOOD (ROUTINE X 2)     Status: None   Collection Time    05/14/14 10:11 AM      Result Value Ref Range Status   Specimen Description BLOOD LEFT ARM   Final   Special Requests BOTTLES DRAWN AEROBIC ONLY 3CC BLUE BOTTLE   Final   Culture  Setup Time     Final   Value: 05/14/2014 18:50     Performed at [facility]   Culture     Final   Value:        BLOOD CULTURE RECEIVED NO GROWTH TO DATE CULTURE WILL BE HELD FOR 5 DAYS BEFORE ISSUING A FINAL NEGATIVE REPORT     Performed at [facility]   Report Status PENDING   Incomplete     Labs: Basic Metabolic Panel:  Recent Labs Lab 05/14/14 0705 05/15/14 0345 05/16/14 0400 05/16/14 1400 05/17/14 0725 05/18/14 0442 05/18/14 2000 05/19/14 0450  NA 137 137 136*  --  142 136*  --  134*  K 3.1* 3.4* 2.9* 2.6*  2.9* 2.9* 3.9 3.0*  CL 99 100 100  --  101 100  --  95*  CO2 23 26 26   --  26 27  --  30  GLUCOSE 114* 116* 105*  --  104* 90  --  78  BUN 23 16 15   --  10 8  --  6  CREATININE 0.95 0.62 0.64  --  0.51 0.45*  --  0.49*  CALCIUM 7.8* 7.5* 7.3*  --  7.9* 7.6*  --  7.7*  MG 1.5  --  1.6  --  1.6 1.8  --  1.8  PHOS 2.8  --  1.4*  --   --   --   --   --    Liver Function Tests: No results found for this basename: AST, ALT, ALKPHOS, BILITOT, PROT, ALBUMIN,  in the last 168 hours No results found for this basename: LIPASE, AMYLASE,  in the last 168 hours No results found for this basename: AMMONIA,  in the last 168 hours CBC:  Recent Labs Lab 05/14/14 1010 05/15/14 0345 05/16/14 0400 05/18/14 0442  WBC 16.2* 15.7* 11.9* 10.2  HGB 9.4* 10.1* 9.5* 11.1*  HCT 27.4* 29.6* 27.8* 32.3*  MCV 88.4 87.6 87.7 86.6  PLT 229 235 210 323   Cardiac Enzymes: No results found for this basename: CKTOTAL, CKMB, CKMBINDEX, TROPONINI,  in the last 168 hours BNP: BNP (last 3 results)  Recent Labs  03/22/14 1612 03/22/14 1628 05/08/14 1615  PROBNP 901.5* 893.5* 1099.0*   CBG: No results found for this basename: GLUCAP,  in the last 168 hours     Signed:  Navdeep Halt  [facility] 05/19/2014, 10:52 AM

## 2014-05-19 NOTE — Progress Notes (Signed)
Patient interviewed and examined, agree with NP note above.  Mariella SaaBenjamin T Maya Arcand MD, FACS  05/19/2014 3:28 PM

## 2014-05-19 NOTE — Discharge Instructions (Signed)
Ostomy Support Information  Yes, Theresia Majors heard that people get along just fine with only one of their eyes, or one of their lungs, or one of their kidneys. But you also know that you have only one intestine and only one bladder, and that leaves you feeling awfully empty, both physically and emotionally: You think no other people go around without part of their intestine with the ends of their intestines sticking out through their abdominal walls.  Well, you are wrong! There are nearly three quarters of a million people in the [place] who have an ostomy; people who have had surgery to remove all or part of their colons or bladders. There is even a national association, the [place] [employer] [place] with over 350 local affiliated support groups that are organized by volunteers who provide peer support and counseling. Juan Quam has a toll free telephone num-ber, ([phone] and an educational,  interactive website, www.ostomy.org   An ostomy is an opening in the belly (abdominal wall) made by surgery. Ostomates are people who have had this procedure. The opening (stoma) allows the kidney or bowel to discharge waste. An external pouch covers the stoma to collect waste. Pouches are are a simple bag and are odor free. Different companies have disposable or reusable pouches to fit one's lifestyle. An ostomy can either be temporary or permanent.  THERE ARE THREE MAIN TYPES OF OSTOMIES  Colostomy. A colostomy is a surgically created opening in the large intestine (colon).  Ileostomy. An ileostomy is a surgically created opening in the small intestine.  Urostomy. A urostomy is a surgically created opening to divert urine away from the bladder. FREQUENTLY ASKED QUESTIONS   Why havent you met any of these folks who have an ostomy?  Well, maybe you have! You just did not recognize them because an ostomy doesn't show. It can be kept secret if you wish. Why, maybe some of your best friends, office associates  or neighbors have an ostomy ... you never can tell.   People facing ostomy surgery have many quality-of-life questions like:  Will you bulge? Smell? Make noises? Will you feel waste leaving your body? Will you be a captive of the toilet? Will you starve? Be a social outcast? Get/stay married? Have babies? Easily bathe, go swimming, bend over?  OK, lets look at what you can expect:  Will you bulge?  Remember, without part of the intestine or bladder, and its contents, you should have a flatter tummy than before. You can expect to wear, with little exception, what you wore before surgery ... and this in-cludes tight clothing and bathing suits.  Will you smell?  Today, thanks to modern odor proof pouching systems, you can walk into an ostomy support group meeting and not smell anything that is foul or offensive. And, for those with an ileostomy or colostomy who are concerned about odor when emptying their pouch, there are in-pouch deodorants that can be used to eliminate any waste odors that may exist.  Will you make noises?  Everyone produces gas, especially if they are an air-swallower. But intestinal sounds that occur from time to time are no differ-ent than a gurgling tummy, and quite often your clothing will muffle any sounds.   Will you feel the waste discharges?  For those with a colostomy or ileostomy there might be a slight pressure when waste leaves your body, but understand that the intestines have no nerve endings, so there will be no unpleasant sensations. Those with a urostomy will  probably be unaware of any kidney drainage.  Will you be a captive of the toilet?  Immediately post-op you will spend more time in the bathroom than you will after your body recovers from surgery. Every person is different, but on average those with an ileostomy or urostomy may empty their pouches 4 to 6 times a day; a little  less if you have a colostomy. The average wear time between pouch system changes is 3  to 5 days and the changing process should take less than 30 minutes.  Will I need to be on a special diet? Most people return to their normal diet when they have recovered from surgery. Be sure to chew your food well, eat a well-balanced diet and drink plenty of fluids. If you experience problems with a certain food, wait a couple of weeks and try it again. Will there be odor and noises? Pouching systems are designed to be odor-proof or odor-resistant. There are deodorants that can be used in the pouch. Medications are also available to help reduce odor. Limit gas-producing foods and carbonated beverages. You will experience less gas and fewer noises as you heal from surgery. How much time will it take to care for my ostomy? At first, you may spend a lot of time learning about your ostomy and how to take care of it. As you become more comfortable and skilled at changing the pouching system, it will take very little time to care for it.  Will I be able to return to work? People with ostomies can perform most jobs. As soon as you have healed from surgery, you should be able to return to work. Heavy lifting (more than 10 pounds) may be discouraged.  What about intimacy? Sexual relationships and intimacy are important and fulfilling aspects of your life. They should continue after ostomy surgery. Intimacy-related concerns should be discussed openly between you and your partner.  Can I wear regular clothing? You do not need to wear special clothing. Ostomy pouches are fairly flat and barely noticeable. Elastic undergarments will not hurt the stoma or prevent the ostomy from functioning.  Can I participate in sports? An ostomy should not limit your involvement in sports. Many people with ostomies are runners, skiers, swimmers or participate in other active lifestyles. Talk with your caregiver first before doing heavy physical activity.  Will you starve?  Not if you follow doctors orders at each stage of  your post-op adjustment. There is no such thing as an ostomy diet. Some people with an ostomy will be able to eat and tolerate anything; others may find diffi-culty with some foods. Each person is an individual and must determine, by trial, what is best for them. A good practice for all is to drink plenty of water.  Will you be a social outcast?  Have you met anyone who has an ostomy and is a social outcast? Why should you be the first? Only your attitude and self image will effect how you are treated. No confi-dent person is an Occupational psychologist.   PROFESSIONAL HELP  Resources are available if you need help or have questions about your ostomy.    Specially trained nurses called Wound, Ostomy Continence Nurses (WOCN) are available for consultation in most major medical centers.   Consider getting an ostomy consult with Cena Benton at [facility] to help troubleshoot stoma pouch fittings and other issues with your ostomy: [phone]   The [place] (UOA) is a group made up of many  local chapters throughout the [place]. These local groups hold meetings and provide support to prospective and existing ostomates. They sponsor educational events and have qualified visitors to make personal or telephone visits. Contact the UOA for the chapter nearest you and for other educational publications.  More detailed information can be found in Colostomy Guide, a publication of the [employer] (UOA). Contact UOA at 1-480-449-7210 or visit their web site at [email or web address] The website contains links to other sites, suppliers and resources. Document Released: 07/17/2003 Document Revised: 10/06/2011 Document Reviewed: 11/15/2008 [facility] Patient Information 2013 [place].  GETTING TO GOOD BOWEL HEALTH. Irregular bowel habits such as constipation and diarrhea can lead to many problems over time.  Having one soft bowel movement a day is the most important way to  prevent further problems.  The anorectal canal is designed to handle stretching and feces to safely manage our ability to get rid of solid waste (feces, poop, stool) out of our body.  BUT, hard constipated stools can act like ripping concrete bricks and diarrhea can be a burning fire to this very sensitive area of our body, causing inflamed hemorrhoids, anal fissures, increasing risk is perirectal abscesses, abdominal pain/bloating, an making irritable bowel worse.     The goal: ONE SOFT BOWEL MOVEMENT A DAY!  To have soft, regular bowel movements:    Drink at least 8 tall glasses of water a day.     Take plenty of fiber.  Fiber is the undigested part of plant food that passes into the colon, acting s natures broom to encourage bowel motility and movement.  Fiber can absorb and hold large amounts of water. This results in a larger, bulkier stool, which is soft and easier to pass. Work gradually over several weeks up to 6 servings a day of fiber (25g a day even more if needed) in the form of: o Vegetables -- Root (potatoes, carrots, turnips), leafy green (lettuce, salad greens, celery, spinach), or cooked high residue (cabbage, broccoli, etc) o Fruit -- Fresh (unpeeled skin & pulp), Dried (prunes, apricots, cherries, etc ),  or stewed ( applesauce)  o Whole grain breads, pasta, etc (whole wheat)  o Bran cereals    Bulking Agents -- This type of water-retaining fiber generally is easily obtained each day by one of the following:  o Psyllium bran -- The psyllium plant is remarkable because its ground seeds can retain so much water. This product is available as Metamucil, Konsyl, Effersyllium, Per Diem Fiber, or the less expensive generic preparation in drug and health food stores. Although labeled a laxative, it really is not a laxative.  o Methylcellulose -- This is another fiber derived from wood which also retains water. It is available as Citrucel. o Polyethylene Glycol - and artificial fiber  commonly called Miralax or Glycolax.  It is helpful for people with gassy or bloated feelings with regular fiber o Flax Seed - a less gassy fiber than psyllium   No reading or other relaxing activity while on the toilet. If bowel movements take longer than 5 minutes, you are too constipated   AVOID CONSTIPATION.  High fiber and water intake usually takes care of this.  Sometimes a laxative is needed to stimulate more frequent bowel movements, but    Laxatives are not a good long-term solution as it can wear the colon out. o Osmotics (Milk of Magnesia, Fleets phosphosoda, Magnesium citrate, MiraLax, GoLytely) are safer than  o Stimulants (Senokot, Castor Oil, Dulcolax, Ex Lax)  o Do not take laxatives for more than 7days in a row.    IF SEVERELY CONSTIPATED, try a Bowel Retraining Program: o Do not use laxatives.  o Eat a diet high in roughage, such as bran cereals and leafy vegetables.  o Drink six (6) ounces of prune or apricot juice each morning.  o Eat two (2) large servings of stewed fruit each day.  o Take one (1) heaping tablespoon of a psyllium-based bulking agent twice a day. Use sugar-free sweetener when possible to avoid excessive calories.  o Eat a normal breakfast.  o Set aside 15 minutes after breakfast to sit on the toilet, but do not strain to have a bowel movement.  o If you do not have a bowel movement by the third day, use an enema and repeat the above steps.    Controlling diarrhea o Switch to liquids and simpler foods for a few days to avoid stressing your intestines further. o Avoid dairy products (especially milk & ice cream) for a short time.  The intestines often can lose the ability to digest lactose when stressed. o Avoid foods that cause gassiness or bloating.  Typical foods include beans and other legumes, cabbage, broccoli, and dairy foods.  Every person has some sensitivity to other foods, so listen to our body and avoid those foods that trigger problems for  you. o Adding fiber (Citrucel, Metamucil, psyllium, Miralax) gradually can help thicken stools by absorbing excess fluid and retrain the intestines to act more normally.  Slowly increase the dose over a few weeks.  Too much fiber too soon can backfire and cause cramping & bloating. o Probiotics (such as active yogurt, Align, etc) may help repopulate the intestines and colon with normal bacteria and calm down a sensitive digestive tract.  Most studies show it to be of mild help, though, and such products can be costly. o Medicines:   Bismuth subsalicylate (ex. Kayopectate, Pepto Bismol) every 30 minutes for up to 6 doses can help control diarrhea.  Avoid if pregnant.   Loperamide (Immodium) can slow down diarrhea.  Start with two tablets ($RemoveBefo'4mg'yZTaxlzKfCM$  total) first and then try one tablet every 6 hours.  Avoid if you are having fevers or severe pain.  If you are not better or start feeling worse, stop all medicines and call your doctor for advice o Call your doctor if you are getting worse or not better.  Sometimes further testing (cultures, endoscopy, X-ray studies, bloodwork, etc) may be needed to help diagnose and treat the cause of the diarrhea.  ABDOMINAL SURGERY: POST OP INSTRUCTIONS  1. DIET: Follow a light bland diet the first 24 hours after arrival home, such as soup, liquids, crackers, etc.  Be sure to include lots of fluids daily.  Avoid fast food or heavy meals as your are more likely to get nauseated.  Eat a low fat the next few days after surgery.   2. Take your usually prescribed home medications unless otherwise directed. 3. PAIN CONTROL: a. Pain is best controlled by a usual combination of three different methods TOGETHER: i. Ice/Heat ii. Over the counter pain medication iii. Prescription pain medication b. Most patients will experience some swelling and bruising around the incisions.  Ice packs or heating pads (30-60 minutes up to 6 times a day) will help. Use ice for the first few days to  help decrease swelling and bruising, then switch to heat to help relax tight/sore spots and speed recovery.  Some people prefer to use  ice alone, heat alone, alternating between ice & heat.  Experiment to what works for you.  Swelling and bruising can take several weeks to resolve.   c. It is helpful to take an over-the-counter pain medication regularly for the first few weeks.  Choose one of the following that works best for you: i. Naproxen (Aleve, etc)  Two 220mg  tabs twice a day ii. Ibuprofen (Advil, etc) Three 200mg  tabs four times a day (every meal & bedtime) iii. Acetaminophen (Tylenol, etc) 500-650mg  four times a day (every meal & bedtime) d. A  prescription for pain medication (such as oxycodone, hydrocodone, etc) should be given to you upon discharge.  Take your pain medication as prescribed.  i. If you are having problems/concerns with the prescription medicine (does not control pain, nausea, vomiting, rash, itching, etc), please call us [phone] to see if we need to switch you to a different pain medicine that will work better for you and/or control your side effect better. ii. If you need a refill on your pain medication, please contact your pharmacy.  They will contact our office to request authorization. Prescriptions will not be filled after 5 pm or on week-ends. 4. Avoid getting constipated.  Between the surgery and the pain medications, it is common to experience some constipation.  Increasing fluid intake and taking a fiber supplement (such as Metamucil, Citrucel, FiberCon, MiraLax, etc) 1-2 times a day regularly will usually help prevent this problem from occurring.  A mild laxative (prune juice, Milk of Magnesia, MiraLax, etc) should be taken according to package directions if there are no bowel movements after 48 hours.   5. Watch out for diarrhea.  If you have many loose bowel movements, simplify your diet to bland foods & liquids for a few days.  Stop any stool softeners and  decrease your fiber supplement.  Switching to mild anti-diarrheal medications (Kayopectate, Pepto Bismol) can help.  If this worsens or does not improve, please call us. 6. Wash / shower every day.  You may shower over the incision / wound.  Avoid baths until the skin is fully healed.  Continue to shower over incision(s) after the dressing is off. 7. Remove your waterproof bandages 5 days after surgery.  You may leave the incision open to air.  You may replace a dressing/Band-Aid to cover the incision for comfort if you wish. 8. ACTIVITIES as tolerated:   a. You may resume regular (light) daily activities beginning the next day--such as daily self-care, walking, climbing stairs--gradually increasing activities as tolerated.  If you can walk 30 minutes without difficulty, it is safe to try more intense activity such as jogging, treadmill, bicycling, low-impact aerobics, swimming, etc. b. Save the most intensive and strenuous activity for last such as sit-ups, heavy lifting, contact sports, etc  Refrain from any heavy lifting or straining until you are off narcotics for pain control.   c. DO NOT PUSH THROUGH PAIN.  Let pain be your guide: If it hurts to do something, don't do it.  Pain is your body warning you to avoid that activity for another week until the pain goes down. d. You may drive when you are no longer taking prescription pain medication, you can comfortably wear a seatbelt, and you can safely maneuver your car and apply brakes. e. Dennis Bast may have sexual intercourse when it is comfortable.  9. FOLLOW UP in our office a. Please call [facility] at [phone] to set up an appointment to see your surgeon in  the office for a follow-up appointment approximately 1-2 weeks after your surgery. b. Make sure that you call for this appointment the day you arrive home to insure a convenient appointment time. 10. IF YOU HAVE DISABILITY OR FAMILY LEAVE FORMS, BRING THEM TO THE OFFICE FOR PROCESSING.  DO NOT GIVE  THEM TO YOUR DOCTOR.   WHEN TO CALL us [phone]: 1. Poor pain control 2. Reactions / problems with new medications (rash/itching, nausea, etc)  3. Fever over 101.5 F (38.5 C) 4. Inability to urinate 5. Nausea and/or vomiting 6. Worsening swelling or bruising 7. Continued bleeding from incision. 8. Increased pain, redness, or drainage from the incision  The clinic staff is available to answer your questions during regular business hours (8:30am-5pm).  Please dont hesitate to call and ask to speak to one of our nurses for clinical concerns.   A surgeon from [facility] is always on call at the hospitals   If you have a medical emergency, go to the nearest emergency room or call 911.    [facility], [address], [place], [place], [place]  [address] ? MAIN: [phone] ? TOLL FREE: [phone] ? FAX [phone] [email or web address]   Managing Pain  Pain after surgery or related to activity is often due to strain/injury to muscle, tendon, nerves and/or incisions.  This pain is usually short-term and will improve in a few months.   Many people find it helpful to do the following things TOGETHER to help speed the process of healing and to get back to regular activity more quickly:  1. Avoid heavy physical activity a.  no lifting greater than 20 pounds b. Do not push through the pain.  Listen to your body and avoid positions and maneuvers than reproduce the pain c. Walking is okay as tolerated, but go slowly and stop when getting sore.  d. Remember: If it hurts to do it, then dont do it! 2. Take Anti-inflammatory medication  a. Take with food/snack around the clock for 1-2 weeks i. This helps the muscle and nerve tissues become less irritable and calm down faster b. Choose ONE of the following over-the-counter medications: i. Naproxen 220mg  tabs (ex. Aleve) 1-2 pills twice a day  ii. Ibuprofen 200mg  tabs (ex. Advil,  Motrin) 3-4 pills with every meal and just before bedtime iii. Acetaminophen 500mg  tabs (Tylenol) 1-2 pills with every meal and just before bedtime 3. Use a Heating pad or Ice/Cold Pack a. 4-6 times a day b. May use warm bath/hottub  or showers 4. Try Gentle Massage and/or Stretching  a. at the area of pain many times a day b. stop if you feel pain - do not overdo it  Try these steps together to help you body heal faster and avoid making things get worse.  Doing just one of these things may not be enough.    If you are not getting better after two weeks or are noticing you are getting worse, contact our office for further advice; we may need to re-evaluate you & see what other things we can do to help.  Exercise to Stay Healthy Exercise helps you become and stay healthy. EXERCISE IDEAS AND TIPS Choose exercises that:  You enjoy.  Fit into your day. You do not need to exercise really hard to be healthy. You can do exercises at a slow or medium level and stay healthy. You can:  Stretch before and after working out.  Try yoga, Pilates, or  tai chi.  Lift weights.  Walk fast, swim, jog, run, climb stairs, bicycle, dance, or rollerskate.  Take aerobic classes. Exercises that burn about 150 calories:  Running 1  miles in 15 minutes.  Playing volleyball for 45 to 60 minutes.  Washing and waxing a car for 45 to 60 minutes.  Playing touch football for 45 minutes.  Walking 1  miles in 35 minutes.  Pushing a stroller 1  miles in 30 minutes.  Playing basketball for 30 minutes.  Raking leaves for 30 minutes.  Bicycling 5 miles in 30 minutes.  Walking 2 miles in 30 minutes.  Dancing for 30 minutes.  Shoveling snow for 15 minutes.  Swimming laps for 20 minutes.  Walking up stairs for 15 minutes.  Bicycling 4 miles in 15 minutes.  Gardening for 30 to 45 minutes.  Jumping rope for 15 minutes.  Washing windows or floors for 45 to 60 minutes. Document Released:  08/16/2010 Document Revised: 10/06/2011 Document Reviewed: 08/16/2010 [facility] Patient Information 2015 [place], [place]. This information is not intended to replace advice given to you by your health care provider. Make sure you discuss any questions you have with your health care provider.  Dementia Dementia is a general term for problems with brain function. A person with dementia has memory loss and a hard time with at least one other brain function such as thinking, speaking, or problem solving. Dementia can affect social functioning, how you do your job, your mood, or your personality. The changes may be hidden for a long time. The earliest forms of this disease are usually not detected by family or friends. Dementia can be:  Irreversible.  Potentially reversible.  Partially reversible.  Progressive. This means it can get worse over time. CAUSES  Irreversible dementia causes may include:  Degeneration of brain cells (Alzheimer disease or Lewy body dementia).  Multiple small strokes (vascular dementia).  Infection (chronic meningitis or Creutzfeldt-Jakob disease).  Frontotemporal dementia. This affects younger people, age 28 to 44, compared to those who have Alzheimer disease.  Dementia associated with other disorders like Parkinson disease, Huntington disease, or HIV-associated dementia. Potentially or partially reversible dementia causes may include:  Medicines.  Metabolic causes such as excessive alcohol intake, vitamin B12 deficiency, or thyroid disease.  Masses or pressure in the brain such as a tumor, blood clot, or hydrocephalus. SIGNS AND SYMPTOMS  Symptoms are often hard to detect. Family members or coworkers may not notice them early in the disease process. Different people with dementia may have different symptoms. Symptoms can include:  A hard time with memory, especially recent memory. Long-term memory may not be impaired.  Asking the same question multiple  times or forgetting something someone just said.  A hard time speaking your thoughts or finding certain words.  A hard time solving problems or performing familiar tasks (such as how to use a telephone).  Sudden changes in mood.  Changes in personality, especially increasing moodiness or mistrust.  Depression.  A hard time understanding complex ideas that were never a problem in the past. DIAGNOSIS  There are no specific tests for dementia.   Your health care provider may recommend a thorough evaluation. This is because some forms of dementia can be reversible. The evaluation will likely include a physical exam and getting a detailed history from you and a family member. The history often gives the best clues and suggestions for a diagnosis.  Memory testing may be done. A detailed brain function evaluation called neuropsychologic testing may  be helpful.  Lab tests and brain imaging (such as a CT scan or MRI scan) are sometimes important.  Sometimes observation and re-evaluation over time is very helpful. TREATMENT  Treatment depends on the cause.   If the problem is a vitamin deficiency, it may be helped or cured with supplements.  For dementias such as Alzheimer disease, medicines are available to stabilize or slow the course of the disease. There are no cures for this type of dementia.  Your health care provider can help direct you to groups, organizations, and other health care providers to help with decisions in the care of you or your loved one. HOME CARE INSTRUCTIONS The care of individuals with dementia is varied and dependent upon the progression of the dementia. The following suggestions are intended for the person living with, or caring for, the person with dementia.  Create a safe environment.  Remove the locks on bathroom doors to prevent the person from accidentally locking himself or herself in.  Use childproof latches on kitchen cabinets and any place where  cleaning supplies, chemicals, or alcohol are kept.  Use childproof covers in unused electrical outlets.  Install childproof devices to keep doors and windows secured.  Remove stove knobs or install safety knobs and an automatic shut-off on the stove.  Lower the temperature on water heaters.  Label medicines and keep them locked up.  Secure knives, lighters, matches, power tools, and guns, and keep these items out of reach.  Keep the house free from clutter. Remove rugs or anything that might contribute to a fall.  Remove objects that might break and hurt the person.  Make sure lighting is good, both inside and outside.  Install grab rails as needed.  Use a monitoring device to alert you to falls or other needs for help.  Reduce confusion.  Keep familiar objects and people around.  Use night lights or dim lights at night.  Label items or areas.  Use reminders, notes, or directions for daily activities or tasks.  Keep a simple, consistent routine for waking, meals, bathing, dressing, and bedtime.  Create a calm, quiet environment.  Place large clocks and calendars prominently.  Display emergency numbers and home address near all telephones.  Use cues to establish different times of the day. An example is to open curtains to let the natural light in during the day.   Use effective communication.  Choose simple words and short sentences.  Use a gentle, calm tone of voice.  Be careful not to interrupt.  If the person is struggling to find a word or communicate a thought, try to provide the word or thought.  Ask one question at a time. Allow the person ample time to answer questions. Repeat the question again if the person does not respond.  Reduce nighttime restlessness.  Provide a comfortable bed.  Have a consistent nighttime routine.  Ensure a regular walking or physical activity schedule. Involve the person in daily activities as much as possible.  Limit  napping during the day.  Limit caffeine.  Attend social events that stimulate rather than overwhelm the senses.  Encourage good nutrition and hydration.  Reduce distractions during meal times and snacks.  Avoid foods that are too hot or too cold.  Monitor chewing and swallowing ability.  Continue with routine vision, hearing, dental, and medical screenings.  Give medicines only as directed by the health care provider.  Monitor driving abilities. Do not allow the person to drive when safe driving is  no longer possible.  Register with an identification program which could provide location assistance in the event of a missing person situation. SEEK MEDICAL CARE IF:   New behavioral problems start such as moodiness, aggressiveness, or seeing things that are not there (hallucinations).  Any new problem with brain function happens. This includes problems with balance, speech, or falling a lot.  Problems with swallowing develop.  Any symptoms of other illness happen. Small changes or worsening in any aspect of brain function can be a sign that the illness is getting worse. It can also be a sign of another medical illness such as infection. Seeing a health care provider right away is important. SEEK IMMEDIATE MEDICAL CARE IF:   A fever develops.  New or worsened confusion develops.  New or worsened sleepiness develops.  Staying awake becomes hard to do. Document Released: 01/07/2001 Document Revised: 11/28/2013 Document Reviewed: 12/09/2010 [employer] Patient Information 2015 [place], [place]. This information is not intended to replace advice given to you by your health care provider. Make sure you discuss any questions you have with your health care provider.   ABDOMINAL SURGERY: POST OP INSTRUCTIONS  10. DIET: Follow a light bland diet the first 24 hours after arrival home, such as soup, liquids, crackers, etc.  Be sure to include lots of fluids daily.  Avoid fast food or heavy  meals as your are more likely to get nauseated.  Eat a low fat the next few days after surgery.   11. Take your usually prescribed home medications unless otherwise directed. 12. PAIN CONTROL: a. Pain is best controlled by a usual combination of three different methods TOGETHER: i. Ice/Heat ii. Over the counter pain medication iii. Prescription pain medication b. Most patients will experience some swelling and bruising around the incisions.  Ice packs or heating pads (30-60 minutes up to 6 times a day) will help. Use ice for the first few days to help decrease swelling and bruising, then switch to heat to help relax tight/sore spots and speed recovery.  Some people prefer to use ice alone, heat alone, alternating between ice & heat.  Experiment to what works for you.  Swelling and bruising can take several weeks to resolve.   c. It is helpful to take an over-the-counter pain medication regularly for the first few weeks.  Choose one of the following that works best for you: i. Naproxen (Aleve, etc)  Two 220mg  tabs twice a day ii. Ibuprofen (Advil, etc) Three 200mg  tabs four times a day (every meal & bedtime) iii. Acetaminophen (Tylenol, etc) 500-650mg  four times a day (every meal & bedtime) d. A  prescription for pain medication (such as oxycodone, hydrocodone, etc) should be given to you upon discharge.  Take your pain medication as prescribed.  i. If you are having problems/concerns with the prescription medicine (does not control pain, nausea, vomiting, rash, itching, etc), please call us [phone] to see if we need to switch you to a different pain medicine that will work better for you and/or control your side effect better. ii. If you need a refill on your pain medication, please contact your pharmacy.  They will contact our office to request authorization. Prescriptions will not be filled after 5 pm or on week-ends. 13. Avoid getting constipated.  Between the surgery and the pain  medications, it is common to experience some constipation.  Increasing fluid intake and taking a fiber supplement (such as Metamucil, Citrucel, FiberCon, MiraLax, etc) 1-2 times a day regularly will usually  help prevent this problem from occurring.  A mild laxative (prune juice, Milk of Magnesia, MiraLax, etc) should be taken according to package directions if there are no bowel movements after 48 hours.   14. Watch out for diarrhea.  If you have many loose bowel movements, simplify your diet to bland foods & liquids for a few days.  Stop any stool softeners and decrease your fiber supplement.  Switching to mild anti-diarrheal medications (Kayopectate, Pepto Bismol) can help.  If this worsens or does not improve, please call us. 15. Wash / shower every day.  You may shower over the incision / wound.  Avoid baths until the skin is fully healed.  Continue to shower over incision(s) after the dressing is off. 16. Remove your waterproof bandages 5 days after surgery.  You may leave the incision open to air.  You may replace a dressing/Band-Aid to cover the incision for comfort if you wish. 17. ACTIVITIES as tolerated:   a. You may resume regular (light) daily activities beginning the next day--such as daily self-care, walking, climbing stairs--gradually increasing activities as tolerated.  If you can walk 30 minutes without difficulty, it is safe to try more intense activity such as jogging, treadmill, bicycling, low-impact aerobics, swimming, etc. b. Save the most intensive and strenuous activity for last such as sit-ups, heavy lifting, contact sports, etc  Refrain from any heavy lifting or straining until you are off narcotics for pain control.   c. DO NOT PUSH THROUGH PAIN.  Let pain be your guide: If it hurts to do something, don't do it.  Pain is your body warning you to avoid that activity for another week until the pain goes down. d. You may drive when you are no longer taking prescription pain  medication, you can comfortably wear a seatbelt, and you can safely maneuver your car and apply brakes. e. Dennis Bast may have sexual intercourse when it is comfortable.  18. FOLLOW UP in our office a. Please call [facility] at [phone] to set up an appointment to see your surgeon in the office for a follow-up appointment approximately 1-2 weeks after your surgery. b. Make sure that you call for this appointment the day you arrive home to insure a convenient appointment time. 10. IF YOU HAVE DISABILITY OR FAMILY LEAVE FORMS, BRING THEM TO THE OFFICE FOR PROCESSING.  DO NOT GIVE THEM TO YOUR DOCTOR.   WHEN TO CALL us [phone]: 9. Poor pain control 10. Reactions / problems with new medications (rash/itching, nausea, etc)  11. Fever over 101.5 F (38.5 C) 12. Inability to urinate 13. Nausea and/or vomiting 14. Worsening swelling or bruising 15. Continued bleeding from incision. 16. Increased pain, redness, or drainage from the incision  The clinic staff is available to answer your questions during regular business hours (8:30am-5pm).  Please dont hesitate to call and ask to speak to one of our nurses for clinical concerns.   A surgeon from [facility] is always on call at the hospitals   If you have a medical emergency, go to the nearest emergency room or call 911.    [facility], [address], [place], [place], [place]  [address] ? MAIN: [phone] ? TOLL FREE: [phone] ? FAX [phone] [email or web address]

## 2014-05-19 NOTE — Consult Note (Signed)
WOC ostomy follow up Stoma type/location: LLQ Colostomy Stomal assessment/size: 2 and 1/4 inches, round, edematous, budded Peristomal assessment: Not seen today Treatment options for stomal/peristomal skin: Skin barrier ring Output Brown liquid stool Ostomy pouching: 1pc. Pouch with skin barrier ring  Education provided: None today as patient is working with student nurses and is still mildly confused.  Plan is for discharge to a SNF/Rehab Center today.  Supplies in room to last for 2 weeks for her to take with her when discharged. This should enable the facility sufficient time to secure the patient's required supplies. [place] [facility] [place] [facility]: No WOC nursing team will remain remain available to this patient, the nursing, surgical and medical teams.  Thanks, Ladona MowLaurie Naif Alabi, MSN, RN, GNP, [place], CWON-AP [phone])

## 2014-05-19 NOTE — Progress Notes (Signed)
PT Cancellation Note  ___Treatment cancelled today due to medical issues with patient which prohibited therapy  ___ Treatment cancelled today due to patient receiving procedure or test   _X_ Treatment cancelled today due to patient's refusal to participate .   Pt requested to sleep and be left alone.  ___ Treatment cancelled today due to   Felecia ShellingLori Jaeanna Mccomber  PTA [facility]  Acute  Rehab Pager      [phone]

## 2014-05-19 NOTE — Progress Notes (Signed)
Patient ID: [name], female   DOB: 30-Aug-1925, 80 y.o.   MRN: [id number]     [facility]      [address] [place], [place] [address]    Phone: [phone] FAX: [phone]     Subjective: No issues.  Tolerating PO.  Afebrile.  VSS.  States she's ready to go to SNF  Objective:  Vital signs:  Filed Vitals:   05/18/14 0506 05/18/14 1422 05/18/14 1502 05/18/14 2030  BP: 121/71 101/43 105/71 106/63  Pulse: 88 87 87 94  Temp:  97.9 F (36.6 C) 97.2 F (36.2 C) 97.8 F (36.6 C)  TempSrc: Oral  Oral Oral  Resp: _0 Height:      Weight:      SpO2: 96% 97% 92% 91%    Last BM Date: 05/19/14  Intake/Output   Yesterday:  10/22 0701 - 10/23 0700 In: 750 [IV Piggyback:750] Out: 1941 [Urine:3350; Stool:375] This shift: I/O last 3 completed shifts: In: 750 [IV Piggyback:750] Out: 7408 [Urine:3350; Stool:475]    Physical Exam:  General: Pt awake/alert/oriented, acute distress  Abdomen: Soft. Nondistended. Midline wound is c/d/i. LLQ ostomy is pink, swollen, viable,semi formed stool in ostomy. No evidence of peritonitis. No incarcerated hernias.    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 Active Problems:   Hypokalemia   Diverticulosis of sigmoid colon   Fibromyalgia   HOH (hard of hearing)   Dementia   Shock circulatory   Atrial fibrillation with RVR   Malnutrition of moderate degree    Results:   Labs: Results for orders placed during the hospital encounter of 05/08/14 (from the past 48 hour(s))  VANCOMYCIN, TROUGH     Status: None   Collection Time    05/17/14 10:50 AM      Result Value Ref Range   Vancomycin Tr 12.4  10.0 - 20.0 ug/mL  BASIC METABOLIC PANEL     Status: Abnormal   Collection Time    05/18/14  4:42 AM      Result Value Ref Range   Sodium 136 (*) 137 - 147 mEq/L   Potassium 2.9 (*) 3.7 - 5.3  mEq/L   Comment: CRITICAL RESULT CALLED TO, READ BACK BY AND VERIFIED WITH:     T. BENJAMIN RN AT 0700 ON 10.22.15 BY SHUEA   Chloride 100  96 - 112 mEq/L   CO2 27  19 - 32 mEq/L   Glucose, Bld 90  70 - 99 mg/dL   BUN 8  6 - 23 mg/dL   Creatinine, Ser 0.45 (*) 0.50 - 1.10 mg/dL   Calcium 7.6 (*) 8.4 - 10.5 mg/dL   GFR calc non Af Amer 88 (*) >90 mL/min   GFR calc Af Amer >90  >90 mL/min   Comment: (NOTE)     The eGFR has been calculated using the CKD EPI equation.     This calculation has not been validated in all clinical situations.     eGFR's persistently <90 mL/min signify possible Chronic Kidney     Disease.   Anion gap 9  5 - 15  CBC     Status: Abnormal   Collection Time    05/18/14  4:42 AM      Result Value Ref Range   WBC 10.2  4.0 - 10.5 K/uL   RBC 3.73 (*) 3.87 - 5.11 MIL/uL   Hemoglobin  11.1 (*) 12.0 - 15.0 g/dL   HCT 32.3 (*) 36.0 - 46.0 %   MCV 86.6  78.0 - 100.0 fL   MCH 29.8  26.0 - 34.0 pg   MCHC 34.4  30.0 - 36.0 g/dL   RDW 15.2  11.5 - 15.5 %   Platelets 323  150 - 400 K/uL  MAGNESIUM     Status: None   Collection Time    05/18/14  4:42 AM      Result Value Ref Range   Magnesium 1.8  1.5 - 2.5 mg/dL  POTASSIUM     Status: None   Collection Time    05/18/14  8:00 PM      Result Value Ref Range   Potassium 3.9  3.7 - 5.3 mEq/L   Comment: DELTA CHECK NOTED  BASIC METABOLIC PANEL     Status: Abnormal   Collection Time    05/19/14  4:50 AM      Result Value Ref Range   Sodium 134 (*) 137 - 147 mEq/L   Potassium 3.0 (*) 3.7 - 5.3 mEq/L   Comment: DELTA CHECK NOTED   Chloride 95 (*) 96 - 112 mEq/L   CO2 30  19 - 32 mEq/L   Glucose, Bld 78  70 - 99 mg/dL   BUN 6  6 - 23 mg/dL   Creatinine, Ser 0.49 (*) 0.50 - 1.10 mg/dL   Calcium 7.7 (*) 8.4 - 10.5 mg/dL   GFR calc non Af Amer 86 (*) >90 mL/min   GFR calc Af Amer >90  >90 mL/min   Comment: (NOTE)     The eGFR has been calculated using the CKD EPI equation.     This calculation has not been  validated in all clinical situations.     eGFR's persistently <90 mL/min signify possible Chronic Kidney     Disease.   Anion gap 9  5 - 15  MAGNESIUM     Status: None   Collection Time    05/19/14  4:50 AM      Result Value Ref Range   Magnesium 1.8  1.5 - 2.5 mg/dL    Imaging / Studies: No results found.  Medications / Allergies:  Scheduled Meds: . amiodarone  200 mg Oral BID  . antiseptic oral rinse  7 mL Mouth Rinse q12n4p  . antiseptic oral rinse  7 mL Mouth Rinse BID  . chlorhexidine  15 mL Mouth Rinse BID  . feeding supplement (RESOURCE BREEZE)  1 Container Oral BID BM  . heparin  5,000 Units Subcutaneous 3 times per day  . Influenza vac split quadrivalent PF  0.5 mL Intramuscular Tomorrow-1000  . lactated ringers  1,000 mL Intravenous Once  . levothyroxine  100 mcg Oral Once per day on Mon Fri  . levothyroxine  112 mcg Oral Once per day on Sun Tue Wed Thu Sat  . lip balm  1 application Topical BID  . potassium chloride  40 mEq Oral Q4H  . sodium chloride  500 mL Intravenous Once  . sodium chloride  10-40 mL Intracatheter Q12H   Continuous Infusions: . bupivacaine 0.25 % ON-Q pump DUAL CATH 300 mL Stopped (05/17/14 0756)   PRN Meds:.acetaminophen, acetaminophen, alum & mag hydroxide-simeth, diphenhydrAMINE, magic mouthwash, menthol-cetylpyridinium, ondansetron (ZOFRAN) IV, ondansetron (ZOFRAN) IV, phenol, promethazine, sodium chloride  Antibiotics: Anti-infectives   Start     Dose/Rate Route Frequency Ordered Stop   05/17/14 1800  vancomycin (VANCOCIN) IVPB 750 mg/150 ml premix  Status:  Discontinued     750 mg 150 mL/hr over 60 Minutes Intravenous Every 12 hours 05/17/14 1726 05/18/14 1445   05/15/14 0600  vancomycin (VANCOCIN) 500 mg in sodium chloride 0.9 % 100 mL IVPB  Status:  Discontinued     500 mg 100 mL/hr over 60 Minutes Intravenous Every 12 hours 05/14/14 1602 05/17/14 1726   05/14/14 1630  piperacillin-tazobactam (ZOSYN) IVPB 3.375 g  Status:   Discontinued     3.375 g 12.5 mL/hr over 240 Minutes Intravenous 3 times per day 05/14/14 1602 05/18/14 1445   05/14/14 1630  vancomycin (VANCOCIN) IVPB 1000 mg/200 mL premix     1,000 mg 200 mL/hr over 60 Minutes Intravenous  Once 05/14/14 1602 05/14/14 1751   05/11/14 0600  cefoTEtan (CEFOTAN) 2 g in dextrose 5 % 50 mL IVPB  Status:  Discontinued     2 g 100 mL/hr over 30 Minutes Intravenous On call to O.R. 05/10/14 1512 05/12/14 1637   05/09/14 0215  cefTRIAXone (ROCEPHIN) 1 g in dextrose 5 % 50 mL IVPB  Status:  Discontinued     1 g 100 mL/hr over 30 Minutes Intravenous Daily at bedtime 05/09/14 0203 05/14/14 1536       Assessment/Plan  POD#7 exploratory laparotomy, sigmoid colectomy, LOA, serosal repair, colostomy---Dr. Johney [place] 05/12/14  -colostomy is functioning, tolerating PO, pain well controlled.  Stable for discharge to SNF from surgical standpoint. -follow up has been arranged -further management per primary team.    Erby Pian, ANP-BC [facility] Pager [phone])   05/19/2014 9:19 AM

## 2014-05-19 NOTE — Progress Notes (Signed)
[facility] My contact number was given if any further questions arise.  Will continue to monitor patient until discharge.

## 2014-05-19 NOTE — Progress Notes (Signed)
OT Cancellation Note  Patient Details Name: Adelina Mingsellie M Freiermuth MRN: [id number] DOB: June 21, 1926   Cancelled Treatment:    Reason Eval/Treat Not Completed: Other (comment) Note pt may be possibly d/c to SNF today and PT just attempted but pt refused and wanted to sleep.  Lennox LaityStone, Willard Madrigal Stafford [phone] 05/19/2014, 11:45 AM

## 2014-05-20 LAB — CULTURE, BLOOD (ROUTINE X 2)
Culture: NO GROWTH
Culture: NO GROWTH

## 2014-05-22 DIAGNOSIS — I4891 Unspecified atrial fibrillation: Secondary | ICD-10-CM | POA: Diagnosis not present

## 2014-05-22 DIAGNOSIS — R601 Generalized edema: Secondary | ICD-10-CM | POA: Diagnosis not present

## 2014-05-22 DIAGNOSIS — E876 Hypokalemia: Secondary | ICD-10-CM | POA: Diagnosis not present

## 2014-05-23 DIAGNOSIS — E440 Moderate protein-calorie malnutrition: Secondary | ICD-10-CM | POA: Diagnosis not present

## 2014-05-23 DIAGNOSIS — E039 Hypothyroidism, unspecified: Secondary | ICD-10-CM | POA: Diagnosis not present

## 2014-05-23 DIAGNOSIS — E876 Hypokalemia: Secondary | ICD-10-CM | POA: Diagnosis not present

## 2014-05-23 DIAGNOSIS — I480 Paroxysmal atrial fibrillation: Secondary | ICD-10-CM | POA: Diagnosis not present

## 2014-05-23 DIAGNOSIS — M797 Fibromyalgia: Secondary | ICD-10-CM | POA: Diagnosis not present

## 2014-06-06 ENCOUNTER — Encounter: Payer: Self-pay | Admitting: *Deleted

## 2014-06-07 DIAGNOSIS — H109 Unspecified conjunctivitis: Secondary | ICD-10-CM | POA: Diagnosis not present

## 2014-06-07 DIAGNOSIS — R601 Generalized edema: Secondary | ICD-10-CM | POA: Diagnosis not present

## 2014-06-07 DIAGNOSIS — K9409 Other complications of colostomy: Secondary | ICD-10-CM | POA: Diagnosis not present

## 2014-06-08 ENCOUNTER — Encounter: Payer: BC Managed Care – PPO | Admitting: Physician Assistant

## 2014-06-09 DIAGNOSIS — L259 Unspecified contact dermatitis, unspecified cause: Secondary | ICD-10-CM | POA: Diagnosis not present

## 2014-06-09 DIAGNOSIS — R601 Generalized edema: Secondary | ICD-10-CM | POA: Diagnosis not present

## 2014-06-09 DIAGNOSIS — H109 Unspecified conjunctivitis: Secondary | ICD-10-CM | POA: Diagnosis not present

## 2014-06-09 DIAGNOSIS — K9409 Other complications of colostomy: Secondary | ICD-10-CM | POA: Diagnosis not present

## 2014-06-12 DIAGNOSIS — H109 Unspecified conjunctivitis: Secondary | ICD-10-CM | POA: Diagnosis not present

## 2014-06-12 DIAGNOSIS — R609 Edema, unspecified: Secondary | ICD-10-CM | POA: Diagnosis not present

## 2014-06-12 DIAGNOSIS — R21 Rash and other nonspecific skin eruption: Secondary | ICD-10-CM | POA: Diagnosis not present

## 2014-06-12 DIAGNOSIS — E871 Hypo-osmolality and hyponatremia: Secondary | ICD-10-CM | POA: Diagnosis not present

## 2014-06-13 DIAGNOSIS — R21 Rash and other nonspecific skin eruption: Secondary | ICD-10-CM | POA: Diagnosis not present

## 2014-06-13 DIAGNOSIS — E871 Hypo-osmolality and hyponatremia: Secondary | ICD-10-CM | POA: Diagnosis not present

## 2014-06-13 DIAGNOSIS — H109 Unspecified conjunctivitis: Secondary | ICD-10-CM | POA: Diagnosis not present

## 2014-06-13 DIAGNOSIS — F0391 Unspecified dementia with behavioral disturbance: Secondary | ICD-10-CM | POA: Diagnosis not present

## 2014-06-14 DIAGNOSIS — E871 Hypo-osmolality and hyponatremia: Secondary | ICD-10-CM | POA: Diagnosis not present

## 2014-06-14 DIAGNOSIS — R21 Rash and other nonspecific skin eruption: Secondary | ICD-10-CM | POA: Diagnosis not present

## 2014-06-14 DIAGNOSIS — B029 Zoster without complications: Secondary | ICD-10-CM | POA: Diagnosis not present

## 2014-06-14 DIAGNOSIS — B0239 Other herpes zoster eye disease: Secondary | ICD-10-CM | POA: Diagnosis not present

## 2014-06-14 DIAGNOSIS — H109 Unspecified conjunctivitis: Secondary | ICD-10-CM | POA: Diagnosis not present

## 2014-06-15 DIAGNOSIS — K9402 Colostomy infection: Secondary | ICD-10-CM | POA: Diagnosis not present

## 2014-06-15 DIAGNOSIS — H109 Unspecified conjunctivitis: Secondary | ICD-10-CM | POA: Diagnosis not present

## 2014-06-15 DIAGNOSIS — B029 Zoster without complications: Secondary | ICD-10-CM | POA: Diagnosis not present

## 2014-06-15 DIAGNOSIS — E871 Hypo-osmolality and hyponatremia: Secondary | ICD-10-CM | POA: Diagnosis not present

## 2014-06-21 DIAGNOSIS — B0239 Other herpes zoster eye disease: Secondary | ICD-10-CM | POA: Diagnosis not present

## 2014-06-30 DIAGNOSIS — E039 Hypothyroidism, unspecified: Secondary | ICD-10-CM | POA: Diagnosis not present

## 2014-06-30 DIAGNOSIS — E46 Unspecified protein-calorie malnutrition: Secondary | ICD-10-CM | POA: Diagnosis not present

## 2014-06-30 DIAGNOSIS — F0391 Unspecified dementia with behavioral disturbance: Secondary | ICD-10-CM | POA: Diagnosis not present

## 2014-06-30 DIAGNOSIS — I4891 Unspecified atrial fibrillation: Secondary | ICD-10-CM | POA: Diagnosis not present

## 2014-06-30 DIAGNOSIS — M797 Fibromyalgia: Secondary | ICD-10-CM | POA: Diagnosis not present

## 2014-07-04 ENCOUNTER — Ambulatory Visit (INDEPENDENT_AMBULATORY_CARE_PROVIDER_SITE_OTHER): Payer: Medicare Other | Admitting: Physician Assistant

## 2014-07-04 ENCOUNTER — Encounter: Payer: BC Managed Care – PPO | Admitting: Physician Assistant

## 2014-07-04 ENCOUNTER — Encounter: Payer: Self-pay | Admitting: Physician Assistant

## 2014-07-04 VITALS — BP 120/60 | HR 75 | Ht 62.0 in | Wt 123.0 lb

## 2014-07-04 DIAGNOSIS — I480 Paroxysmal atrial fibrillation: Secondary | ICD-10-CM | POA: Diagnosis not present

## 2014-07-04 DIAGNOSIS — Z66 Do not resuscitate: Secondary | ICD-10-CM | POA: Diagnosis not present

## 2014-07-04 NOTE — Progress Notes (Signed)
Cardiology Office Note   Date:  07/04/2014   ID:  [name], DOB 1925/07/31, MRN [id number]  PCP:   Duane Lopeoss, Alan, MD  Cardiologist:  Dr. Donato SchultzMark Skains      History of Present Illness: [name] is a 80 y.o. female was recently admitted 10/12-10/23 with colon obstruction. She was taken to the operating room with lysis of adhesions, sigmoid colectomy with end colostomy. Hospital course was complicated by hypotension, delirium, UTI, respiratory failure and new onset atrial fibrillation with electrolyte imbalance and converted to NSR with amiodarone. She was seen by cardiology. Echocardiogram demonstrated normal LV function.  She was not felt to be a candidate for anticoagulation because of her dementia.  Because her atrial fibrillation occurred in the setting of an acute illness, it was felt that she could probably eventually come off of amiodarone. She was discharged to SNF. She returns for follow-up.  [facility]. She is doing well. She is significantly hard of hearing. She is here with her friend today. She denies chest pain, significant dyspnea, syncope, palpitations.   Studies:   - Echo (10/15):  Mild LVH, EF 50-55%, normal wall motion, trivial AI, mild to moderate MAC   Recent Labs: 05/08/2014: Pro B Natriuretic peptide (BNP) 1099.0* 05/09/2014: ALT 17 05/14/2014: TSH 3.920 05/18/2014: Hemoglobin 11.1* 05/19/2014: BUN 6; Creatinine 0.49*; Potassium 3.0*; Sodium 134*    Recent Radiology: No results found.    Wt Readings from Last 3 Encounters:  07/04/14 123 lb (55.792 kg)  05/17/14 150 lb 5.7 oz (68.2 kg)  09/22/12 146 lb 9.7 oz (66.5 kg)     Past Medical History  Diagnosis Date  . Arthritis   . Fibromyalgia   . Diverticulosis   . Hypothyroidism   . Osteoarthritis   . DJD (degenerative joint disease)   . Diverticulitis 07/01/2011  . Diverticulosis of sigmoid colon 05/09/2014    Current Outpatient Prescriptions  Medication Sig  Dispense Refill  . Alum & Mag Hydroxide-Simeth (MAGIC MOUTHWASH) SOLN Take 15 mLs by mouth 4 (four) times daily as needed for mouth pain (sore throat). 120 mL 0  . amiodarone (PACERONE) 200 MG tablet Take 1 tablet (200 mg total) by mouth daily. 30 tablet 0  . cholecalciferol (VITAMIN D) 1000 UNITS tablet Take 1,000 Units by mouth daily.    . diphenhydrAMINE (BENADRYL) 25 mg capsule Take 25 mg by mouth every 6 (six) hours as needed.    . feeding supplement, RESOURCE BREEZE, (RESOURCE BREEZE) LIQD Take 1 Container by mouth 2 (two) times daily between meals. 60 Container 0  . hydrALAZINE (APRESOLINE) 10 MG tablet Take 10 mg by mouth 2 (two) times daily.    . hydrOXYzine (ATARAX/VISTARIL) 25 MG tablet Take 25 mg by mouth every 6 (six) hours as needed.    Marland Kitchen. levothyroxine (SYNTHROID, LEVOTHROID) 112 MCG tablet Take 112 mcg by mouth daily before breakfast.    . loperamide (IMODIUM A-D) 2 MG tablet Take 2 mg by mouth 4 (four) times daily as needed for diarrhea or loose stools.    Marland Kitchen. LORazepam (ATIVAN) 0.5 MG tablet Take 0.5 mg by mouth 2 (two) times daily as needed for anxiety.    . magnesium oxide (MAG-OX) 400 MG tablet Take 400 mg by mouth daily.    . nabumetone (RELAFEN) 750 MG tablet Take 750 mg by mouth as needed for mild pain (as directed.).     Marland Kitchen. Neomycin-Polymyxin-Dexameth (MAXITROL OP) Apply to eye as directed. TO LEFT EYE X  1 WEEK STARTING 06/29/14 PER SNF    . polyethylene glycol (MIRALAX / GLYCOLAX) packet Take 17 g by mouth daily as needed for mild constipation.     . potassium chloride SA (K-DUR,KLOR-CON) 20 MEQ tablet Take 3 tablets (60 mEq total) by mouth 2 (two) times daily. 20 tablet 0  . QUEtiapine (SEROQUEL) 25 MG tablet Take 25 mg by mouth at bedtime as needed.    . simethicone (MYLICON) 80 MG chewable tablet Chew 80 mg by mouth 2 (two) times daily.    . traMADol (ULTRAM) 50 MG tablet Take 1-2 tablets (50-100 mg total) by mouth every 6 (six) hours as needed for moderate pain. 40 tablet 0   . Vitamin D, Ergocalciferol, (DRISDOL) 50000 UNITS CAPS capsule Take 50,000 Units by mouth every 7 (seven) days.     No current facility-administered medications for this visit.     Allergies:   Antihistamines, chlorpheniramine-type and Codeine   Social History:  The patient  reports that she quit smoking about 30 years ago. Her smoking use included Cigarettes. She smoked 0.00 packs per day. She has never used smokeless tobacco. She reports that she drinks alcohol. She reports that she does not use illicit drugs.   Family History:  The patient's family history includes Prostate cancer in an other family member. There is no history of Heart attack or Stroke.    ROS:  Please see the history of present illness.       All other systems reviewed and negative.    PHYSICAL EXAM: VS:  BP 120/60 mmHg  Pulse 75  Ht 5\' 2"  (1.575 m)  Wt 123 lb (55.792 kg)  BMI 22.49 kg/m2 Well nourished, well developed, in no acute distress HEENT: normal Neck: no JVD Cardiac:  normal S1, S2;  RRR; 2/6 systolic murmur RUSB Lungs:   clear to auscultation bilaterally, no wheezing, rhonchi or rales Abd: soft, nontender, no hepatomegaly Ext: no edema Skin: warm and dry Neuro:  CNs 2-12 intact, no focal abnormalities noted  EKG:  NSR, HR 75, normal axis, NSSTTW changes      ASSESSMENT AND PLAN:  1.  Paroxysmal atrial fibrillation:  This occurred in the setting of an acute illness. She has remained in sinus rhythm on amiodarone for over 6 weeks now. She is not a candidate for long-term anticoagulation given dementia. She remains in NSR today.    -  DC amiodarone. 2.  DNR (do not resuscitate)   Disposition:   FU with Dr. Donato SchultzMark Skains 6 mos.   Signed, Brynda RimScott Alfie Alderfer, PA-C, MHS 07/04/2014 3:44 PM    [facility] [address], [place], [place]  [address] Phone: [phone]; Fax: [phone]

## 2014-07-04 NOTE — Patient Instructions (Signed)
Your physician has recommended you make the following change in your medication:  STOP taking Amiodarone.  Your physician recommends that you schedule a follow-up appointment in:  Dr. Donato SchultzMark Skains in 6 months.

## 2014-07-19 DIAGNOSIS — R339 Retention of urine, unspecified: Secondary | ICD-10-CM | POA: Diagnosis not present

## 2014-08-04 DIAGNOSIS — E46 Unspecified protein-calorie malnutrition: Secondary | ICD-10-CM | POA: Diagnosis not present

## 2014-08-04 DIAGNOSIS — F4323 Adjustment disorder with mixed anxiety and depressed mood: Secondary | ICD-10-CM | POA: Diagnosis not present

## 2014-08-04 DIAGNOSIS — I4891 Unspecified atrial fibrillation: Secondary | ICD-10-CM | POA: Diagnosis not present

## 2014-08-04 DIAGNOSIS — F0391 Unspecified dementia with behavioral disturbance: Secondary | ICD-10-CM | POA: Diagnosis not present

## 2014-08-09 DIAGNOSIS — E785 Hyperlipidemia, unspecified: Secondary | ICD-10-CM | POA: Diagnosis not present

## 2014-08-09 DIAGNOSIS — Z79899 Other long term (current) drug therapy: Secondary | ICD-10-CM | POA: Diagnosis not present

## 2014-08-09 DIAGNOSIS — D649 Anemia, unspecified: Secondary | ICD-10-CM | POA: Diagnosis not present

## 2014-08-09 DIAGNOSIS — I10 Essential (primary) hypertension: Secondary | ICD-10-CM | POA: Diagnosis not present

## 2014-08-15 DIAGNOSIS — H7290 Unspecified perforation of tympanic membrane, unspecified ear: Secondary | ICD-10-CM | POA: Diagnosis not present

## 2014-08-15 DIAGNOSIS — H6502 Acute serous otitis media, left ear: Secondary | ICD-10-CM | POA: Diagnosis not present

## 2014-08-15 DIAGNOSIS — H905 Unspecified sensorineural hearing loss: Secondary | ICD-10-CM | POA: Diagnosis not present

## 2014-09-14 DIAGNOSIS — Z9181 History of falling: Secondary | ICD-10-CM | POA: Diagnosis not present

## 2014-09-14 DIAGNOSIS — R609 Edema, unspecified: Secondary | ICD-10-CM | POA: Diagnosis not present

## 2014-09-14 DIAGNOSIS — B029 Zoster without complications: Secondary | ICD-10-CM | POA: Diagnosis not present

## 2014-09-14 DIAGNOSIS — F0391 Unspecified dementia with behavioral disturbance: Secondary | ICD-10-CM | POA: Diagnosis not present

## 2014-09-18 DIAGNOSIS — F0391 Unspecified dementia with behavioral disturbance: Secondary | ICD-10-CM | POA: Diagnosis not present

## 2014-09-18 DIAGNOSIS — T148 Other injury of unspecified body region: Secondary | ICD-10-CM | POA: Diagnosis not present

## 2014-09-18 DIAGNOSIS — L089 Local infection of the skin and subcutaneous tissue, unspecified: Secondary | ICD-10-CM | POA: Diagnosis not present

## 2014-10-06 DIAGNOSIS — E46 Unspecified protein-calorie malnutrition: Secondary | ICD-10-CM | POA: Diagnosis not present

## 2014-10-06 DIAGNOSIS — I4891 Unspecified atrial fibrillation: Secondary | ICD-10-CM | POA: Diagnosis not present

## 2014-10-06 DIAGNOSIS — F0390 Unspecified dementia without behavioral disturbance: Secondary | ICD-10-CM | POA: Diagnosis not present

## 2014-10-30 DIAGNOSIS — N390 Urinary tract infection, site not specified: Secondary | ICD-10-CM | POA: Diagnosis not present

## 2014-10-30 DIAGNOSIS — R319 Hematuria, unspecified: Secondary | ICD-10-CM | POA: Diagnosis not present

## 2014-11-01 DIAGNOSIS — N390 Urinary tract infection, site not specified: Secondary | ICD-10-CM | POA: Diagnosis not present

## 2014-11-01 DIAGNOSIS — R32 Unspecified urinary incontinence: Secondary | ICD-10-CM | POA: Diagnosis not present

## 2014-11-01 DIAGNOSIS — F0390 Unspecified dementia without behavioral disturbance: Secondary | ICD-10-CM | POA: Diagnosis not present

## 2014-11-01 DIAGNOSIS — R509 Fever, unspecified: Secondary | ICD-10-CM | POA: Diagnosis not present

## 2014-11-02 DIAGNOSIS — S60212A Contusion of left wrist, initial encounter: Secondary | ICD-10-CM | POA: Diagnosis not present

## 2014-11-02 DIAGNOSIS — M25532 Pain in left wrist: Secondary | ICD-10-CM | POA: Diagnosis not present

## 2014-11-02 DIAGNOSIS — M79642 Pain in left hand: Secondary | ICD-10-CM | POA: Diagnosis not present

## 2014-11-02 DIAGNOSIS — R600 Localized edema: Secondary | ICD-10-CM | POA: Diagnosis not present

## 2014-11-02 DIAGNOSIS — F0391 Unspecified dementia with behavioral disturbance: Secondary | ICD-10-CM | POA: Diagnosis not present

## 2014-11-02 DIAGNOSIS — F0281 Dementia in other diseases classified elsewhere with behavioral disturbance: Secondary | ICD-10-CM | POA: Diagnosis not present

## 2014-11-03 DIAGNOSIS — M7989 Other specified soft tissue disorders: Secondary | ICD-10-CM | POA: Diagnosis not present

## 2014-11-06 DIAGNOSIS — E559 Vitamin D deficiency, unspecified: Secondary | ICD-10-CM | POA: Diagnosis not present

## 2014-11-06 DIAGNOSIS — E785 Hyperlipidemia, unspecified: Secondary | ICD-10-CM | POA: Diagnosis not present

## 2014-11-06 DIAGNOSIS — E039 Hypothyroidism, unspecified: Secondary | ICD-10-CM | POA: Diagnosis not present

## 2014-11-06 DIAGNOSIS — D649 Anemia, unspecified: Secondary | ICD-10-CM | POA: Diagnosis not present

## 2014-11-06 DIAGNOSIS — Z79899 Other long term (current) drug therapy: Secondary | ICD-10-CM | POA: Diagnosis not present

## 2014-11-08 DIAGNOSIS — F09 Unspecified mental disorder due to known physiological condition: Secondary | ICD-10-CM | POA: Diagnosis not present

## 2014-11-08 DIAGNOSIS — R32 Unspecified urinary incontinence: Secondary | ICD-10-CM | POA: Diagnosis not present

## 2014-11-08 DIAGNOSIS — R799 Abnormal finding of blood chemistry, unspecified: Secondary | ICD-10-CM | POA: Diagnosis not present

## 2014-11-08 DIAGNOSIS — N390 Urinary tract infection, site not specified: Secondary | ICD-10-CM | POA: Diagnosis not present

## 2014-11-15 DIAGNOSIS — M79675 Pain in left toe(s): Secondary | ICD-10-CM | POA: Diagnosis not present

## 2014-11-15 DIAGNOSIS — B351 Tinea unguium: Secondary | ICD-10-CM | POA: Diagnosis not present

## 2014-11-15 DIAGNOSIS — M79674 Pain in right toe(s): Secondary | ICD-10-CM | POA: Diagnosis not present

## 2014-11-15 DIAGNOSIS — M797 Fibromyalgia: Secondary | ICD-10-CM | POA: Diagnosis not present

## 2014-11-15 DIAGNOSIS — L853 Xerosis cutis: Secondary | ICD-10-CM | POA: Diagnosis not present

## 2014-11-15 DIAGNOSIS — M2010 Hallux valgus (acquired), unspecified foot: Secondary | ICD-10-CM | POA: Diagnosis not present

## 2014-12-01 DIAGNOSIS — I4891 Unspecified atrial fibrillation: Secondary | ICD-10-CM | POA: Diagnosis not present

## 2014-12-01 DIAGNOSIS — E46 Unspecified protein-calorie malnutrition: Secondary | ICD-10-CM | POA: Diagnosis not present

## 2014-12-01 DIAGNOSIS — F0391 Unspecified dementia with behavioral disturbance: Secondary | ICD-10-CM | POA: Diagnosis not present

## 2015-01-26 DIAGNOSIS — E46 Unspecified protein-calorie malnutrition: Secondary | ICD-10-CM | POA: Diagnosis not present

## 2015-01-26 DIAGNOSIS — I481 Persistent atrial fibrillation: Secondary | ICD-10-CM | POA: Diagnosis not present

## 2015-01-26 DIAGNOSIS — F0391 Unspecified dementia with behavioral disturbance: Secondary | ICD-10-CM | POA: Diagnosis not present

## 2015-04-30 DIAGNOSIS — Z79899 Other long term (current) drug therapy: Secondary | ICD-10-CM | POA: Diagnosis not present

## 2015-04-30 DIAGNOSIS — E785 Hyperlipidemia, unspecified: Secondary | ICD-10-CM | POA: Diagnosis not present

## 2015-04-30 DIAGNOSIS — I10 Essential (primary) hypertension: Secondary | ICD-10-CM | POA: Diagnosis not present

## 2015-04-30 DIAGNOSIS — D649 Anemia, unspecified: Secondary | ICD-10-CM | POA: Diagnosis not present

## 2015-05-02 DIAGNOSIS — Z5181 Encounter for therapeutic drug level monitoring: Secondary | ICD-10-CM | POA: Diagnosis not present

## 2015-05-11 DIAGNOSIS — I10 Essential (primary) hypertension: Secondary | ICD-10-CM | POA: Diagnosis not present

## 2015-05-11 DIAGNOSIS — E46 Unspecified protein-calorie malnutrition: Secondary | ICD-10-CM | POA: Diagnosis not present

## 2015-05-11 DIAGNOSIS — E559 Vitamin D deficiency, unspecified: Secondary | ICD-10-CM | POA: Diagnosis not present

## 2015-05-11 DIAGNOSIS — I482 Chronic atrial fibrillation: Secondary | ICD-10-CM | POA: Diagnosis not present

## 2015-05-11 DIAGNOSIS — F0391 Unspecified dementia with behavioral disturbance: Secondary | ICD-10-CM | POA: Diagnosis not present

## 2015-05-11 DIAGNOSIS — D649 Anemia, unspecified: Secondary | ICD-10-CM | POA: Diagnosis not present

## 2015-05-11 DIAGNOSIS — H9193 Unspecified hearing loss, bilateral: Secondary | ICD-10-CM | POA: Diagnosis not present

## 2015-05-11 DIAGNOSIS — E785 Hyperlipidemia, unspecified: Secondary | ICD-10-CM | POA: Diagnosis not present

## 2015-05-11 DIAGNOSIS — R569 Unspecified convulsions: Secondary | ICD-10-CM | POA: Diagnosis not present

## 2015-05-11 DIAGNOSIS — Z79899 Other long term (current) drug therapy: Secondary | ICD-10-CM | POA: Diagnosis not present

## 2015-05-11 DIAGNOSIS — E039 Hypothyroidism, unspecified: Secondary | ICD-10-CM | POA: Diagnosis not present

## 2015-05-23 IMAGING — CR DG CHEST 2V
2 series · 2 of 2 positions shown · non-contrast
Comparison: 09/23/2012.

CLINICAL DATA: Bilateral lower leg swelling and weakness. Shortness
of breath.

EXAM:
CHEST  2 VIEW

[w chest lat]
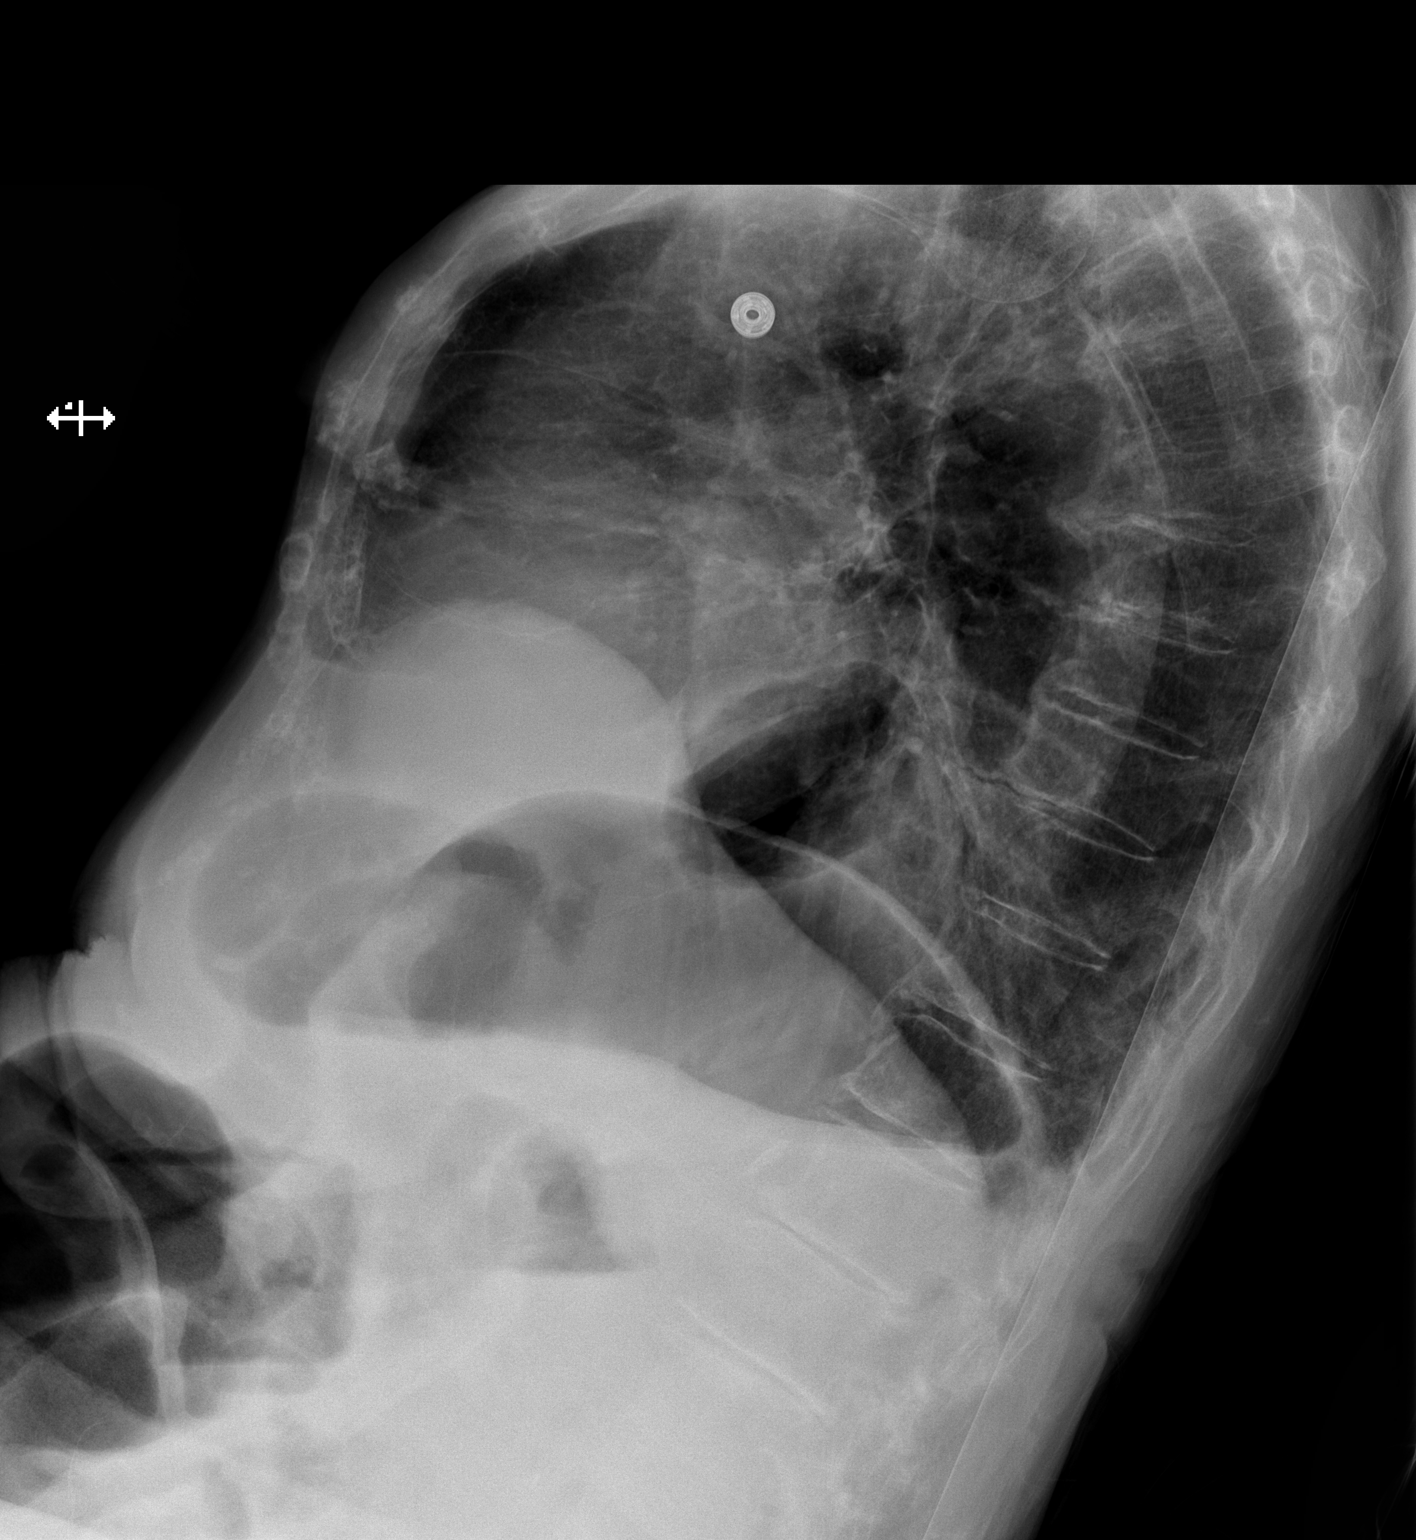

[x chest ap]
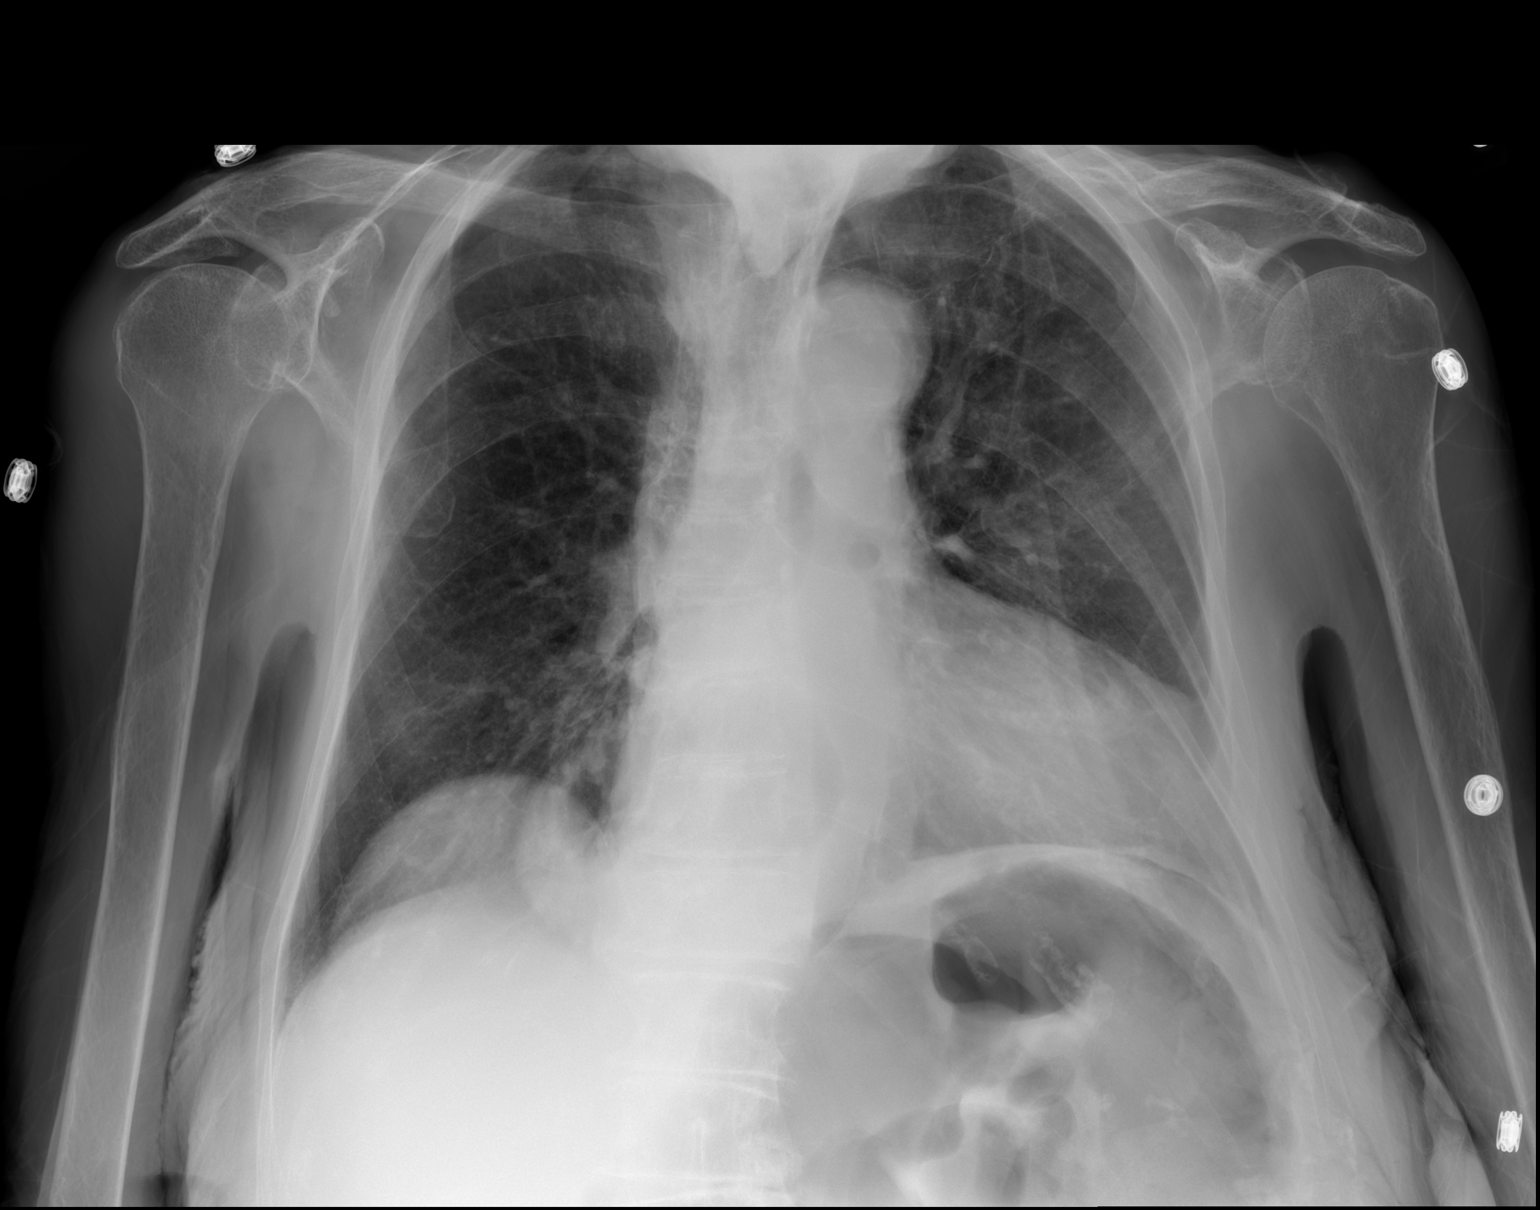

[2 of 2 positions shown; findings below may reference images not displayed]

FINDINGS: Hyperexpansion suggests emphysema. No edema or focal airspace
consolidation. No pleural effusion. The cardio pericardial
silhouette is enlarged. Hiatal hernia again noted. Bones are
diffusely demineralized.
IMPRESSION: Emphysema without acute cardiopulmonary findings.

Hiatal hernia.

## 2015-06-11 DIAGNOSIS — Z961 Presence of intraocular lens: Secondary | ICD-10-CM | POA: Diagnosis not present

## 2015-06-11 DIAGNOSIS — Z7951 Long term (current) use of inhaled steroids: Secondary | ICD-10-CM | POA: Diagnosis not present

## 2015-06-11 DIAGNOSIS — H353131 Nonexudative age-related macular degeneration, bilateral, early dry stage: Secondary | ICD-10-CM | POA: Diagnosis not present

## 2015-06-25 DIAGNOSIS — R05 Cough: Secondary | ICD-10-CM | POA: Diagnosis not present

## 2015-06-29 DIAGNOSIS — F0391 Unspecified dementia with behavioral disturbance: Secondary | ICD-10-CM | POA: Diagnosis not present

## 2015-06-29 DIAGNOSIS — E039 Hypothyroidism, unspecified: Secondary | ICD-10-CM | POA: Diagnosis not present

## 2015-06-29 DIAGNOSIS — E46 Unspecified protein-calorie malnutrition: Secondary | ICD-10-CM | POA: Diagnosis not present

## 2015-06-29 DIAGNOSIS — I4891 Unspecified atrial fibrillation: Secondary | ICD-10-CM | POA: Diagnosis not present

## 2015-08-02 DIAGNOSIS — E039 Hypothyroidism, unspecified: Secondary | ICD-10-CM | POA: Diagnosis not present

## 2015-08-02 DIAGNOSIS — D649 Anemia, unspecified: Secondary | ICD-10-CM | POA: Diagnosis not present

## 2015-08-02 DIAGNOSIS — I10 Essential (primary) hypertension: Secondary | ICD-10-CM | POA: Diagnosis not present

## 2015-08-02 DIAGNOSIS — E785 Hyperlipidemia, unspecified: Secondary | ICD-10-CM | POA: Diagnosis not present

## 2015-08-03 DIAGNOSIS — M79671 Pain in right foot: Secondary | ICD-10-CM | POA: Diagnosis not present

## 2015-08-03 DIAGNOSIS — M79672 Pain in left foot: Secondary | ICD-10-CM | POA: Diagnosis not present

## 2015-08-03 DIAGNOSIS — B351 Tinea unguium: Secondary | ICD-10-CM | POA: Diagnosis not present

## 2015-08-24 DIAGNOSIS — H902 Conductive hearing loss, unspecified: Secondary | ICD-10-CM | POA: Diagnosis not present

## 2015-08-24 DIAGNOSIS — E039 Hypothyroidism, unspecified: Secondary | ICD-10-CM | POA: Diagnosis not present

## 2015-08-24 DIAGNOSIS — F0391 Unspecified dementia with behavioral disturbance: Secondary | ICD-10-CM | POA: Diagnosis not present

## 2015-08-24 DIAGNOSIS — E46 Unspecified protein-calorie malnutrition: Secondary | ICD-10-CM | POA: Diagnosis not present

## 2015-08-24 DIAGNOSIS — I482 Chronic atrial fibrillation: Secondary | ICD-10-CM | POA: Diagnosis not present

## 2015-10-12 DIAGNOSIS — Z933 Colostomy status: Secondary | ICD-10-CM | POA: Diagnosis not present

## 2015-10-12 DIAGNOSIS — I10 Essential (primary) hypertension: Secondary | ICD-10-CM | POA: Diagnosis not present

## 2015-10-12 DIAGNOSIS — F0391 Unspecified dementia with behavioral disturbance: Secondary | ICD-10-CM | POA: Diagnosis not present

## 2015-10-12 DIAGNOSIS — I482 Chronic atrial fibrillation: Secondary | ICD-10-CM | POA: Diagnosis not present

## 2015-10-12 DIAGNOSIS — E039 Hypothyroidism, unspecified: Secondary | ICD-10-CM | POA: Diagnosis not present

## 2015-11-02 DIAGNOSIS — B351 Tinea unguium: Secondary | ICD-10-CM | POA: Diagnosis not present

## 2015-11-02 DIAGNOSIS — M79671 Pain in right foot: Secondary | ICD-10-CM | POA: Diagnosis not present

## 2015-11-02 DIAGNOSIS — M79672 Pain in left foot: Secondary | ICD-10-CM | POA: Diagnosis not present

## 2015-11-21 DIAGNOSIS — R569 Unspecified convulsions: Secondary | ICD-10-CM | POA: Diagnosis not present

## 2015-11-21 DIAGNOSIS — Z79899 Other long term (current) drug therapy: Secondary | ICD-10-CM | POA: Diagnosis not present

## 2015-11-21 DIAGNOSIS — I10 Essential (primary) hypertension: Secondary | ICD-10-CM | POA: Diagnosis not present

## 2015-11-21 DIAGNOSIS — E784 Other hyperlipidemia: Secondary | ICD-10-CM | POA: Diagnosis not present

## 2015-11-21 DIAGNOSIS — D649 Anemia, unspecified: Secondary | ICD-10-CM | POA: Diagnosis not present

## 2015-11-22 DIAGNOSIS — F0634 Mood disorder due to known physiological condition with mixed features: Secondary | ICD-10-CM | POA: Diagnosis not present

## 2015-11-22 DIAGNOSIS — G301 Alzheimer's disease with late onset: Secondary | ICD-10-CM | POA: Diagnosis not present

## 2015-11-29 DIAGNOSIS — R569 Unspecified convulsions: Secondary | ICD-10-CM | POA: Diagnosis not present

## 2015-11-29 DIAGNOSIS — I10 Essential (primary) hypertension: Secondary | ICD-10-CM | POA: Diagnosis not present

## 2015-12-07 DIAGNOSIS — E039 Hypothyroidism, unspecified: Secondary | ICD-10-CM | POA: Diagnosis not present

## 2015-12-07 DIAGNOSIS — Z933 Colostomy status: Secondary | ICD-10-CM | POA: Diagnosis not present

## 2015-12-07 DIAGNOSIS — I4891 Unspecified atrial fibrillation: Secondary | ICD-10-CM | POA: Diagnosis not present

## 2015-12-07 DIAGNOSIS — H9190 Unspecified hearing loss, unspecified ear: Secondary | ICD-10-CM | POA: Diagnosis not present

## 2015-12-07 DIAGNOSIS — F0391 Unspecified dementia with behavioral disturbance: Secondary | ICD-10-CM | POA: Diagnosis not present

## 2015-12-12 DIAGNOSIS — F0634 Mood disorder due to known physiological condition with mixed features: Secondary | ICD-10-CM | POA: Diagnosis not present

## 2015-12-12 DIAGNOSIS — G301 Alzheimer's disease with late onset: Secondary | ICD-10-CM | POA: Diagnosis not present

## 2016-01-08 DIAGNOSIS — I10 Essential (primary) hypertension: Secondary | ICD-10-CM | POA: Diagnosis not present

## 2016-01-08 DIAGNOSIS — R600 Localized edema: Secondary | ICD-10-CM | POA: Diagnosis not present

## 2016-01-08 DIAGNOSIS — M25531 Pain in right wrist: Secondary | ICD-10-CM | POA: Diagnosis not present

## 2016-01-15 DIAGNOSIS — G301 Alzheimer's disease with late onset: Secondary | ICD-10-CM | POA: Diagnosis not present

## 2016-01-15 DIAGNOSIS — F0634 Mood disorder due to known physiological condition with mixed features: Secondary | ICD-10-CM | POA: Diagnosis not present

## 2016-01-25 DIAGNOSIS — E46 Unspecified protein-calorie malnutrition: Secondary | ICD-10-CM | POA: Diagnosis not present

## 2016-01-25 DIAGNOSIS — F0391 Unspecified dementia with behavioral disturbance: Secondary | ICD-10-CM | POA: Diagnosis not present

## 2016-01-25 DIAGNOSIS — Z933 Colostomy status: Secondary | ICD-10-CM | POA: Diagnosis not present

## 2016-01-25 DIAGNOSIS — I4891 Unspecified atrial fibrillation: Secondary | ICD-10-CM | POA: Diagnosis not present

## 2016-02-09 DIAGNOSIS — D649 Anemia, unspecified: Secondary | ICD-10-CM | POA: Diagnosis not present

## 2016-02-11 DIAGNOSIS — M79671 Pain in right foot: Secondary | ICD-10-CM | POA: Diagnosis not present

## 2016-02-11 DIAGNOSIS — M79672 Pain in left foot: Secondary | ICD-10-CM | POA: Diagnosis not present

## 2016-02-11 DIAGNOSIS — B351 Tinea unguium: Secondary | ICD-10-CM | POA: Diagnosis not present

## 2016-02-25 DIAGNOSIS — G301 Alzheimer's disease with late onset: Secondary | ICD-10-CM | POA: Diagnosis not present

## 2016-02-25 DIAGNOSIS — F0634 Mood disorder due to known physiological condition with mixed features: Secondary | ICD-10-CM | POA: Diagnosis not present

## 2016-03-11 DIAGNOSIS — G301 Alzheimer's disease with late onset: Secondary | ICD-10-CM | POA: Diagnosis not present

## 2016-03-11 DIAGNOSIS — F0634 Mood disorder due to known physiological condition with mixed features: Secondary | ICD-10-CM | POA: Diagnosis not present

## 2016-03-14 DIAGNOSIS — M797 Fibromyalgia: Secondary | ICD-10-CM | POA: Diagnosis not present

## 2016-03-14 DIAGNOSIS — I4891 Unspecified atrial fibrillation: Secondary | ICD-10-CM | POA: Diagnosis not present

## 2016-03-14 DIAGNOSIS — Z933 Colostomy status: Secondary | ICD-10-CM | POA: Diagnosis not present

## 2016-03-14 DIAGNOSIS — F0391 Unspecified dementia with behavioral disturbance: Secondary | ICD-10-CM | POA: Diagnosis not present

## 2016-03-24 DIAGNOSIS — F0634 Mood disorder due to known physiological condition with mixed features: Secondary | ICD-10-CM | POA: Diagnosis not present

## 2016-03-24 DIAGNOSIS — G301 Alzheimer's disease with late onset: Secondary | ICD-10-CM | POA: Diagnosis not present

## 2016-04-24 DIAGNOSIS — G301 Alzheimer's disease with late onset: Secondary | ICD-10-CM | POA: Diagnosis not present

## 2016-04-24 DIAGNOSIS — F0634 Mood disorder due to known physiological condition with mixed features: Secondary | ICD-10-CM | POA: Diagnosis not present

## 2016-04-27 DIAGNOSIS — G301 Alzheimer's disease with late onset: Secondary | ICD-10-CM | POA: Diagnosis not present

## 2016-04-27 DIAGNOSIS — F0634 Mood disorder due to known physiological condition with mixed features: Secondary | ICD-10-CM | POA: Diagnosis not present

## 2016-04-29 DIAGNOSIS — E559 Vitamin D deficiency, unspecified: Secondary | ICD-10-CM | POA: Diagnosis not present

## 2016-04-29 DIAGNOSIS — E039 Hypothyroidism, unspecified: Secondary | ICD-10-CM | POA: Diagnosis not present

## 2016-04-29 DIAGNOSIS — I10 Essential (primary) hypertension: Secondary | ICD-10-CM | POA: Diagnosis not present

## 2016-04-29 DIAGNOSIS — Z86010 Personal history of colonic polyps: Secondary | ICD-10-CM | POA: Diagnosis not present

## 2016-04-29 DIAGNOSIS — D649 Anemia, unspecified: Secondary | ICD-10-CM | POA: Diagnosis not present

## 2016-04-29 DIAGNOSIS — Z79899 Other long term (current) drug therapy: Secondary | ICD-10-CM | POA: Diagnosis not present

## 2016-04-29 DIAGNOSIS — E785 Hyperlipidemia, unspecified: Secondary | ICD-10-CM | POA: Diagnosis not present

## 2016-05-02 DIAGNOSIS — F0391 Unspecified dementia with behavioral disturbance: Secondary | ICD-10-CM | POA: Diagnosis not present

## 2016-05-02 DIAGNOSIS — I4891 Unspecified atrial fibrillation: Secondary | ICD-10-CM | POA: Diagnosis not present

## 2016-05-02 DIAGNOSIS — E46 Unspecified protein-calorie malnutrition: Secondary | ICD-10-CM | POA: Diagnosis not present

## 2016-06-23 DIAGNOSIS — G301 Alzheimer's disease with late onset: Secondary | ICD-10-CM | POA: Diagnosis not present

## 2016-06-23 DIAGNOSIS — F0634 Mood disorder due to known physiological condition with mixed features: Secondary | ICD-10-CM | POA: Diagnosis not present

## 2016-06-25 DIAGNOSIS — Z7951 Long term (current) use of inhaled steroids: Secondary | ICD-10-CM | POA: Diagnosis not present

## 2016-06-25 DIAGNOSIS — Z961 Presence of intraocular lens: Secondary | ICD-10-CM | POA: Diagnosis not present

## 2016-06-25 DIAGNOSIS — H353131 Nonexudative age-related macular degeneration, bilateral, early dry stage: Secondary | ICD-10-CM | POA: Diagnosis not present

## 2016-06-26 DIAGNOSIS — F419 Anxiety disorder, unspecified: Secondary | ICD-10-CM | POA: Diagnosis not present

## 2016-06-26 DIAGNOSIS — F0281 Dementia in other diseases classified elsewhere with behavioral disturbance: Secondary | ICD-10-CM | POA: Diagnosis not present

## 2016-06-26 DIAGNOSIS — F330 Major depressive disorder, recurrent, mild: Secondary | ICD-10-CM | POA: Diagnosis not present

## 2016-06-26 DIAGNOSIS — G301 Alzheimer's disease with late onset: Secondary | ICD-10-CM | POA: Diagnosis not present

## 2016-06-27 DIAGNOSIS — F0391 Unspecified dementia with behavioral disturbance: Secondary | ICD-10-CM | POA: Diagnosis not present

## 2016-06-27 DIAGNOSIS — F329 Major depressive disorder, single episode, unspecified: Secondary | ICD-10-CM | POA: Diagnosis not present

## 2016-06-27 DIAGNOSIS — E46 Unspecified protein-calorie malnutrition: Secondary | ICD-10-CM | POA: Diagnosis not present

## 2016-06-27 DIAGNOSIS — I4891 Unspecified atrial fibrillation: Secondary | ICD-10-CM | POA: Diagnosis not present

## 2016-07-08 DIAGNOSIS — F419 Anxiety disorder, unspecified: Secondary | ICD-10-CM | POA: Diagnosis not present

## 2016-07-08 DIAGNOSIS — G301 Alzheimer's disease with late onset: Secondary | ICD-10-CM | POA: Diagnosis not present

## 2016-07-08 DIAGNOSIS — F0281 Dementia in other diseases classified elsewhere with behavioral disturbance: Secondary | ICD-10-CM | POA: Diagnosis not present

## 2016-07-08 DIAGNOSIS — F330 Major depressive disorder, recurrent, mild: Secondary | ICD-10-CM | POA: Diagnosis not present

## 2016-07-10 DIAGNOSIS — I4891 Unspecified atrial fibrillation: Secondary | ICD-10-CM | POA: Diagnosis not present

## 2016-07-10 DIAGNOSIS — E039 Hypothyroidism, unspecified: Secondary | ICD-10-CM | POA: Diagnosis not present

## 2016-07-10 DIAGNOSIS — F0391 Unspecified dementia with behavioral disturbance: Secondary | ICD-10-CM | POA: Diagnosis not present

## 2016-07-10 DIAGNOSIS — Z9181 History of falling: Secondary | ICD-10-CM | POA: Diagnosis not present

## 2016-07-23 DIAGNOSIS — F0634 Mood disorder due to known physiological condition with mixed features: Secondary | ICD-10-CM | POA: Diagnosis not present

## 2016-07-23 DIAGNOSIS — G301 Alzheimer's disease with late onset: Secondary | ICD-10-CM | POA: Diagnosis not present

## 2016-08-12 DIAGNOSIS — F330 Major depressive disorder, recurrent, mild: Secondary | ICD-10-CM | POA: Diagnosis not present

## 2016-08-12 DIAGNOSIS — F0281 Dementia in other diseases classified elsewhere with behavioral disturbance: Secondary | ICD-10-CM | POA: Diagnosis not present

## 2016-08-12 DIAGNOSIS — F419 Anxiety disorder, unspecified: Secondary | ICD-10-CM | POA: Diagnosis not present

## 2016-08-12 DIAGNOSIS — G301 Alzheimer's disease with late onset: Secondary | ICD-10-CM | POA: Diagnosis not present

## 2016-08-22 DIAGNOSIS — H9190 Unspecified hearing loss, unspecified ear: Secondary | ICD-10-CM | POA: Diagnosis not present

## 2016-08-22 DIAGNOSIS — F0391 Unspecified dementia with behavioral disturbance: Secondary | ICD-10-CM | POA: Diagnosis not present

## 2016-08-22 DIAGNOSIS — E46 Unspecified protein-calorie malnutrition: Secondary | ICD-10-CM | POA: Diagnosis not present

## 2016-08-22 DIAGNOSIS — E039 Hypothyroidism, unspecified: Secondary | ICD-10-CM | POA: Diagnosis not present

## 2016-08-22 DIAGNOSIS — I4891 Unspecified atrial fibrillation: Secondary | ICD-10-CM | POA: Diagnosis not present

## 2016-08-24 DIAGNOSIS — F0634 Mood disorder due to known physiological condition with mixed features: Secondary | ICD-10-CM | POA: Diagnosis not present

## 2016-08-24 DIAGNOSIS — G301 Alzheimer's disease with late onset: Secondary | ICD-10-CM | POA: Diagnosis not present

## 2016-09-01 DIAGNOSIS — F419 Anxiety disorder, unspecified: Secondary | ICD-10-CM | POA: Diagnosis not present

## 2016-09-01 DIAGNOSIS — G301 Alzheimer's disease with late onset: Secondary | ICD-10-CM | POA: Diagnosis not present

## 2016-09-01 DIAGNOSIS — F0281 Dementia in other diseases classified elsewhere with behavioral disturbance: Secondary | ICD-10-CM | POA: Diagnosis not present

## 2016-09-01 DIAGNOSIS — F330 Major depressive disorder, recurrent, mild: Secondary | ICD-10-CM | POA: Diagnosis not present

## 2016-09-05 DIAGNOSIS — E039 Hypothyroidism, unspecified: Secondary | ICD-10-CM | POA: Diagnosis not present

## 2016-09-05 DIAGNOSIS — F0391 Unspecified dementia with behavioral disturbance: Secondary | ICD-10-CM | POA: Diagnosis not present

## 2016-09-05 DIAGNOSIS — I4891 Unspecified atrial fibrillation: Secondary | ICD-10-CM | POA: Diagnosis not present

## 2016-09-05 DIAGNOSIS — M25432 Effusion, left wrist: Secondary | ICD-10-CM | POA: Diagnosis not present

## 2016-09-09 DIAGNOSIS — F419 Anxiety disorder, unspecified: Secondary | ICD-10-CM | POA: Diagnosis not present

## 2016-09-09 DIAGNOSIS — F330 Major depressive disorder, recurrent, mild: Secondary | ICD-10-CM | POA: Diagnosis not present

## 2016-09-09 DIAGNOSIS — G301 Alzheimer's disease with late onset: Secondary | ICD-10-CM | POA: Diagnosis not present

## 2016-09-09 DIAGNOSIS — F0281 Dementia in other diseases classified elsewhere with behavioral disturbance: Secondary | ICD-10-CM | POA: Diagnosis not present

## 2016-09-10 DIAGNOSIS — F330 Major depressive disorder, recurrent, mild: Secondary | ICD-10-CM | POA: Diagnosis not present

## 2016-09-10 DIAGNOSIS — F419 Anxiety disorder, unspecified: Secondary | ICD-10-CM | POA: Diagnosis not present

## 2016-09-10 DIAGNOSIS — G301 Alzheimer's disease with late onset: Secondary | ICD-10-CM | POA: Diagnosis not present

## 2016-09-12 DIAGNOSIS — F0391 Unspecified dementia with behavioral disturbance: Secondary | ICD-10-CM | POA: Diagnosis not present

## 2016-09-12 DIAGNOSIS — E039 Hypothyroidism, unspecified: Secondary | ICD-10-CM | POA: Diagnosis not present

## 2016-09-12 DIAGNOSIS — M797 Fibromyalgia: Secondary | ICD-10-CM | POA: Diagnosis not present

## 2016-09-12 DIAGNOSIS — I4891 Unspecified atrial fibrillation: Secondary | ICD-10-CM | POA: Diagnosis not present

## 2016-09-12 DIAGNOSIS — H9190 Unspecified hearing loss, unspecified ear: Secondary | ICD-10-CM | POA: Diagnosis not present

## 2016-09-12 DIAGNOSIS — Z933 Colostomy status: Secondary | ICD-10-CM | POA: Diagnosis not present

## 2016-09-22 DIAGNOSIS — F0634 Mood disorder due to known physiological condition with mixed features: Secondary | ICD-10-CM | POA: Diagnosis not present

## 2016-09-22 DIAGNOSIS — G301 Alzheimer's disease with late onset: Secondary | ICD-10-CM | POA: Diagnosis not present

## 2016-09-23 DIAGNOSIS — G301 Alzheimer's disease with late onset: Secondary | ICD-10-CM | POA: Diagnosis not present

## 2016-09-23 DIAGNOSIS — F419 Anxiety disorder, unspecified: Secondary | ICD-10-CM | POA: Diagnosis not present

## 2016-09-23 DIAGNOSIS — F0281 Dementia in other diseases classified elsewhere with behavioral disturbance: Secondary | ICD-10-CM | POA: Diagnosis not present

## 2016-09-23 DIAGNOSIS — F330 Major depressive disorder, recurrent, mild: Secondary | ICD-10-CM | POA: Diagnosis not present

## 2016-10-07 DIAGNOSIS — G301 Alzheimer's disease with late onset: Secondary | ICD-10-CM | POA: Diagnosis not present

## 2016-10-07 DIAGNOSIS — F419 Anxiety disorder, unspecified: Secondary | ICD-10-CM | POA: Diagnosis not present

## 2016-10-07 DIAGNOSIS — F0281 Dementia in other diseases classified elsewhere with behavioral disturbance: Secondary | ICD-10-CM | POA: Diagnosis not present

## 2016-10-07 DIAGNOSIS — F330 Major depressive disorder, recurrent, mild: Secondary | ICD-10-CM | POA: Diagnosis not present

## 2016-10-10 DIAGNOSIS — E46 Unspecified protein-calorie malnutrition: Secondary | ICD-10-CM | POA: Diagnosis not present

## 2016-10-10 DIAGNOSIS — I4891 Unspecified atrial fibrillation: Secondary | ICD-10-CM | POA: Diagnosis not present

## 2016-10-10 DIAGNOSIS — E039 Hypothyroidism, unspecified: Secondary | ICD-10-CM | POA: Diagnosis not present

## 2016-10-10 DIAGNOSIS — F0391 Unspecified dementia with behavioral disturbance: Secondary | ICD-10-CM | POA: Diagnosis not present

## 2016-10-10 DIAGNOSIS — Z933 Colostomy status: Secondary | ICD-10-CM | POA: Diagnosis not present

## 2016-10-21 DIAGNOSIS — F0281 Dementia in other diseases classified elsewhere with behavioral disturbance: Secondary | ICD-10-CM | POA: Diagnosis not present

## 2016-10-21 DIAGNOSIS — G301 Alzheimer's disease with late onset: Secondary | ICD-10-CM | POA: Diagnosis not present

## 2016-10-21 DIAGNOSIS — F419 Anxiety disorder, unspecified: Secondary | ICD-10-CM | POA: Diagnosis not present

## 2016-10-21 DIAGNOSIS — F330 Major depressive disorder, recurrent, mild: Secondary | ICD-10-CM | POA: Diagnosis not present

## 2016-10-23 DIAGNOSIS — Z5181 Encounter for therapeutic drug level monitoring: Secondary | ICD-10-CM | POA: Diagnosis not present

## 2016-10-23 DIAGNOSIS — R569 Unspecified convulsions: Secondary | ICD-10-CM | POA: Diagnosis not present

## 2016-10-24 DIAGNOSIS — F0634 Mood disorder due to known physiological condition with mixed features: Secondary | ICD-10-CM | POA: Diagnosis not present

## 2016-10-24 DIAGNOSIS — G301 Alzheimer's disease with late onset: Secondary | ICD-10-CM | POA: Diagnosis not present

## 2016-10-28 DIAGNOSIS — I10 Essential (primary) hypertension: Secondary | ICD-10-CM | POA: Diagnosis not present

## 2016-10-28 DIAGNOSIS — E785 Hyperlipidemia, unspecified: Secondary | ICD-10-CM | POA: Diagnosis not present

## 2016-10-28 DIAGNOSIS — Z79899 Other long term (current) drug therapy: Secondary | ICD-10-CM | POA: Diagnosis not present

## 2016-10-28 DIAGNOSIS — Z5181 Encounter for therapeutic drug level monitoring: Secondary | ICD-10-CM | POA: Diagnosis not present

## 2016-10-28 DIAGNOSIS — R569 Unspecified convulsions: Secondary | ICD-10-CM | POA: Diagnosis not present

## 2016-10-28 DIAGNOSIS — D649 Anemia, unspecified: Secondary | ICD-10-CM | POA: Diagnosis not present

## 2016-10-30 DIAGNOSIS — F419 Anxiety disorder, unspecified: Secondary | ICD-10-CM | POA: Diagnosis not present

## 2016-10-30 DIAGNOSIS — F330 Major depressive disorder, recurrent, mild: Secondary | ICD-10-CM | POA: Diagnosis not present

## 2016-10-30 DIAGNOSIS — F0281 Dementia in other diseases classified elsewhere with behavioral disturbance: Secondary | ICD-10-CM | POA: Diagnosis not present

## 2016-10-30 DIAGNOSIS — G301 Alzheimer's disease with late onset: Secondary | ICD-10-CM | POA: Diagnosis not present

## 2016-11-14 DIAGNOSIS — F0391 Unspecified dementia with behavioral disturbance: Secondary | ICD-10-CM | POA: Diagnosis not present

## 2016-11-14 DIAGNOSIS — E039 Hypothyroidism, unspecified: Secondary | ICD-10-CM | POA: Diagnosis not present

## 2016-11-14 DIAGNOSIS — E46 Unspecified protein-calorie malnutrition: Secondary | ICD-10-CM | POA: Diagnosis not present

## 2016-11-14 DIAGNOSIS — I4891 Unspecified atrial fibrillation: Secondary | ICD-10-CM | POA: Diagnosis not present

## 2016-11-14 DIAGNOSIS — Z933 Colostomy status: Secondary | ICD-10-CM | POA: Diagnosis not present

## 2016-11-21 DIAGNOSIS — G301 Alzheimer's disease with late onset: Secondary | ICD-10-CM | POA: Diagnosis not present

## 2016-11-21 DIAGNOSIS — F0634 Mood disorder due to known physiological condition with mixed features: Secondary | ICD-10-CM | POA: Diagnosis not present

## 2016-12-12 DIAGNOSIS — E46 Unspecified protein-calorie malnutrition: Secondary | ICD-10-CM | POA: Diagnosis not present

## 2016-12-12 DIAGNOSIS — Z933 Colostomy status: Secondary | ICD-10-CM | POA: Diagnosis not present

## 2016-12-12 DIAGNOSIS — M797 Fibromyalgia: Secondary | ICD-10-CM | POA: Diagnosis not present

## 2016-12-12 DIAGNOSIS — E039 Hypothyroidism, unspecified: Secondary | ICD-10-CM | POA: Diagnosis not present

## 2016-12-12 DIAGNOSIS — F0391 Unspecified dementia with behavioral disturbance: Secondary | ICD-10-CM | POA: Diagnosis not present

## 2016-12-12 DIAGNOSIS — I4891 Unspecified atrial fibrillation: Secondary | ICD-10-CM | POA: Diagnosis not present

## 2016-12-24 DIAGNOSIS — G301 Alzheimer's disease with late onset: Secondary | ICD-10-CM | POA: Diagnosis not present

## 2016-12-24 DIAGNOSIS — F0634 Mood disorder due to known physiological condition with mixed features: Secondary | ICD-10-CM | POA: Diagnosis not present

## 2016-12-30 DIAGNOSIS — G301 Alzheimer's disease with late onset: Secondary | ICD-10-CM | POA: Diagnosis not present

## 2016-12-30 DIAGNOSIS — F330 Major depressive disorder, recurrent, mild: Secondary | ICD-10-CM | POA: Diagnosis not present

## 2016-12-30 DIAGNOSIS — F0281 Dementia in other diseases classified elsewhere with behavioral disturbance: Secondary | ICD-10-CM | POA: Diagnosis not present

## 2016-12-30 DIAGNOSIS — F419 Anxiety disorder, unspecified: Secondary | ICD-10-CM | POA: Diagnosis not present

## 2017-01-16 DIAGNOSIS — Z933 Colostomy status: Secondary | ICD-10-CM | POA: Diagnosis not present

## 2017-01-16 DIAGNOSIS — F29 Unspecified psychosis not due to a substance or known physiological condition: Secondary | ICD-10-CM | POA: Diagnosis not present

## 2017-01-16 DIAGNOSIS — E46 Unspecified protein-calorie malnutrition: Secondary | ICD-10-CM | POA: Diagnosis not present

## 2017-01-16 DIAGNOSIS — I4891 Unspecified atrial fibrillation: Secondary | ICD-10-CM | POA: Diagnosis not present

## 2017-01-16 DIAGNOSIS — E039 Hypothyroidism, unspecified: Secondary | ICD-10-CM | POA: Diagnosis not present

## 2017-01-16 DIAGNOSIS — F0391 Unspecified dementia with behavioral disturbance: Secondary | ICD-10-CM | POA: Diagnosis not present

## 2017-01-22 DIAGNOSIS — F419 Anxiety disorder, unspecified: Secondary | ICD-10-CM | POA: Diagnosis not present

## 2017-01-22 DIAGNOSIS — F0281 Dementia in other diseases classified elsewhere with behavioral disturbance: Secondary | ICD-10-CM | POA: Diagnosis not present

## 2017-01-22 DIAGNOSIS — F330 Major depressive disorder, recurrent, mild: Secondary | ICD-10-CM | POA: Diagnosis not present

## 2017-01-22 DIAGNOSIS — G301 Alzheimer's disease with late onset: Secondary | ICD-10-CM | POA: Diagnosis not present

## 2017-01-23 DIAGNOSIS — G301 Alzheimer's disease with late onset: Secondary | ICD-10-CM | POA: Diagnosis not present

## 2017-01-23 DIAGNOSIS — F0634 Mood disorder due to known physiological condition with mixed features: Secondary | ICD-10-CM | POA: Diagnosis not present

## 2017-02-11 DIAGNOSIS — F0281 Dementia in other diseases classified elsewhere with behavioral disturbance: Secondary | ICD-10-CM | POA: Diagnosis not present

## 2017-02-11 DIAGNOSIS — G301 Alzheimer's disease with late onset: Secondary | ICD-10-CM | POA: Diagnosis not present

## 2017-02-11 DIAGNOSIS — F419 Anxiety disorder, unspecified: Secondary | ICD-10-CM | POA: Diagnosis not present

## 2017-02-11 DIAGNOSIS — F330 Major depressive disorder, recurrent, mild: Secondary | ICD-10-CM | POA: Diagnosis not present

## 2017-02-20 DIAGNOSIS — E039 Hypothyroidism, unspecified: Secondary | ICD-10-CM | POA: Diagnosis not present

## 2017-02-20 DIAGNOSIS — Z933 Colostomy status: Secondary | ICD-10-CM | POA: Diagnosis not present

## 2017-02-20 DIAGNOSIS — I4891 Unspecified atrial fibrillation: Secondary | ICD-10-CM | POA: Diagnosis not present

## 2017-02-20 DIAGNOSIS — E46 Unspecified protein-calorie malnutrition: Secondary | ICD-10-CM | POA: Diagnosis not present

## 2017-02-20 DIAGNOSIS — F0391 Unspecified dementia with behavioral disturbance: Secondary | ICD-10-CM | POA: Diagnosis not present

## 2017-02-23 DIAGNOSIS — G301 Alzheimer's disease with late onset: Secondary | ICD-10-CM | POA: Diagnosis not present

## 2017-02-23 DIAGNOSIS — F0634 Mood disorder due to known physiological condition with mixed features: Secondary | ICD-10-CM | POA: Diagnosis not present

## 2017-03-11 DIAGNOSIS — G301 Alzheimer's disease with late onset: Secondary | ICD-10-CM | POA: Diagnosis not present

## 2017-03-11 DIAGNOSIS — F419 Anxiety disorder, unspecified: Secondary | ICD-10-CM | POA: Diagnosis not present

## 2017-03-11 DIAGNOSIS — F330 Major depressive disorder, recurrent, mild: Secondary | ICD-10-CM | POA: Diagnosis not present

## 2017-03-11 DIAGNOSIS — F0281 Dementia in other diseases classified elsewhere with behavioral disturbance: Secondary | ICD-10-CM | POA: Diagnosis not present

## 2017-03-13 DIAGNOSIS — I4891 Unspecified atrial fibrillation: Secondary | ICD-10-CM | POA: Diagnosis not present

## 2017-03-13 DIAGNOSIS — Z933 Colostomy status: Secondary | ICD-10-CM | POA: Diagnosis not present

## 2017-03-13 DIAGNOSIS — M797 Fibromyalgia: Secondary | ICD-10-CM | POA: Diagnosis not present

## 2017-03-13 DIAGNOSIS — F0391 Unspecified dementia with behavioral disturbance: Secondary | ICD-10-CM | POA: Diagnosis not present

## 2017-03-27 DIAGNOSIS — G301 Alzheimer's disease with late onset: Secondary | ICD-10-CM | POA: Diagnosis not present

## 2017-03-27 DIAGNOSIS — F0634 Mood disorder due to known physiological condition with mixed features: Secondary | ICD-10-CM | POA: Diagnosis not present

## 2017-04-22 DIAGNOSIS — G301 Alzheimer's disease with late onset: Secondary | ICD-10-CM | POA: Diagnosis not present

## 2017-04-22 DIAGNOSIS — F330 Major depressive disorder, recurrent, mild: Secondary | ICD-10-CM | POA: Diagnosis not present

## 2017-04-22 DIAGNOSIS — F0281 Dementia in other diseases classified elsewhere with behavioral disturbance: Secondary | ICD-10-CM | POA: Diagnosis not present

## 2017-04-22 DIAGNOSIS — F419 Anxiety disorder, unspecified: Secondary | ICD-10-CM | POA: Diagnosis not present

## 2017-04-24 DIAGNOSIS — G301 Alzheimer's disease with late onset: Secondary | ICD-10-CM | POA: Diagnosis not present

## 2017-04-24 DIAGNOSIS — F0634 Mood disorder due to known physiological condition with mixed features: Secondary | ICD-10-CM | POA: Diagnosis not present

## 2017-04-24 DIAGNOSIS — I4891 Unspecified atrial fibrillation: Secondary | ICD-10-CM | POA: Diagnosis not present

## 2017-04-24 DIAGNOSIS — F0391 Unspecified dementia with behavioral disturbance: Secondary | ICD-10-CM | POA: Diagnosis not present

## 2017-04-24 DIAGNOSIS — E039 Hypothyroidism, unspecified: Secondary | ICD-10-CM | POA: Diagnosis not present

## 2017-04-24 DIAGNOSIS — Z5181 Encounter for therapeutic drug level monitoring: Secondary | ICD-10-CM | POA: Diagnosis not present

## 2017-04-24 DIAGNOSIS — R569 Unspecified convulsions: Secondary | ICD-10-CM | POA: Diagnosis not present

## 2017-04-24 DIAGNOSIS — E46 Unspecified protein-calorie malnutrition: Secondary | ICD-10-CM | POA: Diagnosis not present

## 2017-05-07 DIAGNOSIS — E039 Hypothyroidism, unspecified: Secondary | ICD-10-CM | POA: Diagnosis not present

## 2017-05-07 DIAGNOSIS — E559 Vitamin D deficiency, unspecified: Secondary | ICD-10-CM | POA: Diagnosis not present

## 2017-05-07 DIAGNOSIS — E785 Hyperlipidemia, unspecified: Secondary | ICD-10-CM | POA: Diagnosis not present

## 2017-05-07 DIAGNOSIS — I10 Essential (primary) hypertension: Secondary | ICD-10-CM | POA: Diagnosis not present

## 2017-05-07 DIAGNOSIS — R569 Unspecified convulsions: Secondary | ICD-10-CM | POA: Diagnosis not present

## 2017-05-07 DIAGNOSIS — D649 Anemia, unspecified: Secondary | ICD-10-CM | POA: Diagnosis not present

## 2017-05-07 DIAGNOSIS — Z79899 Other long term (current) drug therapy: Secondary | ICD-10-CM | POA: Diagnosis not present

## 2017-05-15 DIAGNOSIS — I10 Essential (primary) hypertension: Secondary | ICD-10-CM | POA: Diagnosis not present

## 2017-05-15 DIAGNOSIS — I4891 Unspecified atrial fibrillation: Secondary | ICD-10-CM | POA: Diagnosis not present

## 2017-05-15 DIAGNOSIS — F0391 Unspecified dementia with behavioral disturbance: Secondary | ICD-10-CM | POA: Diagnosis not present

## 2017-05-15 DIAGNOSIS — F29 Unspecified psychosis not due to a substance or known physiological condition: Secondary | ICD-10-CM | POA: Diagnosis not present

## 2017-05-15 DIAGNOSIS — E039 Hypothyroidism, unspecified: Secondary | ICD-10-CM | POA: Diagnosis not present

## 2017-05-15 DIAGNOSIS — F322 Major depressive disorder, single episode, severe without psychotic features: Secondary | ICD-10-CM | POA: Diagnosis not present

## 2017-05-15 DIAGNOSIS — Z933 Colostomy status: Secondary | ICD-10-CM | POA: Diagnosis not present

## 2017-06-03 DIAGNOSIS — G301 Alzheimer's disease with late onset: Secondary | ICD-10-CM | POA: Diagnosis not present

## 2017-06-03 DIAGNOSIS — F330 Major depressive disorder, recurrent, mild: Secondary | ICD-10-CM | POA: Diagnosis not present

## 2017-06-03 DIAGNOSIS — F0281 Dementia in other diseases classified elsewhere with behavioral disturbance: Secondary | ICD-10-CM | POA: Diagnosis not present

## 2017-06-03 DIAGNOSIS — F419 Anxiety disorder, unspecified: Secondary | ICD-10-CM | POA: Diagnosis not present

## 2017-06-08 DIAGNOSIS — E039 Hypothyroidism, unspecified: Secondary | ICD-10-CM | POA: Diagnosis not present

## 2017-06-08 DIAGNOSIS — F0391 Unspecified dementia with behavioral disturbance: Secondary | ICD-10-CM | POA: Diagnosis not present

## 2017-06-08 DIAGNOSIS — I10 Essential (primary) hypertension: Secondary | ICD-10-CM | POA: Diagnosis not present

## 2017-06-08 DIAGNOSIS — L210 Seborrhea capitis: Secondary | ICD-10-CM | POA: Diagnosis not present

## 2017-06-12 DIAGNOSIS — I4891 Unspecified atrial fibrillation: Secondary | ICD-10-CM | POA: Diagnosis not present

## 2017-06-12 DIAGNOSIS — E039 Hypothyroidism, unspecified: Secondary | ICD-10-CM | POA: Diagnosis not present

## 2017-06-12 DIAGNOSIS — R21 Rash and other nonspecific skin eruption: Secondary | ICD-10-CM | POA: Diagnosis not present

## 2017-06-12 DIAGNOSIS — E46 Unspecified protein-calorie malnutrition: Secondary | ICD-10-CM | POA: Diagnosis not present

## 2017-06-12 DIAGNOSIS — F29 Unspecified psychosis not due to a substance or known physiological condition: Secondary | ICD-10-CM | POA: Diagnosis not present

## 2017-06-12 DIAGNOSIS — F0391 Unspecified dementia with behavioral disturbance: Secondary | ICD-10-CM | POA: Diagnosis not present

## 2017-06-12 DIAGNOSIS — Z933 Colostomy status: Secondary | ICD-10-CM | POA: Diagnosis not present

## 2017-06-25 DIAGNOSIS — F0634 Mood disorder due to known physiological condition with mixed features: Secondary | ICD-10-CM | POA: Diagnosis not present

## 2017-06-25 DIAGNOSIS — G301 Alzheimer's disease with late onset: Secondary | ICD-10-CM | POA: Diagnosis not present

## 2017-07-08 DIAGNOSIS — G301 Alzheimer's disease with late onset: Secondary | ICD-10-CM | POA: Diagnosis not present

## 2017-07-08 DIAGNOSIS — F419 Anxiety disorder, unspecified: Secondary | ICD-10-CM | POA: Diagnosis not present

## 2017-07-08 DIAGNOSIS — F0281 Dementia in other diseases classified elsewhere with behavioral disturbance: Secondary | ICD-10-CM | POA: Diagnosis not present

## 2017-07-08 DIAGNOSIS — F330 Major depressive disorder, recurrent, mild: Secondary | ICD-10-CM | POA: Diagnosis not present

## 2017-07-10 DIAGNOSIS — F0391 Unspecified dementia with behavioral disturbance: Secondary | ICD-10-CM | POA: Diagnosis not present

## 2017-07-10 DIAGNOSIS — E039 Hypothyroidism, unspecified: Secondary | ICD-10-CM | POA: Diagnosis not present

## 2017-07-10 DIAGNOSIS — Z933 Colostomy status: Secondary | ICD-10-CM | POA: Diagnosis not present

## 2017-07-10 DIAGNOSIS — I4891 Unspecified atrial fibrillation: Secondary | ICD-10-CM | POA: Diagnosis not present

## 2017-07-10 DIAGNOSIS — E46 Unspecified protein-calorie malnutrition: Secondary | ICD-10-CM | POA: Diagnosis not present

## 2017-07-14 ENCOUNTER — Inpatient Hospital Stay (HOSPITAL_COMMUNITY)
Admission: EM | Admit: 2017-07-14 | Discharge: 2017-07-20 | DRG: 564 | Disposition: A | Discharge status: Skilled Nursing Facility | Payer: Medicare Other | Attending: General Surgery | Admitting: General Surgery

## 2017-07-14 ENCOUNTER — Emergency Department (HOSPITAL_COMMUNITY): Payer: Medicare Other

## 2017-07-14 ENCOUNTER — Observation Stay (HOSPITAL_COMMUNITY): Payer: Medicare Other

## 2017-07-14 ENCOUNTER — Encounter (HOSPITAL_COMMUNITY): Payer: Self-pay

## 2017-07-14 ENCOUNTER — Other Ambulatory Visit: Payer: Self-pay

## 2017-07-14 DIAGNOSIS — J189 Pneumonia, unspecified organism: Secondary | ICD-10-CM | POA: Diagnosis not present

## 2017-07-14 DIAGNOSIS — R55 Syncope and collapse: Secondary | ICD-10-CM | POA: Diagnosis not present

## 2017-07-14 DIAGNOSIS — E039 Hypothyroidism, unspecified: Secondary | ICD-10-CM | POA: Diagnosis present

## 2017-07-14 DIAGNOSIS — Z79890 Hormone replacement therapy (postmenopausal): Secondary | ICD-10-CM

## 2017-07-14 DIAGNOSIS — Z66 Do not resuscitate: Secondary | ICD-10-CM | POA: Diagnosis present

## 2017-07-14 DIAGNOSIS — R0602 Shortness of breath: Secondary | ICD-10-CM

## 2017-07-14 DIAGNOSIS — S2222XA Fracture of body of sternum, initial encounter for closed fracture: Secondary | ICD-10-CM | POA: Diagnosis not present

## 2017-07-14 DIAGNOSIS — S81012A Laceration without foreign body, left knee, initial encounter: Secondary | ICD-10-CM | POA: Diagnosis present

## 2017-07-14 DIAGNOSIS — F0390 Unspecified dementia without behavioral disturbance: Secondary | ICD-10-CM | POA: Diagnosis not present

## 2017-07-14 DIAGNOSIS — J9601 Acute respiratory failure with hypoxia: Secondary | ICD-10-CM | POA: Diagnosis not present

## 2017-07-14 DIAGNOSIS — Y92128 Other place in nursing home as the place of occurrence of the external cause: Secondary | ICD-10-CM

## 2017-07-14 DIAGNOSIS — Z87891 Personal history of nicotine dependence: Secondary | ICD-10-CM

## 2017-07-14 DIAGNOSIS — Z90710 Acquired absence of both cervix and uterus: Secondary | ICD-10-CM

## 2017-07-14 DIAGNOSIS — T148XXA Other injury of unspecified body region, initial encounter: Secondary | ICD-10-CM | POA: Diagnosis not present

## 2017-07-14 DIAGNOSIS — S2220XA Unspecified fracture of sternum, initial encounter for closed fracture: Secondary | ICD-10-CM | POA: Diagnosis present

## 2017-07-14 DIAGNOSIS — M797 Fibromyalgia: Secondary | ICD-10-CM | POA: Diagnosis present

## 2017-07-14 DIAGNOSIS — E871 Hypo-osmolality and hyponatremia: Secondary | ICD-10-CM | POA: Diagnosis not present

## 2017-07-14 DIAGNOSIS — Z7401 Bed confinement status: Secondary | ICD-10-CM

## 2017-07-14 DIAGNOSIS — S81812A Laceration without foreign body, left lower leg, initial encounter: Secondary | ICD-10-CM | POA: Diagnosis not present

## 2017-07-14 DIAGNOSIS — Z781 Physical restraint status: Secondary | ICD-10-CM

## 2017-07-14 DIAGNOSIS — E876 Hypokalemia: Secondary | ICD-10-CM | POA: Diagnosis present

## 2017-07-14 DIAGNOSIS — L8990 Pressure ulcer of unspecified site, unspecified stage: Secondary | ICD-10-CM

## 2017-07-14 DIAGNOSIS — J90 Pleural effusion, not elsewhere classified: Secondary | ICD-10-CM | POA: Diagnosis not present

## 2017-07-14 DIAGNOSIS — W1830XA Fall on same level, unspecified, initial encounter: Secondary | ICD-10-CM | POA: Diagnosis present

## 2017-07-14 DIAGNOSIS — Z515 Encounter for palliative care: Secondary | ICD-10-CM

## 2017-07-14 DIAGNOSIS — I4891 Unspecified atrial fibrillation: Secondary | ICD-10-CM | POA: Diagnosis present

## 2017-07-14 DIAGNOSIS — I10 Essential (primary) hypertension: Secondary | ICD-10-CM | POA: Diagnosis present

## 2017-07-14 DIAGNOSIS — R079 Chest pain, unspecified: Secondary | ICD-10-CM | POA: Diagnosis not present

## 2017-07-14 DIAGNOSIS — Z933 Colostomy status: Secondary | ICD-10-CM

## 2017-07-14 DIAGNOSIS — Z7189 Other specified counseling: Secondary | ICD-10-CM

## 2017-07-14 DIAGNOSIS — W19XXXA Unspecified fall, initial encounter: Secondary | ICD-10-CM

## 2017-07-14 DIAGNOSIS — H9190 Unspecified hearing loss, unspecified ear: Secondary | ICD-10-CM | POA: Diagnosis present

## 2017-07-14 LAB — CBC
HCT: 34.9 % — ABNORMAL LOW (ref 36.0–46.0)
HEMOGLOBIN: 11.3 g/dL — AB (ref 12.0–15.0)
MCH: 28.5 pg (ref 26.0–34.0)
MCHC: 32.4 g/dL (ref 30.0–36.0)
MCV: 88.1 fL (ref 78.0–100.0)
PLATELETS: 388 10*3/uL (ref 150–400)
RBC: 3.96 MIL/uL (ref 3.87–5.11)
RDW: 15.1 % (ref 11.5–15.5)
WBC: 13.3 10*3/uL — ABNORMAL HIGH (ref 4.0–10.5)

## 2017-07-14 LAB — CBC WITH DIFFERENTIAL/PLATELET
BASOS PCT: 0 %
Basophils Absolute: 0 10*3/uL (ref 0.0–0.1)
EOS PCT: 0 %
Eosinophils Absolute: 0 10*3/uL (ref 0.0–0.7)
HCT: 38.8 % (ref 36.0–46.0)
HEMOGLOBIN: 12.6 g/dL (ref 12.0–15.0)
LYMPHS PCT: 8 %
Lymphs Abs: 1.2 10*3/uL (ref 0.7–4.0)
MCH: 28.8 pg (ref 26.0–34.0)
MCHC: 32.5 g/dL (ref 30.0–36.0)
MCV: 88.8 fL (ref 78.0–100.0)
MONO ABS: 0.6 10*3/uL (ref 0.1–1.0)
MONOS PCT: 4 %
NEUTROS PCT: 88 %
Neutro Abs: 13.1 10*3/uL — ABNORMAL HIGH (ref 1.7–7.7)
PLATELETS: 438 10*3/uL — AB (ref 150–400)
RBC: 4.37 MIL/uL (ref 3.87–5.11)
RDW: 15.1 % (ref 11.5–15.5)
WBC: 14.9 10*3/uL — AB (ref 4.0–10.5)

## 2017-07-14 LAB — CREATININE, SERUM
CREATININE: 0.41 mg/dL — AB (ref 0.44–1.00)
GFR calc Af Amer: >60 mL/min (ref >60)

## 2017-07-14 LAB — PROTIME-INR
INR: 1.01
Prothrombin Time: 13.2 seconds (ref 11.4–15.2)

## 2017-07-14 LAB — BASIC METABOLIC PANEL
ANION GAP: 10 (ref 5–15)
BUN: 16 mg/dL (ref 6–20)
CHLORIDE: 95 mmol/L — AB (ref 101–111)
CO2: 26 mmol/L (ref 22–32)
CREATININE: 0.35 mg/dL — AB (ref 0.44–1.00)
Calcium: 8.8 mg/dL — ABNORMAL LOW (ref 8.9–10.3)
GFR calc non Af Amer: >60 mL/min (ref >60)
Glucose, Bld: 112 mg/dL — ABNORMAL HIGH (ref 65–99)
POTASSIUM: 3.7 mmol/L (ref 3.5–5.1)
SODIUM: 131 mmol/L — AB (ref 135–145)

## 2017-07-14 LAB — TYPE AND SCREEN
ABO/RH(D): AB POS
Antibody Screen: NEGATIVE

## 2017-07-14 LAB — MRSA PCR SCREENING: MRSA by PCR: NEGATIVE

## 2017-07-14 MED ORDER — DEXTROSE 5 % IV SOLN: 500.0000 mg | Freq: Three times a day (TID) | INTRAVENOUS | Status: DC | PRN

## 2017-07-14 MED ORDER — IOPAMIDOL (ISOVUE-300) INJECTION 61%
INTRAVENOUS | Status: AC
  Administered 2017-07-14: 75 mL via INTRAVENOUS
  Filled 2017-07-14: qty 75

## 2017-07-14 MED ORDER — TRAMADOL HCL 50 MG PO TABS
50.0000 mg | ORAL_TABLET | Freq: Once | ORAL | Status: AC
  Administered 2017-07-14: 50 mg via ORAL
  Filled 2017-07-14: qty 1

## 2017-07-14 MED ORDER — POTASSIUM CHLORIDE CRYS ER 20 MEQ PO TBCR
60.0000 meq | EXTENDED_RELEASE_TABLET | Freq: Two times a day (BID) | ORAL | Status: DC
  Filled 2017-07-14 (×2): qty 3

## 2017-07-14 MED ORDER — FLUTICASONE PROPIONATE 50 MCG/ACT NA SUSP
2.0000 | Freq: Every day | NASAL | Status: DC
  Administered 2017-07-14 – 2017-07-18 (×4): 2 via NASAL
  Filled 2017-07-14 (×2): qty 16

## 2017-07-14 MED ORDER — HEPARIN SODIUM (PORCINE) 5000 UNIT/ML IJ SOLN
5000.0000 [IU] | Freq: Three times a day (TID) | INTRAMUSCULAR | Status: DC
  Administered 2017-07-14 – 2017-07-17 (×8): 5000 [IU] via SUBCUTANEOUS
  Filled 2017-07-14 (×10): qty 1

## 2017-07-14 MED ORDER — SPIRONOLACTONE 12.5 MG HALF TABLET
12.5000 mg | ORAL_TABLET | Freq: Every day | ORAL | Status: DC
  Administered 2017-07-14 – 2017-07-16 (×2): 12.5 mg via ORAL
  Filled 2017-07-14 (×4): qty 1

## 2017-07-14 MED ORDER — ONDANSETRON HCL 4 MG/2ML IJ SOLN: 4.0000 mg | Freq: Four times a day (QID) | INTRAMUSCULAR | Status: DC | PRN

## 2017-07-14 MED ORDER — ACETAMINOPHEN 325 MG PO TABS
650.0000 mg | ORAL_TABLET | ORAL | Status: DC | PRN
  Administered 2017-07-16 – 2017-07-19 (×2): 650 mg via ORAL
  Filled 2017-07-14 (×3): qty 2

## 2017-07-14 MED ORDER — PANTOPRAZOLE SODIUM 40 MG IV SOLR
40.0000 mg | Freq: Every day | INTRAVENOUS | Status: DC
  Administered 2017-07-14 – 2017-07-18 (×5): 40 mg via INTRAVENOUS
  Filled 2017-07-14 (×7): qty 40

## 2017-07-14 MED ORDER — BUSPIRONE HCL 15 MG PO TABS
15.0000 mg | ORAL_TABLET | Freq: Two times a day (BID) | ORAL | Status: DC
  Administered 2017-07-16 – 2017-07-20 (×6): 15 mg via ORAL
  Filled 2017-07-14 (×9): qty 1

## 2017-07-14 MED ORDER — SERTRALINE HCL 50 MG PO TABS
25.0000 mg | ORAL_TABLET | Freq: Every day | ORAL | Status: DC
  Administered 2017-07-14 – 2017-07-20 (×4): 25 mg via ORAL
  Filled 2017-07-14 (×7): qty 1

## 2017-07-14 MED ORDER — METOPROLOL TARTRATE 5 MG/5ML IV SOLN
5.0000 mg | Freq: Four times a day (QID) | INTRAVENOUS | Status: DC | PRN
  Filled 2017-07-14: qty 5

## 2017-07-14 MED ORDER — MAGNESIUM OXIDE 400 (241.3 MG) MG PO TABS
400.0000 mg | ORAL_TABLET | Freq: Every day | ORAL | Status: DC
  Administered 2017-07-14 – 2017-07-16 (×2): 400 mg via ORAL
  Filled 2017-07-14 (×5): qty 1

## 2017-07-14 MED ORDER — HYDRALAZINE HCL 10 MG PO TABS
10.0000 mg | ORAL_TABLET | Freq: Two times a day (BID) | ORAL | Status: DC
  Filled 2017-07-14 (×2): qty 1

## 2017-07-14 MED ORDER — VALPROATE SODIUM 500 MG/5ML IV SOLN
250.0000 mg | Freq: Two times a day (BID) | INTRAVENOUS | Status: DC
  Administered 2017-07-14 – 2017-07-20 (×12): 250 mg via INTRAVENOUS
  Filled 2017-07-14 (×14): qty 2.5

## 2017-07-14 MED ORDER — ACETAMINOPHEN 500 MG PO TABS
1000.0000 mg | ORAL_TABLET | Freq: Once | ORAL | Status: AC
  Administered 2017-07-20: 1000 mg via ORAL
  Filled 2017-07-14 (×3): qty 2

## 2017-07-14 MED ORDER — CHLORHEXIDINE GLUCONATE 0.12 % MT SOLN
15.0000 mL | Freq: Two times a day (BID) | OROMUCOSAL | Status: DC
  Administered 2017-07-14 – 2017-07-19 (×7): 15 mL via OROMUCOSAL
  Filled 2017-07-14 (×7): qty 15

## 2017-07-14 MED ORDER — DOCUSATE SODIUM 100 MG PO CAPS
100.0000 mg | ORAL_CAPSULE | Freq: Two times a day (BID) | ORAL | Status: DC
  Administered 2017-07-14 – 2017-07-20 (×5): 100 mg via ORAL
  Filled 2017-07-14 (×11): qty 1

## 2017-07-14 MED ORDER — HYDRALAZINE HCL 20 MG/ML IJ SOLN
2.0000 mg | Freq: Two times a day (BID) | INTRAMUSCULAR | Status: DC
  Administered 2017-07-17: 2 mg via INTRAVENOUS
  Filled 2017-07-14 (×4): qty 1

## 2017-07-14 MED ORDER — TRAMADOL HCL 50 MG PO TABS: 50.0000 mg | ORAL_TABLET | Freq: Four times a day (QID) | ORAL | Status: DC | PRN

## 2017-07-14 MED ORDER — ORAL CARE MOUTH RINSE: 15.0000 mL | Freq: Two times a day (BID) | OROMUCOSAL | Status: DC

## 2017-07-14 MED ORDER — LEVOTHYROXINE SODIUM 112 MCG PO TABS: 112.0000 ug | ORAL_TABLET | Freq: Every day | ORAL | Status: DC

## 2017-07-14 MED ORDER — VITAMIN D 1000 UNITS PO TABS
1000.0000 [IU] | ORAL_TABLET | Freq: Every day | ORAL | Status: DC
  Administered 2017-07-14 – 2017-07-16 (×2): 1000 [IU] via ORAL
  Filled 2017-07-14 (×5): qty 1

## 2017-07-14 MED ORDER — SODIUM CHLORIDE 0.45 % IV SOLN
INTRAVENOUS | Status: DC
  Administered 2017-07-14 (×2): via INTRAVENOUS

## 2017-07-14 MED ORDER — PANTOPRAZOLE SODIUM 40 MG PO TBEC
40.0000 mg | DELAYED_RELEASE_TABLET | Freq: Every day | ORAL | Status: DC
  Administered 2017-07-19 – 2017-07-20 (×2): 40 mg via ORAL
  Filled 2017-07-14 (×2): qty 1

## 2017-07-14 MED ORDER — OXYBUTYNIN CHLORIDE 5 MG PO TABS
5.0000 mg | ORAL_TABLET | Freq: Every day | ORAL | Status: DC
  Administered 2017-07-16: 5 mg via ORAL
  Filled 2017-07-14: qty 1

## 2017-07-14 MED ORDER — METHOCARBAMOL 500 MG PO TABS
500.0000 mg | ORAL_TABLET | Freq: Three times a day (TID) | ORAL | Status: DC | PRN
Start: 2017-07-14 — End: 2017-07-14

## 2017-07-14 MED ORDER — LIDOCAINE-EPINEPHRINE (PF) 2 %-1:200000 IJ SOLN
10.0000 mL | Freq: Once | INTRAMUSCULAR | Status: AC
  Administered 2017-07-14: 10 mL
  Filled 2017-07-14: qty 20

## 2017-07-14 MED ORDER — ONDANSETRON 4 MG PO TBDP: 4.0000 mg | ORAL_TABLET | Freq: Four times a day (QID) | ORAL | Status: DC | PRN

## 2017-07-14 MED ORDER — DIVALPROEX SODIUM 125 MG PO CSDR
250.0000 mg | DELAYED_RELEASE_CAPSULE | Freq: Two times a day (BID) | ORAL | Status: DC
  Administered 2017-07-14: 250 mg via ORAL
  Filled 2017-07-14 (×3): qty 2

## 2017-07-14 MED ORDER — SIMETHICONE 80 MG PO CHEW
80.0000 mg | CHEWABLE_TABLET | Freq: Two times a day (BID) | ORAL | Status: DC
  Administered 2017-07-14 – 2017-07-17 (×4): 80 mg via ORAL
  Filled 2017-07-14 (×6): qty 1
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
## 2017-07-14 NOTE — ED Notes (Signed)
Bed: [id number] Expected date: 07/14/17 Expected time: 7:24 AM Means of arrival: Ambulance Comments: Fall, knee pain

## 2017-07-14 NOTE — ED Triage Notes (Signed)
Pt. Arrive from [facility]. Staff say pt was yelling and found pt laying next to bed. Laceration to left leg, above and below left knee. Bruise on chest. Staff can't confirm or deny the anatomy of pts. chest wall. Towel around her neck. Alert to person only, with dementia. BP-120/78, P-74, RR-16, CBG-161.

## 2017-07-14 NOTE — H&P (Signed)
[facility] Admission Note  [name] Jul 18, 1926  [id number].    Requesting MD: Arby Barrette Chief Complaint/Reason for Consult: Fall  HPI:  [name] is an 83yo female PMH dementia brought to the [facility] from [facility] where she was found down lying beside her bed. Patient was complaining of chest and left knee pain. She is alert and does not appear in any acute distress. She cannot tell me how she injured herself. History limited due to dementia. She was found to have a left knee laceration, repaired in the ED, and no underlying fracture on xray. She was also found to have a sternal body fracture with displacement, CT scan pending. She does not appear to have any head/neck trauma, and is not complaining of any pain in her back or other 3 extremities.  PMH significant for Atrial fibrillation, Hypothyroidism, HTN, Dementia Anticoagulants: none Lives in SNF  ROS: Review of Systems  Constitutional: Negative.   HENT: Positive for hearing loss.   Eyes: Negative.   Respiratory: Negative.   Cardiovascular: Positive for chest pain.  Gastrointestinal: Negative.   Genitourinary: Negative.   Musculoskeletal: Positive for joint pain.       Left knee  Skin: Negative.   Neurological: Negative.   Psychiatric/Behavioral: Positive for memory loss.   Full ROS difficulty due to severe dementia  Family History  Problem Relation Age of Onset  . Prostate cancer Unknown   . Heart attack Neg Hx   . Stroke Neg Hx     Past Medical History:  Diagnosis Date  . Arthritis   . Diverticulitis 07/01/2011  . Diverticulosis   . Diverticulosis of sigmoid colon 05/09/2014  . DJD (degenerative joint disease)   . Fibromyalgia   . Hypothyroidism   . Osteoarthritis     Past Surgical History:  Procedure Laterality Date  . ABDOMINAL ADHESION SURGERY    . ABDOMINAL HYSTERECTOMY    . COLONOSCOPY  2011   Dr Randa Evens, [facility]  . ECTOPIC PREGNANCY SURGERY    . KNEE  ARTHROPLASTY    . LAPAROTOMY N/A 05/12/2014   Procedure: OPEN EXPLORATORY LAPAROTOMY, SIGMOID COLECTOMY, LYSIS OF ADHESIONS, SEROSAL REPAIR, COLOSTOMY;  Surgeon: Karie Soda, MD;  Location: [facility];  Service: General;  Laterality: N/A;    Social History:  reports that she quit smoking about 33 years ago. Her smoking use included cigarettes. she has never used smokeless tobacco. She reports that she drinks alcohol. She reports that she does not use drugs.  Allergies:  Allergies  Allergen Reactions  . Antihistamines, Chlorpheniramine-Type Other (See Comments)    headache  . Codeine     headache     (Not in a hospital admission)  Prior to Admission medications   Medication Sig Start Date End Date Taking? Authorizing Provider  acetaminophen (TYLENOL) 325 MG tablet Take 650 mg by mouth every 4 (four) hours as needed for mild pain, moderate pain or headache.   Yes [provider]  Ascorbic Acid (C-500 PO) Take 1 tablet by mouth 2 (two) times daily.   Yes [provider]  busPIRone (BUSPAR) 15 MG tablet Take 15 mg by mouth 2 (two) times daily. 06/11/17  Yes [provider]  cholecalciferol (VITAMIN D) 1000 UNITS tablet Take 1,000 Units by mouth daily.   Yes [provider]  divalproex (DEPAKOTE SPRINKLE) 125 MG capsule Take 250 mg by mouth 2 (two) times daily. 06/22/17  Yes [provider]  fluticasone (FLONASE) 50 MCG/ACT nasal spray Place 2  sprays into both nostrils daily. 05/04/17  Yes [provider]  hydrALAZINE (APRESOLINE) 10 MG tablet Take 10 mg by mouth 2 (two) times daily.   Yes [provider]  levothyroxine (SYNTHROID, LEVOTHROID) 112 MCG tablet Take 112 mcg by mouth daily before breakfast.   Yes [provider]  magnesium oxide (MAG-OX) 400 MG tablet Take 400 mg by mouth daily.   Yes [provider]  oxybutynin (DITROPAN) 5 MG tablet Take 5 mg by mouth at bedtime. 06/24/17  Yes [provider]   potassium chloride SA (K-DUR,KLOR-CON) 20 MEQ tablet Take 3 tablets (60 mEq total) by mouth 2 (two) times daily. 05/19/14  Yes Dhungel, Nishant, MD  senna (SENOKOT) 8.6 MG tablet Take 1 tablet by mouth at bedtime.   Yes [provider]  sertraline (ZOLOFT) 25 MG tablet Take 25 mg by mouth daily. 05/31/17  Yes [provider]  simethicone (MYLICON) 80 MG chewable tablet Chew 80 mg by mouth 2 (two) times daily.   Yes [provider]  spironolactone (ALDACTONE) 25 MG tablet Take 12.5 mg by mouth daily. 07/13/17  Yes [provider]  Alum & Mag Hydroxide-Simeth (MAGIC MOUTHWASH) SOLN Take 15 mLs by mouth 4 (four) times daily as needed for mouth pain (sore throat). Patient not taking: Reported on 07/14/2017 05/19/14   Dhungel, Theda BelfastNishant, MD  feeding supplement, RESOURCE BREEZE, (RESOURCE BREEZE) LIQD Take 1 Container by mouth 2 (two) times daily between meals. 05/19/14   Dhungel, Theda BelfastNishant, MD  Neomycin-Polymyxin-Dexameth (MAXITROL OP) Apply to eye as directed. TO LEFT EYE X 1 WEEK STARTING 06/29/14 PER SNF 06/29/14   [provider]  traMADol (ULTRAM) 50 MG tablet Take 1-2 tablets (50-100 mg total) by mouth every 6 (six) hours as needed for moderate pain. Patient not taking: Reported on 07/14/2017 05/12/14   Karie SodaGross, Steven, MD    Blood pressure 126/80, pulse 95, temperature 97.7 F (36.5 C), temperature source Oral, resp. rate 19, SpO2 (!) 89 %. Physical Exam: Physical Exam  Constitutional: She is well-developed, well-nourished, and in no distress. No distress.  HENT:  Head: Normocephalic and atraumatic.  Right Ear: External ear normal.  Left Ear: External ear normal.  Nose: Nose normal.  Mouth/Throat: Oropharynx is clear and moist.  Eyes: EOM are normal. Pupils are equal, round, and reactive to light. No scleral icterus.  Neck: Normal range of motion. Neck supple. No tracheal tenderness, no spinous process tenderness and no muscular tenderness present. No  tracheal deviation and no edema present.  Cardiovascular: Normal rate, regular rhythm, normal heart sounds and intact distal pulses.  Pulmonary/Chest: Effort normal and breath sounds normal. No stridor. No respiratory distress. She has no wheezes. She exhibits bony tenderness, edema, deformity and swelling.    Tenderness, deformity (chronic) and ecchymosis noted to anterior chest  Abdominal: Soft. Bowel sounds are normal. She exhibits no distension. There is no tenderness. There is no rebound and no guarding.  Ostomy LLQ pink  Musculoskeletal:       Left knee: She exhibits laceration.       Arms:      Legs: Laceration noted to anterior left knee s/p repair with sutures intact and no active bleeding. Trace knee effusion but patient tolerates passive ROM.  Ecchymosis noted to posterior right shoulder without TTP, able to range shoulder without pain. Motor function intact BLE. 2+ DP bilaterally. No pain with palpation or range of hips.  No TTP, step offs, or ecchymosis noted to spine  Neurological: She is alert. No  cranial nerve deficit.  Skin: Skin is warm and dry. She is not diaphoretic.  Psychiatric:  Dementia  Nursing note and vitals reviewed.   Results for orders placed or performed during the hospital encounter of 07/14/17 (from the past 48 hour(s))  CBC with Differential     Status: Abnormal (Preliminary result)   Collection Time: 07/14/17 12:22 PM  Result Value Ref Range   WBC 14.9 (H) 4.0 - 10.5 K/uL   RBC 4.37 3.87 - 5.11 MIL/uL   Hemoglobin 12.6 12.0 - 15.0 g/dL   HCT 09.838.8 11.936.0 - 14.746.0 %   MCV 88.8 78.0 - 100.0 fL   MCH 28.8 26.0 - 34.0 pg   MCHC 32.5 30.0 - 36.0 g/dL   RDW 82.915.1 56.211.5 - 13.015.5 %   Platelets 438 (H) 150 - 400 K/uL   Neutrophils Relative % PENDING %   Neutro Abs PENDING 1.7 - 7.7 K/uL   Band Neutrophils PENDING %   Lymphocytes Relative PENDING %   Lymphs Abs PENDING 0.7 - 4.0 K/uL   Monocytes Relative PENDING %   Monocytes Absolute PENDING 0.1 - 1.0 K/uL     Eosinophils Relative PENDING %   Eosinophils Absolute PENDING 0.0 - 0.7 K/uL   Basophils Relative PENDING %   Basophils Absolute PENDING 0.0 - 0.1 K/uL   WBC Morphology PENDING    RBC Morphology PENDING    Smear Review PENDING    nRBC PENDING 0 /100 WBC   Metamyelocytes Relative PENDING %   Myelocytes PENDING %   Promyelocytes Absolute PENDING %   Blasts PENDING %   Dg Chest 2 View  Result Date: 07/14/2017 CLINICAL DATA:  83 year old female found down. Chest ecchymosis. Leg laceration. EXAM: CHEST  2 VIEW COMPARISON:  Chest and sternum radiographs today reported separately. Chest radiographs 05/15/2014. CT Abdomen and Pelvis 05/08/2014, and earlier FINDINGS: semi upright AP and lateral views of the chest. Chronic large gastric hiatal hernia, eccentric to the right. Stable cardiomegaly and mediastinal contours. Chronic hypo ventilation at the left lung base, but improved compared to the 2015 portable exam. No superimposed pneumothorax or pulmonary edema. Small left pleural effusion is difficult to exclude. No confluent opacity in the right lung. Osteopenia. No definite acute osseous abnormality identified in the chest. Visible bowel gas pattern is improved compared to 2015. IMPRESSION: 1. Chronic hiatal hernia and hypo ventilation at the left lung base. Small left pleural effusion is difficult to exclude, but otherwise no acute cardiopulmonary abnormality. 2. Osteopenia.  No definite acute traumatic injury identified. Electronically Signed   By: Odessa FlemingH  Hall M.D.   On: 07/14/2017 09:04   Dg Sternum  Result Date: 07/14/2017 CLINICAL DATA:  Chest contusion.  Likely fall out of bed. EXAM: STERNUM - 2+ VIEW COMPARISON:  Chest x-ray dated May 08, 2014. FINDINGS: There is a fracture of the sternal body with anterior displacement by 1 bone shaft width and 1.9 cm of overriding fragments. There is overlying soft tissue swelling. Stable cardiomediastinal silhouette.  Large hiatal hernia. IMPRESSION:  Acute fracture of the sternal body with anterior displacement and 1.9 cm of overriding fragments. Electronically Signed   By: Obie DredgeWilliam T Derry M.D.   On: 07/14/2017 09:03   Dg Pelvis 1-2 Views  Result Date: 07/14/2017 CLINICAL DATA:  83 year old female found down.  Leg laceration. EXAM: PELVIS - 1-2 VIEW COMPARISON:  CT Abdomen and Pelvis 05/08/2014, and earlier. FINDINGS: Osteopenia. Upper pelvis detail is limited by overlying bowel gas. No acute pelvic fracture is identified. Partially visible dextroconvex  lumbar scoliosis. There is bulky curvilinear dystrophic appearing calcification projecting over the medial aspect of both hip joints which is new since 2015. Grossly intact proximal femurs. IMPRESSION: Osteopenia. No acute fracture or dislocation identified about the pelvis. If occult fracture is suspected or if the patient is unable to weightbear, MRI is the preferred modality for further evaluation. Electronically Signed   By: Odessa Fleming M.D.   On: 07/14/2017 09:00   Dg Knee Complete 4 Views Left  Result Date: 07/14/2017 CLINICAL DATA:  Left leg laceration below-knee EXAM: LEFT KNEE - COMPLETE 4+ VIEW COMPARISON:  None. FINDINGS: No fracture or dislocation is seen. Moderate to severe degenerative changes, most prominent in the lateral and patellofemoral compartments. Soft tissue laceration in the suprapatellar region on the lateral view. No radiopaque foreign body is seen. IMPRESSION: Suprapatellar soft tissue laceration. No fracture, dislocation, or radiopaque foreign body is seen. Electronically Signed   By: Charline Bills M.D.   On: 07/14/2017 09:01      Assessment/Plan Atrial fibrillation not on anticoagulation Hypothyroidism - continue home meds HTN - home meds Dementia - home meds Code status DNR - per paperwork from SNF, patient unable to confirm  Unwitnessed fall Sternal fx - with displacement, CT scan pending. EKG ordered. L knee laceration - xray negative for fx, repaired in ED  12/18  ID - none VTE - SCDs FEN - IVF, NPO Foley - none  Plan - Will transfer patient to [facility] for admission to the trauma service, SDU for tele monitoring. Keep NPO until chest CT complete.   Franne Forts, [facility] 07/14/2017, 1:17 PM Pager: [phone] Consults: [phone] Mon-Fri 7:00 am-4:30 pm Sat-Sun 7:00 am-11:30 am

## 2017-07-14 NOTE — Progress Notes (Signed)
PT Cancellation Note  Patient Details Name: Adelina Mingsellie M Linn MRN: [id number] DOB: 1925/11/30   Cancelled Treatment:    Reason Eval/Treat Not Completed: Medical issues which prohibited therapy, noted displaced sternal fracture, Ct imaging ordered. Will need weight bearing precautions of Any recommended for the fracture.    Rada HayHill, Chasitty Hehl Elizabeth 07/14/2017, 3:06 PM Blanchard KelchKaren Marieta Markov PT [phone]

## 2017-07-14 NOTE — ED Notes (Signed)
Patient transported to CT 

## 2017-07-14 NOTE — ED Notes (Signed)
Pt will refuses to wear oxygen. MD made aware.

## 2017-07-14 NOTE — ED Notes (Signed)
Attempted to give report. Nurse not available.

## 2017-07-14 NOTE — ED Notes (Signed)
Report given to [id number].

## 2017-07-14 NOTE — ED Notes (Signed)
Placed patient on 2L of oxygen.

## 2017-07-15 DIAGNOSIS — Z781 Physical restraint status: Secondary | ICD-10-CM | POA: Diagnosis not present

## 2017-07-15 DIAGNOSIS — S2220XA Unspecified fracture of sternum, initial encounter for closed fracture: Secondary | ICD-10-CM | POA: Diagnosis not present

## 2017-07-15 DIAGNOSIS — W1830XA Fall on same level, unspecified, initial encounter: Secondary | ICD-10-CM | POA: Diagnosis present

## 2017-07-15 DIAGNOSIS — E876 Hypokalemia: Secondary | ICD-10-CM | POA: Diagnosis present

## 2017-07-15 DIAGNOSIS — F0280 Dementia in other diseases classified elsewhere without behavioral disturbance: Secondary | ICD-10-CM | POA: Diagnosis not present

## 2017-07-15 DIAGNOSIS — W19XXXA Unspecified fall, initial encounter: Secondary | ICD-10-CM | POA: Diagnosis not present

## 2017-07-15 DIAGNOSIS — Z933 Colostomy status: Secondary | ICD-10-CM | POA: Diagnosis not present

## 2017-07-15 DIAGNOSIS — S81012A Laceration without foreign body, left knee, initial encounter: Secondary | ICD-10-CM | POA: Diagnosis present

## 2017-07-15 DIAGNOSIS — E039 Hypothyroidism, unspecified: Secondary | ICD-10-CM | POA: Diagnosis present

## 2017-07-15 DIAGNOSIS — I4891 Unspecified atrial fibrillation: Secondary | ICD-10-CM | POA: Diagnosis present

## 2017-07-15 DIAGNOSIS — Z7189 Other specified counseling: Secondary | ICD-10-CM | POA: Diagnosis not present

## 2017-07-15 DIAGNOSIS — Z87891 Personal history of nicotine dependence: Secondary | ICD-10-CM | POA: Diagnosis not present

## 2017-07-15 DIAGNOSIS — Z515 Encounter for palliative care: Secondary | ICD-10-CM | POA: Diagnosis not present

## 2017-07-15 DIAGNOSIS — R4182 Altered mental status, unspecified: Secondary | ICD-10-CM | POA: Diagnosis not present

## 2017-07-15 DIAGNOSIS — R079 Chest pain, unspecified: Secondary | ICD-10-CM | POA: Diagnosis not present

## 2017-07-15 DIAGNOSIS — J80 Acute respiratory distress syndrome: Secondary | ICD-10-CM | POA: Diagnosis not present

## 2017-07-15 DIAGNOSIS — J189 Pneumonia, unspecified organism: Secondary | ICD-10-CM | POA: Diagnosis not present

## 2017-07-15 DIAGNOSIS — S81812A Laceration without foreign body, left lower leg, initial encounter: Secondary | ICD-10-CM | POA: Diagnosis not present

## 2017-07-15 DIAGNOSIS — Y92128 Other place in nursing home as the place of occurrence of the external cause: Secondary | ICD-10-CM | POA: Diagnosis not present

## 2017-07-15 DIAGNOSIS — G3183 Dementia with Lewy bodies: Secondary | ICD-10-CM | POA: Diagnosis not present

## 2017-07-15 DIAGNOSIS — F0391 Unspecified dementia with behavioral disturbance: Secondary | ICD-10-CM | POA: Diagnosis not present

## 2017-07-15 DIAGNOSIS — R0602 Shortness of breath: Secondary | ICD-10-CM | POA: Diagnosis not present

## 2017-07-15 DIAGNOSIS — Z79890 Hormone replacement therapy (postmenopausal): Secondary | ICD-10-CM | POA: Diagnosis not present

## 2017-07-15 DIAGNOSIS — Z90710 Acquired absence of both cervix and uterus: Secondary | ICD-10-CM | POA: Diagnosis not present

## 2017-07-15 DIAGNOSIS — E871 Hypo-osmolality and hyponatremia: Secondary | ICD-10-CM | POA: Diagnosis present

## 2017-07-15 DIAGNOSIS — F0390 Unspecified dementia without behavioral disturbance: Secondary | ICD-10-CM | POA: Diagnosis present

## 2017-07-15 DIAGNOSIS — H9190 Unspecified hearing loss, unspecified ear: Secondary | ICD-10-CM | POA: Diagnosis present

## 2017-07-15 DIAGNOSIS — Z66 Do not resuscitate: Secondary | ICD-10-CM | POA: Diagnosis present

## 2017-07-15 DIAGNOSIS — S2222XA Fracture of body of sternum, initial encounter for closed fracture: Secondary | ICD-10-CM | POA: Diagnosis not present

## 2017-07-15 DIAGNOSIS — M797 Fibromyalgia: Secondary | ICD-10-CM | POA: Diagnosis present

## 2017-07-15 DIAGNOSIS — Z7401 Bed confinement status: Secondary | ICD-10-CM | POA: Diagnosis not present

## 2017-07-15 DIAGNOSIS — L8990 Pressure ulcer of unspecified site, unspecified stage: Secondary | ICD-10-CM

## 2017-07-15 DIAGNOSIS — J9601 Acute respiratory failure with hypoxia: Secondary | ICD-10-CM | POA: Diagnosis not present

## 2017-07-15 DIAGNOSIS — I10 Essential (primary) hypertension: Secondary | ICD-10-CM | POA: Diagnosis present

## 2017-07-15 LAB — URINALYSIS, ROUTINE W REFLEX MICROSCOPIC
Bilirubin Urine: NEGATIVE
GLUCOSE, UA: NEGATIVE mg/dL
KETONES UR: 20 mg/dL — AB
Nitrite: POSITIVE — AB
PH: 6 (ref 5.0–8.0)
PROTEIN: NEGATIVE mg/dL
Specific Gravity, Urine: 1.01 (ref 1.005–1.030)

## 2017-07-15 LAB — CBC
HCT: 39.1 % (ref 36.0–46.0)
HEMOGLOBIN: 12.5 g/dL (ref 12.0–15.0)
MCH: 28.6 pg (ref 26.0–34.0)
MCHC: 32 g/dL (ref 30.0–36.0)
MCV: 89.5 fL (ref 78.0–100.0)
PLATELETS: 375 10*3/uL (ref 150–400)
RBC: 4.37 MIL/uL (ref 3.87–5.11)
RDW: 15.3 % (ref 11.5–15.5)
WBC: 16.8 10*3/uL — AB (ref 4.0–10.5)

## 2017-07-15 LAB — BASIC METABOLIC PANEL
ANION GAP: 9 (ref 5–15)
BUN: 10 mg/dL (ref 6–20)
CHLORIDE: 91 mmol/L — AB (ref 101–111)
CO2: 23 mmol/L (ref 22–32)
Calcium: 8.2 mg/dL — ABNORMAL LOW (ref 8.9–10.3)
Creatinine, Ser: 0.43 mg/dL — ABNORMAL LOW (ref 0.44–1.00)
GFR calc Af Amer: >60 mL/min (ref >60)
Glucose, Bld: 97 mg/dL (ref 65–99)
POTASSIUM: 3.5 mmol/L (ref 3.5–5.1)
SODIUM: 123 mmol/L — AB (ref 135–145)

## 2017-07-15 LAB — TYPE AND SCREEN
ABO/RH(D): AB POS
ANTIBODY SCREEN: NEGATIVE

## 2017-07-15 LAB — ABO/RH: ABO/RH(D): AB POS

## 2017-07-15 MED ORDER — FUROSEMIDE 10 MG/ML IJ SOLN
20.0000 mg | Freq: Once | INTRAMUSCULAR | Status: AC
  Administered 2017-07-15: 20 mg via INTRAVENOUS
  Filled 2017-07-15: qty 2

## 2017-07-15 MED ORDER — LEVOTHYROXINE SODIUM 100 MCG IV SOLR
56.0000 ug | Freq: Every day | INTRAVENOUS | Status: DC
  Administered 2017-07-15 – 2017-07-20 (×6): 56 ug via INTRAVENOUS
  Filled 2017-07-15 (×6): qty 5

## 2017-07-15 MED ORDER — SODIUM CHLORIDE 0.9 % IV SOLN
INTRAVENOUS | Status: DC
  Administered 2017-07-15 – 2017-07-16 (×2): via INTRAVENOUS
  Filled 2017-07-15 (×3): qty 1000

## 2017-07-15 NOTE — Evaluation (Signed)
Clinical/Bedside Swallow Evaluation Patient Details  Name: [name] MRN: [id number] Date of Birth: 1926-05-13  Today's Date: 07/15/2017 Time: SLP Start Time (ACUTE ONLY): 1527 SLP Stop Time (ACUTE ONLY): 1540 SLP Time Calculation (min) (ACUTE ONLY): 13 min  Past Medical History:  Past Medical History:  Diagnosis Date  . Arthritis   . Diverticulitis 07/01/2011  . Diverticulosis   . Diverticulosis of sigmoid colon 05/09/2014  . DJD (degenerative joint disease)   . Fibromyalgia   . Hypothyroidism   . Osteoarthritis    Past Surgical History:  Past Surgical History:  Procedure Laterality Date  . ABDOMINAL ADHESION SURGERY    . ABDOMINAL HYSTERECTOMY    . COLONOSCOPY  2011   [name]  . ECTOPIC PREGNANCY SURGERY    . KNEE ARTHROPLASTY    . LAPAROTOMY N/A 05/12/2014   Procedure: OPEN EXPLORATORY LAPAROTOMY, SIGMOID COLECTOMY, LYSIS OF ADHESIONS, SEROSAL REPAIR, COLOSTOMY;  Surgeon: [name], MD;  Location: [facility];  Service: General;  Laterality: N/A;   HPI:  Pt is an [age] y/ofemale admitted from SNF s/p fall with a L knee laceration and sternal body fx wtih displacement. CT Chest showed a partial consolidation in the LLL that may reflect PNA or aspiration, possible debris or mucus in the LLL bronchi. PMH includes dementia, osteoarthritis, hypothyroidism, fibromyalgia, DJD, diverticulosis   Assessment / Plan / Recommendation Clinical Impression  PO trials were limited by pt's cognitive status and participation level. She does request food and opens her mouth to eat a few bites of puree, but simultaneously swinging at me and trying to swat me away. She has mildly slow oral transit, but no overt signs of aspiration with purees. Unfortunately I could not get her to take in any liquids. It's not clear what diet would be appropriate, and I'm not sure pt would have much intake regardless of diet textures at least at this point in time.   She is at risk for aspiration  given her mentation and her respiratory status, and her CT is suspicious for aspiration, but I do not think she would be able to participate in any instrumental testing at this time. SLP will f/u for potentially a better clinical assessment. In the meantime, will defer diet recommendations to MD. She could be trialed on a diet to see how she does when she possibly has more intake, or a more conservative diet could be considered (such as Dys 1 and thickened liquids) as a precaution given her aforementioned risk factors.  SLP Visit Diagnosis: Dysphagia, unspecified (R13.10)    Aspiration Risk  Moderate aspiration risk    Diet Recommendation (defer to MD at this time)   Medication Administration: Other (Comment)(crushed in puree versus alternative means)    Other  Recommendations Oral Care Recommendations: Oral care QID   Follow up Recommendations Skilled Nursing facility      Frequency and Duration min 2x/week  2 weeks       Prognosis Prognosis for Safe Diet Advancement: Fair Barriers to Reach Goals: Cognitive deficits;Behavior      Swallow Study   General HPI: Pt is an [age] y/ofemale admitted from SNF s/p fall with a L knee laceration and sternal body fx wtih displacement. CT Chest showed a partial consolidation in the LLL that may reflect PNA or aspiration, possible debris or mucus in the LLL bronchi. PMH includes dementia, osteoarthritis, hypothyroidism, fibromyalgia, DJD, diverticulosis Type of Study: Bedside Swallow Evaluation Previous Swallow Assessment: none in chart Diet Prior to this Study:  NPO Temperature Spikes Noted: No Respiratory Status: Nasal cannula History of Recent Intubation: No Behavior/Cognition: Alert;Agitated;Doesn't follow directions Oral Cavity Assessment: Other (comment)(UTA) Oral Care Completed by SLP: (unable to perform) Oral Cavity - Dentition: (UTA) Self-Feeding Abilities: Total assist Patient Positioning: Upright in bed Baseline Vocal Quality:  Normal Volitional Cough: Cognitively unable to elicit Volitional Swallow: Unable to elicit    Oral/Motor/Sensory Function Overall Oral Motor/Sensory Function: Other (comment)(UTA)   Ice Chips Ice chips: Not tested   Thin Liquid Thin Liquid: Not tested    Nectar Thick Nectar Thick Liquid: Not tested   Honey Thick Honey Thick Liquid: Not tested   Puree Puree: Impaired Presentation: Spoon Oral Phase Functional Implications: Prolonged oral transit   Solid   GO   Solid: Not tested        Maxcine Hamaiewonsky, Audryana Hockenberry 07/15/2017,4:21 PM  Maxcine HamLaura Paiewonsky, M.A. CCC-SLP [phone]

## 2017-07-15 NOTE — Evaluation (Signed)
Physical Therapy Evaluation and D/C  Patient Details Name: [name] MRN: [id number] DOB: 1926/01/17 Today's Date: 07/15/2017   History of Present Illness  [name] is an 83yo female PMH [facility] where she was found down lying beside her bed. Patient was complaining of chest and left knee pain. She is alert and does not appear in any acute distress. She cannot tell me how she injured herself. History limited due to dementia. She was found to have a left knee laceration, repaired in the ED, and no underlying fracture on xray. She was also found to have a sternal body fracture with displacement, CT scan pending.   Clinical Impression  Pt admitted with above diagnosis. Pt currently with functional limitations but at baseline as [facility]   Pt was unable to participate in PT.  Not appropriate for PT services.  Will sign off.  Follow Up Recommendations SNF;Supervision/Assistance - 24 hour    Equipment Recommendations  None recommended by PT    Recommendations for Other Services       Precautions / Restrictions Precautions Precautions: Fall Restrictions Weight Bearing Restrictions: No      Mobility  Bed Mobility Overal bed mobility: Needs Assistance Bed Mobility: Rolling;Supine to Sit Rolling: Total assist   Supine to sit: Total assist     General bed mobility comments: Pt would not follow commands.  Hit therapist with attempts to get pt up or adjust lines or with any attempts at actitivity.   Transfers                 General transfer comment: unable  Ambulation/Gait             General Gait Details: unable  Stairs            Wheelchair Mobility    Modified Rankin (Stroke Patients Only)       Balance                                             Pertinent Vitals/Pain Pain Assessment: No/denies pain    Home Living Family/patient expects to be  discharged to:: Skilled nursing facility                      Prior Function Level of Independence: Needs assistance         Comments: Pt not appropriate due to hitting, biting and spitting.      Hand Dominance        Extremity/Trunk Assessment   Upper Extremity Assessment Upper Extremity Assessment: (moves hand functionally hitting at staff)    Lower Extremity Assessment Lower Extremity Assessment: RLE deficits/detail;LLE deficits/detail RLE Deficits / Details: slight contracture cannot get to neutral - pt not cooperative; ROM limited by pts resistance LLE Deficits / Details: slight contracture cannot get to neutral - pt not cooperative; ROM limited by pts resistance     Cervical / Trunk Assessment Cervical / Trunk Assessment: Kyphotic  Communication   Communication: HOH  Cognition Arousal/Alertness: Awake/alert Behavior During Therapy: Agitated;Impulsive Overall Cognitive Status: History of cognitive impairments - at baseline                                 General Comments: hx of  dementia per chart      General Comments General comments (skin integrity, edema, etc.): cursing at staff    Exercises     Assessment/Plan    PT Assessment Patent does not need any further PT services  PT Problem List         PT Treatment Interventions      PT Goals (Current goals can be found in the Care Plan section)  Acute Rehab PT Goals Patient Stated Goal: unable to state PT Goal Formulation: All assessment and education complete, DC therapy    Frequency     Barriers to discharge        Co-evaluation               AM-PAC PT "6 Clicks" Daily Activity  Outcome Measure Difficulty turning over in bed (including adjusting bedclothes, sheets and blankets)?: Unable Difficulty moving from lying on back to sitting on the side of the bed? : Unable Difficulty sitting down on and standing up from a chair with arms (e.g., wheelchair, bedside  commode, etc,.)?: Unable Help needed moving to and from a bed to chair (including a wheelchair)?: Total Help needed walking in hospital room?: Total Help needed climbing 3-5 steps with a railing? : Total 6 Click Score: 6    End of Session Equipment Utilized During Treatment: Oxygen Activity Tolerance: Treatment limited secondary to agitation(self limiting) Patient left: in bed;with call bell/phone within reach;with bed alarm set Nurse Communication: Mobility status;Need for lift equipment PT Visit Diagnosis: Muscle weakness (generalized) (M62.81)    Time: [id number] PT Time Calculation (min) (ACUTE ONLY): 9 min   Charges:   PT Evaluation $PT Eval Low Complexity: 1 Low     PT G Codes:   PT G-Codes **NOT FOR INPATIENT CLASS** Functional Assessment Tool Used: AM-PAC 6 Clicks Basic Mobility Functional Limitation: Mobility: Walking and moving around Mobility: Walking and Moving Around Current Status ([id number](G8978): At least 80 percent but less than 100 percent impaired, limited or restricted Mobility: Walking and Moving Around Goal Status [phone](G8979): At least 80 percent but less than 100 percent impaired, limited or restricted Mobility: Walking and Moving Around Discharge Status [phone](G8980): At least 80 percent but less than 100 percent impaired, limited or restricted    [facility]portDawn Lennox Leikam,PT Acute Rehabilitation [phone] (pager)   Berline Lopesawn F Croix Presley 07/15/2017, 2:38 PM

## 2017-07-15 NOTE — Progress Notes (Addendum)
OT Cancellation Note  Patient Details Name: [name] MRN: [id number] DOB: 1926/02/17   Cancelled Treatment:    Reason Eval/Treat Not Completed: OT screened, no needs identified, will sign off([facility], plans to [facility] to SNF). Also spoke with PT who attempted to see Pt, and at this time, Pt not behaviorally appropriate for therapy. At this time, we will defer OT evaluation to SNF.    Evern BioLaura J Brendaliz Kuk 07/15/2017, 3:25 PM  Sherryl MangesLaura Cordney Barstow OTR/L [phone]

## 2017-07-15 NOTE — Progress Notes (Signed)
Pt placed on high-flow nasal cannula at 10 L/min.  Sats increased to 86%.  Still staying low.  RT up to see another pt and asked about maximum L/min.  States can go to 15 L/min.

## 2017-07-15 NOTE — Progress Notes (Signed)
Patient ID: [name], female   DOB: [birth year], 83 y.o.   MRN: [id number]  [facility] Progress Note     Subjective: CC-  Patient awake this morning. Per RN she has been combative, placed in mitten restraints. Takes 3 people to examine the patient. She does not verbalize any new complaints. Calms down when we leave the room.  Objective: Vital signs in last 24 hours: Temp:  [97.3 F (36.3 C)-98.8 F (37.1 C)] 97.3 F (36.3 C) (12/19 0739) Pulse Rate:  [72-99] 86 (12/19 0400) Resp:  [14-28] 21 (12/19 0400) BP: (98-138)/(58-96) 98/60 (12/19 0739) SpO2:  [84 %-96 %] 93 % (12/19 0400) Weight:  [127 lb 10.3 oz (57.9 kg)] 127 lb 10.3 oz (57.9 kg) (12/18 2000) Last BM Date: (UTA)  Intake/Output from previous day: 12/18 0701 - 12/19 0700 In: 1012.5 [I.V.:960; IV Piggyback:52.5] Out: 600 [Urine:600] Intake/Output this shift: No intake/output data recorded.  PE: Gen:  Alert, NAD, in mitten restraints HEENT: EOM's intact, pupils equal and round Card:  RRR, no M/G/R heard Pulm:  Few bilateral rhonchi, no wheezes, effort normal. Ecchymosis and deformity noted to anterior chest Abd: Soft, NT/ND, +BS, ostomy LLQ pink with no stool in bag Ext:  No calf swelling or edema LLE: left knee lac s/p repair with sutures intact, no erythema or drainage Psych: alert but not oriented Skin: warm and dry  Lab Results:  Recent Labs    07/14/17 1222 07/14/17 1450  WBC 14.9* 13.3*  HGB 12.6 11.3*  HCT 38.8 34.9*  PLT 438* 388   BMET Recent Labs    07/14/17 1222 07/14/17 1450  NA 131*  --   K 3.7  --   CL 95*  --   CO2 26  --   GLUCOSE 112*  --   BUN 16  --   CREATININE 0.35* 0.41*  CALCIUM 8.8*  --    PT/INR Recent Labs    07/14/17 1222  LABPROT 13.2  INR 1.01   CMP     Component Value Date/Time   NA 131 (L) 07/14/2017 1222   K 3.7 07/14/2017 1222   CL 95 (L) 07/14/2017 1222   CO2 26 07/14/2017 1222   GLUCOSE 112 (H) 07/14/2017 1222   BUN 16 07/14/2017  1222   CREATININE 0.41 (L) 07/14/2017 1450   CALCIUM 8.8 (L) 07/14/2017 1222   PROT 6.4 05/09/2014 0510   ALBUMIN 3.3 (L) 05/09/2014 0510   AST 27 05/09/2014 0510   ALT 17 05/09/2014 0510   ALKPHOS 58 05/09/2014 0510   BILITOT 0.7 05/09/2014 0510   GFRNONAA >60 07/14/2017 1450   GFRAA >60 07/14/2017 1450   Lipase     Component Value Date/Time   LIPASE 60 (H) 05/08/2014 1608       Studies/Results: Dg Chest 2 View  Result Date: 07/14/2017 CLINICAL DATA:  83 year old female found down. Chest ecchymosis. Leg laceration. EXAM: CHEST  2 VIEW COMPARISON:  Chest and sternum radiographs today reported separately. Chest radiographs 05/15/2014. CT Abdomen and Pelvis 05/08/2014, and earlier FINDINGS: semi upright AP and lateral views of the chest. Chronic large gastric hiatal hernia, eccentric to the right. Stable cardiomegaly and mediastinal contours. Chronic hypo ventilation at the left lung base, but improved compared to the 2015 portable exam. No superimposed pneumothorax or pulmonary edema. Small left pleural effusion is difficult to exclude. No confluent opacity in the right lung. Osteopenia. No definite acute osseous abnormality identified in the chest. Visible bowel gas pattern is improved compared to  2015. IMPRESSION: 1. Chronic hiatal hernia and hypo ventilation at the left lung base. Small left pleural effusion is difficult to exclude, but otherwise no acute cardiopulmonary abnormality. 2. Osteopenia.  No definite acute traumatic injury identified. Electronically Signed   By: Odessa FlemingH  Hall M.D.   On: 07/14/2017 09:04   Dg Sternum  Result Date: 07/14/2017 CLINICAL DATA:  Chest contusion.  Likely fall out of bed. EXAM: STERNUM - 2+ VIEW COMPARISON:  Chest x-ray dated May 08, 2014. FINDINGS: There is a fracture of the sternal body with anterior displacement by 1 bone shaft width and 1.9 cm of overriding fragments. There is overlying soft tissue swelling. Stable cardiomediastinal silhouette.   Large hiatal hernia. IMPRESSION: Acute fracture of the sternal body with anterior displacement and 1.9 cm of overriding fragments. Electronically Signed   By: Obie DredgeWilliam T Derry M.D.   On: 07/14/2017 09:03   Dg Pelvis 1-2 Views  Result Date: 07/14/2017 CLINICAL DATA:  83 year old female found down.  Leg laceration. EXAM: PELVIS - 1-2 VIEW COMPARISON:  CT Abdomen and Pelvis 05/08/2014, and earlier. FINDINGS: Osteopenia. Upper pelvis detail is limited by overlying bowel gas. No acute pelvic fracture is identified. Partially visible dextroconvex lumbar scoliosis. There is bulky curvilinear dystrophic appearing calcification projecting over the medial aspect of both hip joints which is new since 2015. Grossly intact proximal femurs. IMPRESSION: Osteopenia. No acute fracture or dislocation identified about the pelvis. If occult fracture is suspected or if the patient is unable to weightbear, MRI is the preferred modality for further evaluation. Electronically Signed   By: Odessa FlemingH  Hall M.D.   On: 07/14/2017 09:00   Ct Chest W Contrast  Result Date: 07/14/2017 CLINICAL DATA:  Chest pain, sternal fracture EXAM: CT CHEST WITH CONTRAST TECHNIQUE: Multidetector CT imaging of the chest was performed during intravenous contrast administration. CONTRAST:  75mL ISOVUE-300 IOPAMIDOL (ISOVUE-300) INJECTION 61% COMPARISON:  Radiograph 07/14/2017, 05/15/2014, CT abdomen pelvis 05/08/2014 FINDINGS: Kyphotic habitus of the patient results in unconventional scanning planes and limits evaluation. Cardiovascular: Nonaneurysmal aorta. Aortic atherosclerosis. Coronary artery calcification. Borderline cardiomegaly. No large pericardial effusion. Mediastinum/Nodes: Midline trachea. Possible debris or mucous plugging in left lower lobe bronchi. Air distention of the esophagus with moderate to large hiatal hernia. No evidence for mediastinal fluid collection or hematoma. 9 mm subcarinal lymph node. Lungs/Pleura: Nodular scarring in the apices.  No pleural effusion or pneumothorax. Partial consolidation in the left lower lobe. Upper Abdomen: Nodular thickening of the left adrenal gland. Low-density lesions within the left kidney. Musculoskeletal: Acute appearing fracture of the mid sternal body with apex anterior angular deformity at the fracture site. About 1 shaft bone with anterior displacement of distal fracture fragment with about 7 mm of overriding. Mild adjacent soft tissue stranding. Old bilateral rib fractures. Marked kyphosis of the upper thoracic spine. Degenerative changes. Anterolisthesis of C7 on T1 and C5 on C6. No high-grade compression deformity is evident. IMPRESSION: 1. Study is limited by kyphotic body habitus which results in non conventional imaging plane. 2. Acute, displaced and angulated fracture involving the mid sternal body with minimal surrounding edema. 3. Negative for mediastinal hematoma or pneumothorax. 4. Partial consolidation in the left lower lobe may reflect pneumonia or aspiration. Possible debris or mucus in the left lower lobe bronchi. Aortic Atherosclerosis (ICD10-I70.0). Electronically Signed   By: Jasmine PangKim  Fujinaga M.D.   On: 07/14/2017 15:03   Dg Knee Complete 4 Views Left  Result Date: 07/14/2017 CLINICAL DATA:  Left leg laceration below-knee EXAM: LEFT KNEE - COMPLETE 4+ VIEW  COMPARISON:  None. FINDINGS: No fracture or dislocation is seen. Moderate to severe degenerative changes, most prominent in the lateral and patellofemoral compartments. Soft tissue laceration in the suprapatellar region on the lateral view. No radiopaque foreign body is seen. IMPRESSION: Suprapatellar soft tissue laceration. No fracture, dislocation, or radiopaque foreign body is seen. Electronically Signed   By: Charline Bills M.D.   On: 07/14/2017 09:01    Anti-infectives: Anti-infectives (From admission, onward)   None       Assessment/Plan Atrial fibrillation not on anticoagulation Hypothyroidism - synthroid IV qd   HTN - BP soft 98/60, hold home meds Dementia - IV valproate, hold buspar until swallow eval Code status DNR - per paperwork from SNF, patient unable to confirm  Unwitnessed fall Sternal fx - with displacement, CT without hematoma or pneumothorax. Continue pain control and pneumothorax L knee laceration - xray negative for fx, repaired in ED 12/18. Dry dressing PRN  ID - none VTE - SCDs, heparin FEN - IVF, NPO Foley - none  Plan - ST consult for swallow eval. Labs pending this AM. PT/OT pending.   LOS: 0 days    Franne Forts , [facility] Surgery 07/15/2017, 8:11 AM Pager: [phone] Consults: [phone] Mon-Fri 7:00 am-4:30 pm Sat-Sun 7:00 am-11:30 am

## 2017-07-15 NOTE — Clinical Social Work Note (Signed)
Clinical Social Work Assessment  Patient Details  Name: [name] MRN: [id number] Date of Birth: [birth year]  Date of referral:  07/15/17               Reason for consult:  Facility Placement                Permission sought to share information with:  Facility Medical sales representativeContact Representative, Family Supports Permission granted to share information::  Yes, Release of Information Signed(HCPOA paperwork for Shelby KatzMelanie Munoz)  Name::     Shelby KatzMelanie Munoz  Agency::  [facility]  Relationship::  friend/HCPOA  Contact Information:     Housing/Transportation Living arrangements for the past 2 months:  Skilled Building surveyorursing Facility Source of Information:  Power of Pensions consultantAttorney, Engineer, miningriend/Neighbor Patient Interpreter Needed:  None Criminal Activity/Legal Involvement Pertinent to Current Situation/Hospitalization:  No - Comment as needed Significant Relationships:  Friend, Other(Comment)(cousins) Lives with:  Facility Resident Do you feel safe going back to the place where you live?  No Need for family participation in patient care:  Yes (Comment)(decision making)  Care giving concerns:  [facility]- [facility].    HCPOA has some concerns about how is responsible for pt decision making at this time- is not aware if there is another legal POA but states that family has been hard to deal with and so she has allowed them to make decisions over past year and has taken a step back- states that the pt family was trying to get her to make decisions that are bad for the pt with the hope she will pass quicker so they can get their inheritance.  She will discuss with her spouse if she wants to continue making decisions or if she will continue to allow family to make decisions- states she believes Shelby MeekSusie Munoz has been making decisions (pt cousin)- will call CSW back with number.   Social Worker assessment / plan:  CSW spoke with pt HCPOA who confirms [facility] and plan is to return.  Employment status:  Retired Health and safety inspectornsurance information:  Medicare PT Recommendations:  Skilled Nursing Facility Information / Referral to community resources:  Skilled Nursing Facility  Patient/Family's Response to care:  Agreeable to return to [facility] but unsure if she will continue to make decisions for patient.  Patient/Family's Understanding of and Emotional Response to Diagnosis, Current Treatment, and Prognosis:  See above  Emotional Assessment Appearance:  Appears stated age Attitude/Demeanor/Rapport:  Combative Affect (typically observed):  Unable to Assess Orientation:  Fluctuating Orientation (Suspected and/or reported Sundowners) Alcohol / Substance use:  Not Applicable Psych involvement (Current and /or in the community):  No (Comment)  Discharge Needs  Concerns to be addressed:  Care Coordination Readmission within the last 30 days:  No Current discharge risk:  Physical Impairment Barriers to Discharge:  Continued Medical Work up   Shelby SisUris, [name] H, LCSW 07/15/2017, 4:14 PM

## 2017-07-15 NOTE — Progress Notes (Signed)
O2 was increased to 15L/min [place] and RT assisted to change pulse ox.  Sats have increased to 90's.  Will continue to monitor.

## 2017-07-15 NOTE — Progress Notes (Signed)
Have been unable to do mouth care today because every time I try, pt starts hitting and trying to bite this staff member.  Has been trying to hit staff all day and cursing at staff.  Will not let you touch anything on bed, even controls, without trying to hit or slap or bite.  If unable to do those things, starts cursing at staff.  Will continue to monitor.

## 2017-07-15 NOTE — NC FL2 (Signed)
[place] MEDICAID FL2 LEVEL OF CARE SCREENING TOOL     IDENTIFICATION  Patient Name: [name] Birthdate: [birth year] Sex: female Admission Date (Current Location): 07/14/2017  [facility] [place]   Facility and Address:  [facility], [address], [place], [place] [address]      Provider Number: [id number]  Attending Physician Name and Address:  Md, Trauma, MD  Relative Name and Phone Number:       Current Level of Care: Hospital Recommended Level of Care: Skilled Nursing Facility Prior Approval Number:    Date Approved/Denied:   PASRR Number: [id number]  Discharge Plan: SNF    Current Diagnoses: Patient Active Problem List   Diagnosis Date Noted  . Pressure injury of skin 07/15/2017  . Sternal fracture 07/14/2017  . Hypomagnesemia 05/19/2014  . Klebsiella cystitis 05/19/2014  . Malnutrition of moderate degree (HCC) 05/18/2014  . Atrial fibrillation with RVR (HCC) 05/17/2014  . Shock circulatory (HCC) 05/14/2014  . HOH (hard of hearing) 05/10/2014  . Dementia 05/10/2014  . 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 Diverticulosis of sigmoid colon 05/09/2014  . Fibromyalgia   . History of GI diverticular bleed 09/24/2012  . Anemia 09/22/2012  . Shortness of breath 09/22/2012  . Normocytic anemia 07/02/2011  . Hypokalemia 07/02/2011  . Hypothyroidism 07/01/2011  . Hearing loss 07/01/2011    Orientation RESPIRATION BLADDER Height & Weight        O2(2L [place]) Incontinent, External catheter Weight: 127 lb 10.3 oz (57.9 kg) Height:  5' 2.01" (157.5 cm)  BEHAVIORAL SYMPTOMS/MOOD NEUROLOGICAL BOWEL NUTRITION STATUS      Incontinent, Colostomy(placed 05/12/14)) Diet(see DC summary)  AMBULATORY STATUS COMMUNICATION OF NEEDS Skin   Extensive Assist Verbally PU Stage and Appropriate Care   PU Stage 2 Dressing: (located on sacrum foam dressing changes PRN)                   Personal Care Assistance Level of Assistance  Bathing, Dressing Bathing Assistance: Maximum assistance   Dressing Assistance: Maximum assistance     Functional Limitations Info             SPECIAL CARE FACTORS FREQUENCY  PT (By licensed PT), OT (By licensed OT)     PT Frequency: 5/wk OT Frequency: 5/wk            Contractures      Additional Factors Info  Code Status, Allergies, Psychotropic Code Status Info: DNR Allergies Info: Antihistamines, Chlorpheniramine-type, Codeine Psychotropic Info: buspar, zoloft         Current Medications (07/15/2017):  This is the current hospital active medication list Current Facility-Administered Medications  Medication Dose Route Frequency Provider Last Rate Last Dose  . acetaminophen (TYLENOL) tablet 1,000 mg  1,000 mg Oral Once Arby BarrettePfeiffer, Marcy, MD      . acetaminophen (TYLENOL) tablet 650 mg  650 mg Oral Q4H PRN Meuth, Brooke A, PA-C      . busPIRone (BUSPAR) tablet 15 mg  15 mg Oral BID Meuth, Brooke A, PA-C   Stopped at 07/14/17 1758  . chlorhexidine (PERIDEX) 0.12 % solution 15 mL  15 mL Mouth Rinse BID Manus Ruddsuei, Matthew, MD   15 mL at 07/14/17 2301  . cholecalciferol (VITAMIN D) tablet 1,000 Units  1,000 Units Oral Daily Meuth, Brooke A, PA-C   1,000 Units at 07/14/17 1726  . docusate sodium (COLACE) capsule 100 mg  100 mg Oral BID Carlena BjornstadMeuth, Brooke  A, PA-C   100 mg at 07/14/17 1524  . fluticasone (FLONASE) 50 MCG/ACT nasal spray 2 spray  2 spray Each Nare Daily Meuth, Brooke A, PA-C   2 spray at 07/14/17 1527  . heparin injection 5,000 Units  5,000 Units Subcutaneous Q8H Meuth, Brooke A, PA-C   5,000 Units at 07/15/17 1439  . hydrALAZINE (APRESOLINE) injection 2 mg  2 mg Intravenous BID Manus Ruddsuei, Matthew, MD      . levothyroxine (SYNTHROID, LEVOTHROID) injection 56 mcg  56 mcg Intravenous QAC breakfast Meuth, Brooke A, PA-C   56 mcg at 07/15/17 0945  . magnesium oxide (MAG-OX) tablet 400 mg  400 mg Oral Daily  Meuth, Brooke A, PA-C   400 mg at 07/14/17 1524  . MEDLINE mouth rinse  15 mL Mouth Rinse q12n4p Manus Ruddsuei, Matthew, MD      . methocarbamol (ROBAXIN) 500 mg in dextrose 5 % 50 mL IVPB  500 mg Intravenous Q8H PRN Manus Ruddsuei, Matthew, MD      . metoprolol tartrate (LOPRESSOR) injection 5 mg  5 mg Intravenous Q6H PRN Meuth, Brooke A, PA-C      . ondansetron (ZOFRAN-ODT) disintegrating tablet 4 mg  4 mg Oral Q6H PRN Meuth, Brooke A, PA-C       Or  . ondansetron (ZOFRAN) injection 4 mg  4 mg Intravenous Q6H PRN Meuth, Brooke A, PA-C      . oxybutynin (DITROPAN) tablet 5 mg  5 mg Oral QHS Meuth, Brooke A, PA-C      . pantoprazole (PROTONIX) EC tablet 40 mg  40 mg Oral Daily Meuth, Brooke A, PA-C       Or  . pantoprazole (PROTONIX) injection 40 mg  40 mg Intravenous Daily Meuth, Brooke A, PA-C   40 mg at 07/15/17 0941  . potassium chloride SA (K-DUR,KLOR-CON) CR tablet 60 mEq  60 mEq Oral BID Meuth, Brooke A, PA-C      . sertraline (ZOLOFT) tablet 25 mg  25 mg Oral Daily Meuth, Brooke A, PA-C   25 mg at 07/14/17 1534  . simethicone (MYLICON) chewable tablet 80 mg  80 mg Oral BID Meuth, Brooke A, PA-C   80 mg at 07/14/17 1726  . sodium chloride 0.9 % 1,000 mL infusion   Intravenous Continuous Meuth, Brooke A, PA-C 50 mL/hr at 07/15/17 1447    . spironolactone (ALDACTONE) tablet 12.5 mg  12.5 mg Oral Daily Meuth, Brooke A, PA-C   12.5 mg at 07/14/17 1528  . traMADol (ULTRAM) tablet 50 mg  50 mg Oral Q6H PRN Meuth, Brooke A, PA-C      . valproate (DEPACON) 250 mg in dextrose 5 % 50 mL IVPB  250 mg Intravenous Q12H Manus Ruddsuei, Matthew, MD   Stopped at 07/15/17 1120     Discharge Medications: Please see discharge summary for a list of discharge medications.  Relevant Imaging Results:  Relevant Lab Results:   Additional Information SS#: [id number]  Burna SisUris, Halsey Hammen H, LCSW

## 2017-07-16 ENCOUNTER — Inpatient Hospital Stay (HOSPITAL_COMMUNITY): Payer: Medicare Other

## 2017-07-16 LAB — BASIC METABOLIC PANEL
ANION GAP: 10 (ref 5–15)
BUN: 12 mg/dL (ref 6–20)
CO2: 23 mmol/L (ref 22–32)
Calcium: 8 mg/dL — ABNORMAL LOW (ref 8.9–10.3)
Chloride: 96 mmol/L — ABNORMAL LOW (ref 101–111)
Creatinine, Ser: 0.53 mg/dL (ref 0.44–1.00)
GFR calc Af Amer: >60 mL/min (ref >60)
GLUCOSE: 85 mg/dL (ref 65–99)
POTASSIUM: 3 mmol/L — AB (ref 3.5–5.1)
Sodium: 129 mmol/L — ABNORMAL LOW (ref 135–145)

## 2017-07-16 LAB — CBC
HEMATOCRIT: 38.2 % (ref 36.0–46.0)
HEMOGLOBIN: 12.2 g/dL (ref 12.0–15.0)
MCH: 28.4 pg (ref 26.0–34.0)
MCHC: 31.9 g/dL (ref 30.0–36.0)
MCV: 89 fL (ref 78.0–100.0)
Platelets: 368 10*3/uL (ref 150–400)
RBC: 4.29 MIL/uL (ref 3.87–5.11)
RDW: 15.3 % (ref 11.5–15.5)
WBC: 17.9 10*3/uL — ABNORMAL HIGH (ref 4.0–10.5)

## 2017-07-16 MED ORDER — DEXTROSE 5 % IV SOLN
2.0000 g | INTRAVENOUS | Status: DC
  Administered 2017-07-16 – 2017-07-20 (×5): 2 g via INTRAVENOUS
  Filled 2017-07-16 (×5): qty 2

## 2017-07-16 MED ORDER — POTASSIUM CHLORIDE 10 MEQ/100ML IV SOLN: 10.0000 meq | INTRAVENOUS | Status: DC

## 2017-07-16 MED ORDER — POTASSIUM CHLORIDE 20 MEQ PO PACK
20.0000 meq | PACK | Freq: Two times a day (BID) | ORAL | Status: DC
  Administered 2017-07-16 (×2): 20 meq via ORAL
  Filled 2017-07-16 (×3): qty 1

## 2017-07-16 MED ORDER — FUROSEMIDE 10 MG/ML IJ SOLN
20.0000 mg | Freq: Once | INTRAMUSCULAR | Status: AC
  Administered 2017-07-16: 20 mg via INTRAVENOUS
  Filled 2017-07-16: qty 2

## 2017-07-16 NOTE — Care Management Note (Signed)
Case Management Note  Patient Details  Name: [name] MRN: [id number] Date of Birth: Apr 21, 1926  Subjective/Objective:   Pt admitted on 07/14/17 s/p fall with sternal fx and knee laceration.  PTA, [facility]; she has significant dementia.                  Action/Plan: Pt is a DNR, per HCPOA.  CSW consulted to facilitate return to SNF upon medical stability.    Expected Discharge Date:  (unknown)               Expected Discharge Plan:  Skilled Nursing Facility  In-House Referral:  Clinical Social Work  Discharge planning Services  CM Consult  Post Acute Care Choice:    Choice offered to:     DME Arranged:    DME Agency:     HH Arranged:    HH Agency:     Status of Service:  In process, will continue to follow  If discussed at Long Length of Stay Meetings, dates discussed:    Additional Comments:  Quintella BatonJulie W. Aricka Goldberger, RN, BSN  Trauma/Neuro ICU Case Manager [phone]

## 2017-07-16 NOTE — Progress Notes (Signed)
  Speech Language Pathology Treatment: Dysphagia  Patient Details Name: [name] MRN: [id number] DOB: [birth year] Today's Date: 07/16/2017 Time: [id number] SLP Time Calculation (min) (ACUTE ONLY): 8 min  Assessment / Plan / Recommendation Clinical Impression  Pt is a little more accepting of POs today, but still takes in minimal amounts before starting to swat me away. Upon SLP arrival she was orally holding her saliva, needing oral suction to clear her mouth. She consumed a few bites of puree and small amounts of thin and nectar thick liquids with no overt signs of aspiration observed. I do think that she has at least a cognitively-based dysphagia given the oral holding, and RN reports some coughing earlier today with lunch. MD may wish to consider more conservative diet, but Dys 1 diet would be similar textures to her current diet but would give her more variety in options and more nutritional value. Could consider Dys 1 diet and nectar thick liquids.   HPI HPI: Pt is an [age] y/ofemale admitted from SNF s/p fall with a L knee laceration and sternal body fx wtih displacement. CT Chest showed a partial consolidation in the LLL that may reflect PNA or aspiration, possible debris or mucus in the LLL bronchi. PMH includes dementia, osteoarthritis, hypothyroidism, fibromyalgia, DJD, diverticulosis      SLP Plan  Continue with current plan of care       Recommendations  Diet recommendations: Other(comment)(full nectar thick liquids per MD) Liquids provided via: Cup;Straw;Teaspoon Medication Administration: Crushed with puree Supervision: Full supervision/cueing for compensatory strategies;Staff to assist with self feeding Compensations: Minimize environmental distractions;Slow rate;Small sips/bites Postural Changes and/or Swallow Maneuvers: Seated upright 90 degrees                Oral Care Recommendations: Oral care BID Follow up Recommendations: Skilled Nursing facility SLP Visit  Diagnosis: Dysphagia, unspecified (R13.10) Plan: Continue with current plan of care       GO                Shelby Hamaiewonsky, [name] 07/16/2017, 2:42 PM  [name], M.A. CCC-SLP [phone]

## 2017-07-16 NOTE — Progress Notes (Signed)
Patient ID: Adelina Mingsellie M Selmon, female   DOB: 1925/09/16, 83 y.o.   MRN: [id number]  I was able to get a hold of [name], patient's cousin and healthcare power of attorney. She clarified that the patient wishes to be DNR/DNI, she "would not want anything done" beyond what she is currently receiving.  [name] can be reached at [phone] (cell) or [phone] (home).  Brooke A Meuth

## 2017-07-16 NOTE — Plan of Care (Signed)
Pt has been able to remain free from injury during this shift. Pt is on a low bed with floor mats at bedside due to pts high risk of fractures. Pt also received a bath this shift.  Pt has not been able to intake any nutrition during this shift.. Pt had approximately 3 bites of pudding earlier in the day. Will continue to monitor.

## 2017-07-16 NOTE — Progress Notes (Signed)
Patient ID: [name], female   DOB: 1926-07-10, 83 y.o.   MRN: [id number]  [facility]     Subjective: CC-  Patient placed on 15L O2 yesterday. She states "help me" but cannot specify what is bothering her. She denies any pain. Per RN if you write down what you are going to do she can read it and will cooperate.  No OP from ostomy and low UOP. BP 85/65. TMAX 99.3  Objective: Vital signs in last 24 hours: Temp:  [97.8 F (36.6 C)-99.3 F (37.4 C)] 98.9 F (37.2 C) (12/20 0300) Pulse Rate:  [36-102] 90 (12/20 0400) Resp:  [16-25] 24 (12/20 0400) BP: (85-130)/(46-91) 85/65 (12/20 0400) SpO2:  [88 %-95 %] 92 % (12/20 0400) Last BM Date: (UTA)  Intake/Output from previous day: 12/19 0701 - 12/20 0700 In: 1138.3 [I.V.:1033.3; IV Piggyback:105] Out: 700 [Urine:700] Intake/Output this shift: Total I/O In: -  Out: 200 [Urine:200]  PE: Gen:  Alert, NAD, in mitten restraints, cooperative when you write down questions for her HEENT: EOM's intact, pupils equal and round Card:  RRR, no M/G/R heard Pulm:  tachypneic, few bilateral rhonchi, no wheezes. On 15l [place]. Ecchymosis and deformity noted to anterior chest Abd: Soft, NT/ND, +BS, ostomy LLQ pink with no stool in bag Ext:  No calf swelling or edema LLE: left knee lac s/p repair with sutures intact, no erythema or drainage Psych: alert and oriented to self Skin: warm and dry   Lab Results:  Recent Labs    07/15/17 1126 07/16/17 0502  WBC 16.8* 17.9*  HGB 12.5 12.2  HCT 39.1 38.2  PLT 375 368   BMET Recent Labs    07/15/17 1126 07/16/17 0502  NA 123* 129*  K 3.5 3.0*  CL 91* 96*  CO2 23 23  GLUCOSE 97 85  BUN 10 12  CREATININE 0.43* 0.53  CALCIUM 8.2* 8.0*   PT/INR Recent Labs    07/14/17 1222  LABPROT 13.2  INR 1.01   CMP     Component Value Date/Time   NA 129 (L) 07/16/2017 0502   K 3.0 (L) 07/16/2017 0502   CL 96 (L) 07/16/2017 0502   CO2 23 07/16/2017 0502   GLUCOSE 85 07/16/2017 0502   BUN 12 07/16/2017 0502   CREATININE 0.53 07/16/2017 0502   CALCIUM 8.0 (L) 07/16/2017 0502   PROT 6.4 05/09/2014 0510   ALBUMIN 3.3 (L) 05/09/2014 0510   AST 27 05/09/2014 0510   ALT 17 05/09/2014 0510   ALKPHOS 58 05/09/2014 0510   BILITOT 0.7 05/09/2014 0510   GFRNONAA >60 07/16/2017 0502   GFRAA >60 07/16/2017 0502   Lipase     Component Value Date/Time   LIPASE 60 (H) 05/08/2014 1608       Studies/Results: Dg Chest 2 View  Result Date: 07/14/2017 CLINICAL DATA:  83 year old female found down. Chest ecchymosis. Leg laceration. EXAM: CHEST  2 VIEW COMPARISON:  Chest and sternum radiographs today reported separately. Chest radiographs 05/15/2014. CT Abdomen and Pelvis 05/08/2014, and earlier FINDINGS: semi upright AP and lateral views of the chest. Chronic large gastric hiatal hernia, eccentric to the right. Stable cardiomegaly and mediastinal contours. Chronic hypo ventilation at the left lung base, but improved compared to the 2015 portable exam. No superimposed pneumothorax or pulmonary edema. Small left pleural effusion is difficult to exclude. No confluent opacity in the right lung. Osteopenia. No definite acute osseous abnormality identified in the chest. Visible bowel gas pattern is improved compared  to 2015. IMPRESSION: 1. Chronic hiatal hernia and hypo ventilation at the left lung base. Small left pleural effusion is difficult to exclude, but otherwise no acute cardiopulmonary abnormality. 2. Osteopenia.  No definite acute traumatic injury identified. Electronically Signed   By: Odessa Fleming M.D.   On: 07/14/2017 09:04   Dg Sternum  Result Date: 07/14/2017 CLINICAL DATA:  Chest contusion.  Likely fall out of bed. EXAM: STERNUM - 2+ VIEW COMPARISON:  Chest x-ray dated May 08, 2014. FINDINGS: There is a fracture of the sternal body with anterior displacement by 1 bone shaft width and 1.9 cm of overriding fragments. There is overlying soft tissue  swelling. Stable cardiomediastinal silhouette.  Large hiatal hernia. IMPRESSION: Acute fracture of the sternal body with anterior displacement and 1.9 cm of overriding fragments. Electronically Signed   By: Obie Dredge M.D.   On: 07/14/2017 09:03   Dg Pelvis 1-2 Views  Result Date: 07/14/2017 CLINICAL DATA:  83 year old female found down.  Leg laceration. EXAM: PELVIS - 1-2 VIEW COMPARISON:  CT Abdomen and Pelvis 05/08/2014, and earlier. FINDINGS: Osteopenia. Upper pelvis detail is limited by overlying bowel gas. No acute pelvic fracture is identified. Partially visible dextroconvex lumbar scoliosis. There is bulky curvilinear dystrophic appearing calcification projecting over the medial aspect of both hip joints which is new since 2015. Grossly intact proximal femurs. IMPRESSION: Osteopenia. No acute fracture or dislocation identified about the pelvis. If occult fracture is suspected or if the patient is unable to weightbear, MRI is the preferred modality for further evaluation. Electronically Signed   By: Odessa Fleming M.D.   On: 07/14/2017 09:00   Ct Chest W Contrast  Result Date: 07/14/2017 CLINICAL DATA:  Chest pain, sternal fracture EXAM: CT CHEST WITH CONTRAST TECHNIQUE: Multidetector CT imaging of the chest was performed during intravenous contrast administration. CONTRAST:  75mL ISOVUE-300 IOPAMIDOL (ISOVUE-300) INJECTION 61% COMPARISON:  Radiograph 07/14/2017, 05/15/2014, CT abdomen pelvis 05/08/2014 FINDINGS: Kyphotic habitus of the patient results in unconventional scanning planes and limits evaluation. Cardiovascular: Nonaneurysmal aorta. Aortic atherosclerosis. Coronary artery calcification. Borderline cardiomegaly. No large pericardial effusion. Mediastinum/Nodes: Midline trachea. Possible debris or mucous plugging in left lower lobe bronchi. Air distention of the esophagus with moderate to large hiatal hernia. No evidence for mediastinal fluid collection or hematoma. 9 mm subcarinal lymph  node. Lungs/Pleura: Nodular scarring in the apices. No pleural effusion or pneumothorax. Partial consolidation in the left lower lobe. Upper Abdomen: Nodular thickening of the left adrenal gland. Low-density lesions within the left kidney. Musculoskeletal: Acute appearing fracture of the mid sternal body with apex anterior angular deformity at the fracture site. About 1 shaft bone with anterior displacement of distal fracture fragment with about 7 mm of overriding. Mild adjacent soft tissue stranding. Old bilateral rib fractures. Marked kyphosis of the upper thoracic spine. Degenerative changes. Anterolisthesis of C7 on T1 and C5 on C6. No high-grade compression deformity is evident. IMPRESSION: 1. Study is limited by kyphotic body habitus which results in non conventional imaging plane. 2. Acute, displaced and angulated fracture involving the mid sternal body with minimal surrounding edema. 3. Negative for mediastinal hematoma or pneumothorax. 4. Partial consolidation in the left lower lobe may reflect pneumonia or aspiration. Possible debris or mucus in the left lower lobe bronchi. Aortic Atherosclerosis (ICD10-I70.0). Electronically Signed   By: Jasmine Pang M.D.   On: 07/14/2017 15:03   Dg Knee Complete 4 Views Left  Result Date: 07/14/2017 CLINICAL DATA:  Left leg laceration below-knee EXAM: LEFT KNEE - COMPLETE 4+  VIEW COMPARISON:  None. FINDINGS: No fracture or dislocation is seen. Moderate to severe degenerative changes, most prominent in the lateral and patellofemoral compartments. Soft tissue laceration in the suprapatellar region on the lateral view. No radiopaque foreign body is seen. IMPRESSION: Suprapatellar soft tissue laceration. No fracture, dislocation, or radiopaque foreign body is seen. Electronically Signed   By: Charline BillsSriyesh  Krishnan M.D.   On: 07/14/2017 09:01    Anti-infectives: Anti-infectives (From admission, onward)   None       Assessment/Plan Atrial fibrillation not on  anticoagulation Hypothyroidism - synthroid 56mcg IV qd  HTN- BP low 85/65, hold home meds Dementia- IV valproate, buspar Code status DNR  Unwitnessed fall Sternal fx- with displacement, CT without hematoma or pneumothorax. Continue pain control and pulmonary toilet L knee laceration- xray negative for fx, repaired in ED 12/18. Dry dressing PRN Hyponatremia - Na up to 129, continue IVF 0.9% NaCl at 2050mL/hr Hypokalemia - K 3.0, give potassium 20meq BID  ID -none VTE -SCDs, heparin FEN -IVF at 3350mL/hr, FLD (nectar thick) Foley -none  Plan -Check CXR. Replace potassium. Give 1 more dose of lasix 20mg . Advance to FLD (nectar thick). Attempted to call HCPOA Arsenio Katz([name] Jones [phone]) to clarify if patient is also DNI but could not reach her, will try again.   LOS: 1 day    Franne FortsBrooke A Ambrie Carte , [facility] 07/16/2017, 7:40 AM Pager: [phone] Consults: [phone] Mon-Fri 7:00 am-4:30 pm Sat-Sun 7:00 am-11:30 am   Addendum: CXR with concern for RLL pneumonia. Will obtain sputum and blood cultures and start on maxipime for HCAP.   Sarahlynn Cisnero A Joab Carden

## 2017-07-16 NOTE — Progress Notes (Signed)
Pharmacy Antibiotic Note  [name] is a 83 y.o. female admitted on 07/14/2017 with pneumonia.  Pharmacy has been consulted for cefepime dosing.  CXR shows progressive atelectasis or PNA at the bases.  Plan: Start cefepime 2g IV Q24h Monitor clinical picture, renal function F/U C&S, abx deescalation / LOT   Height: 5' 2.01" (157.5 cm) Weight: 127 lb 10.3 oz (57.9 kg) IBW/kg (Calculated) : 50.12  Temp (24hrs), Avg:98.2 F (36.8 C), Min:96.8 F (36 C), Max:99.3 F (37.4 C)  Recent Labs  Lab 07/14/17 1222 07/14/17 1450 07/15/17 1126 07/16/17 0502  WBC 14.9* 13.3* 16.8* 17.9*  CREATININE 0.35* 0.41* 0.43* 0.53    Estimated Creatinine Clearance: 37.7 mL/min (by C-G formula based on SCr of 0.53 mg/dL).    Allergies  Allergen Reactions  . Antihistamines, Chlorpheniramine-Type Other (See Comments)    headache  . Codeine     headache   Thank you for allowing pharmacy to be a part of this patient's care.  Armandina StammerBATCHELDER,[name] 07/16/2017 9:39 AM

## 2017-07-16 NOTE — Progress Notes (Signed)
Unable to provide mouth care to patient, as pt starts trying to hit and/or bite at this staff member. Pt continues to curse and swing at staff, refusing to let anyone do a full thorough assessment on her. Will continue to monitor.

## 2017-07-17 DIAGNOSIS — F0391 Unspecified dementia with behavioral disturbance: Secondary | ICD-10-CM

## 2017-07-17 DIAGNOSIS — S2222XA Fracture of body of sternum, initial encounter for closed fracture: Principal | ICD-10-CM

## 2017-07-17 DIAGNOSIS — Z7189 Other specified counseling: Secondary | ICD-10-CM

## 2017-07-17 DIAGNOSIS — Z515 Encounter for palliative care: Secondary | ICD-10-CM

## 2017-07-17 LAB — BASIC METABOLIC PANEL
ANION GAP: 7 (ref 5–15)
BUN: 16 mg/dL (ref 6–20)
CHLORIDE: 101 mmol/L (ref 101–111)
CO2: 26 mmol/L (ref 22–32)
Calcium: 8.5 mg/dL — ABNORMAL LOW (ref 8.9–10.3)
Creatinine, Ser: 0.56 mg/dL (ref 0.44–1.00)
GFR calc Af Amer: >60 mL/min (ref >60)
Glucose, Bld: 103 mg/dL — ABNORMAL HIGH (ref 65–99)
POTASSIUM: 3.3 mmol/L — AB (ref 3.5–5.1)
SODIUM: 134 mmol/L — AB (ref 135–145)

## 2017-07-17 LAB — CBC
HCT: 38.5 % (ref 36.0–46.0)
Hemoglobin: 12.5 g/dL (ref 12.0–15.0)
MCH: 28.6 pg (ref 26.0–34.0)
MCHC: 32.5 g/dL (ref 30.0–36.0)
MCV: 88.1 fL (ref 78.0–100.0)
PLATELETS: 347 10*3/uL (ref 150–400)
RBC: 4.37 MIL/uL (ref 3.87–5.11)
RDW: 15 % (ref 11.5–15.5)
WBC: 20.9 10*3/uL — AB (ref 4.0–10.5)

## 2017-07-17 MED ORDER — RESOURCE THICKENUP CLEAR PO POWD
ORAL | Status: DC | PRN
  Filled 2017-07-17 (×3): qty 125

## 2017-07-17 MED ORDER — HALOPERIDOL LACTATE 5 MG/ML IJ SOLN: 2.0000 mg | Freq: Four times a day (QID) | INTRAMUSCULAR | Status: DC | PRN

## 2017-07-17 MED ORDER — MORPHINE SULFATE (CONCENTRATE) 10 MG/0.5ML PO SOLN: 5.0000 mg | ORAL | Status: DC | PRN

## 2017-07-17 MED ORDER — LORAZEPAM 2 MG/ML IJ SOLN: 0.5000 mg | INTRAMUSCULAR | Status: DC | PRN

## 2017-07-17 MED ORDER — MORPHINE SULFATE (PF) 4 MG/ML IV SOLN
1.0000 mg | INTRAVENOUS | Status: DC | PRN
  Administered 2017-07-17: 1 mg via INTRAVENOUS
  Filled 2017-07-17: qty 1

## 2017-07-17 MED ORDER — ORAL CARE MOUTH RINSE
15.0000 mL | Freq: Two times a day (BID) | OROMUCOSAL | Status: DC
  Administered 2017-07-17 – 2017-07-19 (×5): 15 mL via OROMUCOSAL

## 2017-07-17 NOTE — Progress Notes (Signed)
Patient ID: [name], female   DOB: 1926/02/02, 83 y.o.   MRN: [id number]  [facility]     Subjective: CC-  Patient states that she is in no pain this morning. On high flow nasal canula, denies SOB.  Tolerating only few bites of food. No ostomy output.  Objective: Vital signs in last 24 hours: Temp:  [96.8 F (36 C)-98.2 F (36.8 C)] 97.4 F (36.3 C) (12/21 0300) Pulse Rate:  [78-99] 98 (12/21 0515) Resp:  [18-24] 24 (12/21 0515) BP: (77-110)/(44-60) 99/60 (12/21 0515) SpO2:  [88 %-98 %] 93 % (12/21 0515) Last BM Date: 07/15/17  Intake/Output from previous day: 12/20 0701 - 12/21 0700 In: 1475 [P.O.:120; I.V.:1200; IV Piggyback:155] Out: 800 [Urine:800] Intake/Output this shift: No intake/output data recorded.  PE: Gen: Alert, NAD, in mitten restraints, cooperative  HEENT: EOM's intact, pupils equal and round Card: RRR, no M/G/R heard Pulm: effort normal, bilateral rhonchi, no wheezes. On 15l [place]. Ecchymosis and deformity noted to anterior chest Abd: Soft, NT/ND, +BS,ostomy LLQ pink with no stool in bag Ext:No calf swelling or edema LLE: left knee lac s/p repair with sutures intact, no erythema or drainage Psych:alert and oriented to self Skin: warm and dry   Lab Results:  Recent Labs    07/16/17 0502 07/17/17 0356  WBC 17.9* 20.9*  HGB 12.2 12.5  HCT 38.2 38.5  PLT 368 347   BMET Recent Labs    07/16/17 0502 07/17/17 0356  NA 129* 134*  K 3.0* 3.3*  CL 96* 101  CO2 23 26  GLUCOSE 85 103*  BUN 12 16  CREATININE 0.53 0.56  CALCIUM 8.0* 8.5*   PT/INR Recent Labs    07/14/17 1222  LABPROT 13.2  INR 1.01   CMP     Component Value Date/Time   NA 134 (L) 07/17/2017 0356   K 3.3 (L) 07/17/2017 0356   CL 101 07/17/2017 0356   CO2 26 07/17/2017 0356   GLUCOSE 103 (H) 07/17/2017 0356   BUN 16 07/17/2017 0356   CREATININE 0.56 07/17/2017 0356   CALCIUM 8.5 (L) 07/17/2017 0356   PROT 6.4 05/09/2014 0510   ALBUMIN 3.3 (L) 05/09/2014 0510   AST 27 05/09/2014 0510   ALT 17 05/09/2014 0510   ALKPHOS 58 05/09/2014 0510   BILITOT 0.7 05/09/2014 0510   GFRNONAA >60 07/17/2017 0356   GFRAA >60 07/17/2017 0356   Lipase     Component Value Date/Time   LIPASE 60 (H) 05/08/2014 1608       Studies/Results: Dg Chest Port 1 View  Result Date: 07/16/2017 CLINICAL DATA:  Shortness of breath EXAM: PORTABLE CHEST 1 VIEW COMPARISON:  Two days ago FINDINGS: Limited due to kyphotic deformity. Atelectasis or pneumonia at the bases with small left effusion. Cardiomegaly. No generalized Kerley lines. Hiatal hernia. IMPRESSION: 1. Atelectasis or pneumonia at the bases that is progressed from 2 days prior. 2. Large hiatal hernia. Electronically Signed   By: Marnee SpringJonathon  Watts M.D.   On: 07/16/2017 08:06    Anti-infectives: Anti-infectives (From admission, onward)   Start     Dose/Rate Route Frequency Ordered Stop   07/16/17 1030  ceFEPIme (MAXIPIME) 2 g in dextrose 5 % 50 mL IVPB     2 g 100 mL/hr over 30 Minutes Intravenous Every 24 hours 07/16/17 [id number]         Assessment/Plan Atrial fibrillation not on anticoagulation Hypothyroidism -synthroid 56mcg IV qd HTN-BP low 99/60, hold home meds and continue IVF  Dementia-IV valproate, buspar Code status DNR/DNI  Unwitnessed fall Sternal fx- with displacement, CTwithout hematoma or pneumothorax. Continue pain control and pulmonary toilet L knee laceration- xray negative for fx, repaired in ED 12/18. Dry dressing daily and PRN Hyponatremia - Na up to 134, continue IVF 0.9% NaCl at 7950mL/hr Hypokalemia - K 3.3, continue oral potassium replacement  ID - maxipime 12/20>>day#2. Blood and sputum cultures pending VTE -SCDs, heparin FEN -IVF at 7250mL/hr, FLD (nectar thick) Foley -none  Plan -Continue IVF and maxipime. Ok to try to wean supplemental oxygen and keep O2 sats >92%. Labs in AM.    LOS: 2 days    Franne FortsBrooke A Meuth , [facility] L[facility] 07/17/2017, 7:25 AM Pager: [phone] Consults: [phone] Mon-Fri 7:00 am-4:30 pm Sat-Sun 7:00 am-11:30 am

## 2017-07-17 NOTE — Consult Note (Signed)
Consultation Note Date: 07/17/2017   Patient Name: [name]  DOB: 1926-04-22  MRN: [id number]  Age / Sex: 83 y.o., female  PCP: Wenda Low, MD Referring Physician: Md, Trauma, MD  Reason for Consultation: Establishing goals of care  HPI/Patient Profile: 83 y.o. female  with past medical history of advanced dementia, HTN, atrial fibrillation, hypothyroidism, diverticulitis, s/p colostomy admitted on 07/14/2017 with unwitnessed fall resulting in left knee laceration and sternal fracture. Palliative care consulted for [place] given poor prognosis.   Clinical Assessment and Goals of Care: I met briefly with Ms. Mccollister but she is deaf and confused. Very sleepy as well and when awake she is agitated with mittens specifically. She does not appear to be in any distress or pain. Per RN patient is agitated mostly when awake but is very sleepy/lethargic. I called and spoke with her HCPOA and cousin, Butch Penny. Butch Penny confirms to me that Ms. Mizuno's previous HCPOA Deetta Perla desired NOT to be HCPOA ~3 years ago and transferred [place] to [place]. Butch Penny says that she has been reviewing paperwork and confirms that Ms. Celestine would not desire aggressive care. She shares that Ms. Laguardia has had very poor QOL since colostomy placed.   Butch Penny last visited 07/01/17 with Ms. Kehm and forced her to join [facility] patty at [facility] and said this is the first time she has seen her smile in ~3 years. She was able to feed herself but required assistance with all other ADLs and was bedbound. Butch Penny shared that Ms. Ackley has told her "you are praying for the wrong thing, you are praying for me to live when you should be praying for me to die." Butch Penny feels that Ms. Gretzinger is certainly prepared to die and accepting of this. Butch Penny supports goal of comfort for Ms. Tiu. We discussed a plan for possible transition to SNF with hospice in place for EOL  care if possible. Butch Penny understands that Ms. Hollibaugh is very frail and could also pass at anytime.   Primary Decision Maker HCPOA cousin Butch Penny    SUMMARY OF RECOMMENDATIONS   - Focus is on comfort care - Does not desire artificial nutrition/hydration - Transition to hospice care at SNF if possible  Code Status/Advance Care Planning:  DNR   Symptom Management:   Agitation: Haldol IV 2 mg every 6 hours prn. Avoid ativan as past palliative notes poor reaction to ativan.   Pain: agitation may be r/t pain with sternal fracture. Morphine 5 mg SL or 1 mg IV every 2 hours prn.   Palliative Prophylaxis:   Aspiration, Delirium Protocol, Frequent Pain Assessment, Oral Care and Turn Reposition  Additional Recommendations (Limitations, Scope, Preferences):  Full Comfort Care  Psycho-social/Spiritual:   Desire for further Chaplaincy support:no  Additional Recommendations: Caregiving  Support/Resources and Education on Hospice  Prognosis:   Days likely  Discharge Planning: [place] with Hospice vs hospital death     Primary Diagnoses: Present on Admission: . Sternal fracture   I have reviewed the medical record, interviewed the patient  and family, and examined the patient. The following aspects are pertinent.  Past Medical History:  Diagnosis Date  . Arthritis   . Diverticulitis 07/01/2011  . Diverticulosis   . Diverticulosis of sigmoid colon 05/09/2014  . DJD (degenerative joint disease)   . Fibromyalgia   . Hypothyroidism   . Osteoarthritis    Social History   Socioeconomic History  . Marital status: Widowed    Spouse name: None  . Number of children: None  . Years of education: None  . Highest education level: None  Social Needs  . Financial resource strain: None  . Food insecurity - worry: None  . Food insecurity - inability: None  . Transportation needs - medical: None  . Transportation needs - non-medical: None  Occupational History  .  None  Tobacco Use  . Smoking status: Former Smoker    Types: Cigarettes    Last attempt to quit: 06/30/1984    Years since quitting: 33.0  . Smokeless tobacco: Never Used  Substance and Sexual Activity  . Alcohol use: Yes  . Drug use: No  . Sexual activity: No  Other Topics Concern  . None  Social History Narrative  . None   Family History  Problem Relation Age of Onset  . Prostate cancer Unknown   . Heart attack Neg Hx   . Stroke Neg Hx    Scheduled Meds: . acetaminophen  1,000 mg Oral Once  . busPIRone  15 mg Oral BID  . chlorhexidine  15 mL Mouth Rinse BID  . cholecalciferol  1,000 Units Oral Daily  . docusate sodium  100 mg Oral BID  . fluticasone  2 spray Each Nare Daily  . heparin  5,000 Units Subcutaneous Q8H  . hydrALAZINE  2 mg Intravenous BID  . levothyroxine  56 mcg Intravenous QAC breakfast  . magnesium oxide  400 mg Oral Daily  . mouth rinse  15 mL Mouth Rinse BID  . oxybutynin  5 mg Oral QHS  . pantoprazole  40 mg Oral Daily   Or  . pantoprazole (PROTONIX) IV  40 mg Intravenous Daily  . potassium chloride  20 mEq Oral BID  . sertraline  25 mg Oral Daily  . simethicone  80 mg Oral BID  . spironolactone  12.5 mg Oral Daily   Continuous Infusions: . ceFEPime (MAXIPIME) IV Stopped (07/17/17 1221)  . methocarbamol (ROBAXIN)  IV    . sodium chloride 0.9 % 1,000 mL infusion 50 mL/hr at 07/16/17 0504  . valproate sodium Stopped (07/17/17 1035)   PRN Meds:.acetaminophen, methocarbamol (ROBAXIN)  IV, metoprolol tartrate, ondansetron **OR** ondansetron (ZOFRAN) IV, RESOURCE THICKENUP CLEAR, traMADol Allergies  Allergen Reactions  . Antihistamines, Chlorpheniramine-Type Other (See Comments)    headache  . Codeine     headache   Review of Systems  Unable to perform ROS: Dementia    Physical Exam  Constitutional: She appears well-developed. She appears lethargic.  HENT:  Head: Normocephalic and atraumatic.  Cardiovascular: Normal rate and regular  rhythm.  Pulmonary/Chest: No accessory muscle usage. No tachypnea. No respiratory distress. She has decreased breath sounds. She has rhonchi.  Abdominal: Soft. Normal appearance.  Neurological: She appears lethargic. She is disoriented.  Deaf, communicate by writing and head nod  Nursing note and vitals reviewed.   Vital Signs: BP 100/81   Pulse 94   Temp (!) 97.3 F (36.3 C) (Axillary)   Resp (!) 23   Ht 5' 2.01" (1.575 m)   Wt 57.9 kg (127 lb  10.3 oz)   SpO2 96%   BMI 23.34 kg/m  Pain Assessment: PAINAD   Pain Score: Asleep   SpO2: SpO2: 96 % O2 Device:SpO2: 96 % O2 Flow Rate: .O2 Flow Rate (L/min): 15 L/min  IO: Intake/output summary:   Intake/Output Summary (Last 24 hours) at 07/17/2017 1501 Last data filed at 07/17/2017 1300 Gross per 24 hour  Intake 1595 ml  Output 600 ml  Net 995 ml    LBM: Last BM Date: 07/15/17 Baseline Weight: Weight: 57.9 kg (127 lb 10.3 oz) Most recent weight: Weight: 57.9 kg (127 lb 10.3 oz)     Palliative Assessment/Data: 20%     Time Total: 50 min  Greater than 50%  of this time was spent counseling and coordinating care related to the above assessment and plan.  Signed by: Vinie Sill, NP Palliative Medicine Team Pager # [phone] (M-F 8a-5p) Team Phone # [phone] (Nights/Weekends)

## 2017-07-17 NOTE — Progress Notes (Signed)
Central telemetry called at approx 1440 to advise me that patient's HR was elevated >130. I assessed patient, and she was calm. HR remained elevated, so I paged MattelBrooke Meuth, [place]. Brooke came to assess patient and stated that patient's POA, [name] did not want to escalate care. I paged Yong ChannelAlicia Parker, NP and called the Palliative team. Awaiting a call back from MathesonAlicia.

## 2017-07-17 NOTE — Progress Notes (Signed)
Several attempts were made to give patient her medications (crushed in various foods). Patient refuses to eat, swatting, pushing away and not opening her mouth.

## 2017-07-17 NOTE — Progress Notes (Signed)
Patient ID: [name], female   DOB: 02/29/1926, 83 y.o.   MRN: [id number]  Received a call that patient was sustaining HR>140s. Went to see patient and she was in atrial fibrillation. She has a h/o a fib but has been in NSR since admission. Patient does not appear in any acute distress. No pain. Called patient's HCPOA to clarify that [name] does not wish to have any further intervention. Will not call cardiology or start any medication.  Appreciate palliative consult.  Joannie Medine A Sol Englert

## 2017-07-17 NOTE — Progress Notes (Signed)
Palliative:  Spoke with Lurena Joinerebecca, RN about tachycardia/atrial fibrillation. Adding prn medication for comfort - recommended to treat pain and anxiety. Minimized medication as she is refusing anyway. Will follow up tomorrow.   Yong ChannelAlicia Haru Anspaugh, NP Palliative Medicine Team Pager # [phone] (M-F 8a-5p) Team Phone # [phone] (Nights/Weekends)

## 2017-07-17 NOTE — Progress Notes (Signed)
Palliative Medicine RN Note: Rec'd a call from pt's RN Wells Guiles who was trying to get [name] (she met with the family earlier). Patient is having HR >150; attending is electing to not address due to not wanting to escalate care. I notified [name] of concerns, and I also explained to Wells Guiles that she is in a family meeting and will not be available immediately; PMT normally defers questions like this to primary. [name] will call the nurse when she is available and can check her pager; urgent concerns need to be called in to the attending service.    Marjie Skiff Mase Dhondt, RN, BSN, [facility] 07/17/2017 4:52 PM Office [phone]

## 2017-07-17 NOTE — Plan of Care (Signed)
Pt has not had any issues with safety during the shift. Pt has slept most of the shift.  Pt has not had much nutrition today. Pt had 4 bites of vanilla pudding with her HS medications before she began swatting at [employer] Electricwriter refusing anymore pudding

## 2017-07-17 NOTE — Clinical Social Work Note (Addendum)
Clinical Social Worker continuing to follow patient and family for support and possible discharge planning needs.  Palliative Care consult has been placed to define patient goals of care with [facility].  CSW to follow up once that is completed to determine best discharge disposition.  15:57 CSW spoke with patient HCPOA who states that she has met with Palliative and is hopeful that patient will become medically stable for return to [facility] with Hospice following.  CSW to continue to follow patient regarding medical stability for discharge.  [name], [name]

## 2017-07-18 LAB — BASIC METABOLIC PANEL
ANION GAP: 8 (ref 5–15)
BUN: 25 mg/dL — ABNORMAL HIGH (ref 6–20)
CO2: 25 mmol/L (ref 22–32)
CREATININE: 0.6 mg/dL (ref 0.44–1.00)
Calcium: 8.9 mg/dL (ref 8.9–10.3)
Chloride: 103 mmol/L (ref 101–111)
GFR calc non Af Amer: >60 mL/min (ref >60)
GLUCOSE: 88 mg/dL (ref 65–99)
POTASSIUM: 3.4 mmol/L — AB (ref 3.5–5.1)
Sodium: 136 mmol/L (ref 135–145)

## 2017-07-18 LAB — CBC
HEMATOCRIT: 36.1 % (ref 36.0–46.0)
HEMOGLOBIN: 11.8 g/dL — AB (ref 12.0–15.0)
MCH: 28.9 pg (ref 26.0–34.0)
MCHC: 32.7 g/dL (ref 30.0–36.0)
MCV: 88.5 fL (ref 78.0–100.0)
Platelets: 348 10*3/uL (ref 150–400)
RBC: 4.08 MIL/uL (ref 3.87–5.11)
RDW: 15.2 % (ref 11.5–15.5)
WBC: 16.7 10*3/uL — AB (ref 4.0–10.5)

## 2017-07-18 MED ORDER — POTASSIUM CHLORIDE CRYS ER 20 MEQ PO TBCR
40.0000 meq | EXTENDED_RELEASE_TABLET | Freq: Once | ORAL | Status: DC
  Filled 2017-07-18: qty 2

## 2017-07-18 NOTE — Progress Notes (Signed)
Gave meds whole with applesauce one at a time. Patient spat out meds as well as applesauce. Marked morning meds as patient refused.

## 2017-07-18 NOTE — Progress Notes (Signed)
   Subjective/Chief Complaint: Pt stable overnight.   Objective: Vital signs in last 24 hours: Temp:  [97.3 F (36.3 C)-98.4 F (36.9 C)] 98.2 F (36.8 C) (12/22 0300) Pulse Rate:  [82-174] 87 (12/22 0000) Resp:  [17-27] 17 (12/22 0000) BP: (86-133)/(46-110) 86/46 (12/22 0300) SpO2:  [88 %-96 %] 96 % (12/22 0000) Last BM Date: (UTA)  Intake/Output from previous day: 12/21 0701 - 12/22 0700 In: 287.2 [P.O.:180; I.V.:54.7; IV Piggyback:52.5] Out: 150 [Urine:150] Intake/Output this shift: No intake/output data recorded.  Constitutional: No acute distress, conversant, appears states age. Eyes: Anicteric sclerae, moist conjunctiva, no lid lag Lungs: Coarse BS bilaterally, normal respiratory effort, on BNC CV: regular rate, no murmurs, no peripheral edema, pedal pulses 2+ GI: Soft, no masses or hepatosplenomegaly, non-tender to palpation Skin: No rashes, palpation reveals normal turgor Psychiatric: dementia   Lab Results:  Recent Labs    07/17/17 0356 07/18/17 0450  WBC 20.9* 16.7*  HGB 12.5 11.8*  HCT 38.5 36.1  PLT 347 348   BMET Recent Labs    07/17/17 0356 07/18/17 0450  NA 134* 136  K 3.3* 3.4*  CL 101 103  CO2 26 25  GLUCOSE 103* 88  BUN 16 25*  CREATININE 0.56 0.60  CALCIUM 8.5* 8.9  Studies/Results: Dg Chest Port 1 View  Result Date: 07/16/2017 CLINICAL DATA:  Shortness of breath EXAM: PORTABLE CHEST 1 VIEW COMPARISON:  Two days ago FINDINGS: Limited due to kyphotic deformity. Atelectasis or pneumonia at the bases with small left effusion. Cardiomegaly. No generalized Kerley lines. Hiatal hernia. IMPRESSION: 1. Atelectasis or pneumonia at the bases that is progressed from 2 days prior. 2. Large hiatal hernia. Electronically Signed   By: Marnee SpringJonathon  Watts M.D.   On: 07/16/2017 08:06    Anti-infectives: Anti-infectives (From admission, onward)   Start     Dose/Rate Route Frequency Ordered Stop   07/16/17 1030  ceFEPIme (MAXIPIME) 2 g in dextrose 5 %  50 mL IVPB     2 g 100 mL/hr over 30 Minutes Intravenous Every 24 hours 07/16/17 [id number]        Assessment/Plan: Atrial fibrillation not on anticoagulation Hypothyroidism -synthroid 56mcg IV qd HTN-BPlow 99/60, hold home meds and continue IVF Dementia-IV valproate,buspar Code status DNR/DNI  Unwitnessed fall Sternal fx- with displacement, CTwithout hematoma or pneumothorax. Continue pain control andpulmonary toilet L knee laceration- xray negative for fx, repaired in ED 12/18. Dry dressing daily and PRN Hyponatremia - Na up to 136, continue IVF 0.9% NaCl at 5650mL/hr Hypokalemia - K 3.3, continue oral potassium replacement  ID - maxipime 12/20>>day#3. Blood and sputum cultures pending VTE -SCDs, heparin FEN -IVFat 4250mL/hr, FLD (nectar thick) Foley -none  Plan -Continue IVF and maxipime. Ok to try to wean supplemental oxygen and keep O2 sats >92%. Labs in AM.     LOS: 3 days    Marigene Ehlersamirez Jr., [facility] 07/18/2017

## 2017-07-18 NOTE — Progress Notes (Signed)
Took patient off telemetry per NP order.

## 2017-07-18 NOTE — Progress Notes (Signed)
Daily Progress Note   Patient Name: [name]       Date: 07/18/2017 DOB: January 08, 1926  Age: 83 y.o. MRN#: [id number] Attending Physician: Roslynn AmbleMd, Trauma, MD Primary Care Physician: Georgann HousekeeperHusain, Karrar, MD Admit Date: 07/14/2017  Reason for Consultation/Follow-up: Establishing goals of care  Subjective: [name] continues to be agitated. Pulling off oxygen even with mittens on.   Length of Stay: 3  Current Medications: Scheduled Meds:  . acetaminophen  1,000 mg Oral Once  . busPIRone  15 mg Oral BID  . chlorhexidine  15 mL Mouth Rinse BID  . docusate sodium  100 mg Oral BID  . fluticasone  2 spray Each Nare Daily  . hydrALAZINE  2 mg Intravenous BID  . levothyroxine  56 mcg Intravenous QAC breakfast  . mouth rinse  15 mL Mouth Rinse BID  . pantoprazole  40 mg Oral Daily   Or  . pantoprazole (PROTONIX) IV  40 mg Intravenous Daily  . potassium chloride  40 mEq Oral Once  . sertraline  25 mg Oral Daily    Continuous Infusions: . ceFEPime (MAXIPIME) IV 2 g (07/18/17 0915)  . methocarbamol (ROBAXIN)  IV    . sodium chloride 0.9 % 1,000 mL infusion 10 mL/hr at 07/17/17 2232  . valproate sodium Stopped (07/17/17 2332)    PRN Meds: acetaminophen, haloperidol lactate, methocarbamol (ROBAXIN)  IV, metoprolol tartrate, morphine CONCENTRATE **OR** morphine injection, ondansetron **OR** ondansetron (ZOFRAN) IV, RESOURCE THICKENUP CLEAR  Physical Exam         Constitutional: She appears well-developed. She appears lethargic.  HENT:  Head: Normocephalic and atraumatic.  Cardiovascular: Normal rate and regular rhythm.  Pulmonary/Chest: No accessory muscle usage. No tachypnea. No respiratory distress. She has decreased breath sounds. She has rhonchi.  Abdominal: Soft. Normal appearance.    Neurological: She appears lethargic. She is disoriented.  Deaf, communicate by writing and head nod  Nursing note and vitals reviewed.   Vital Signs: BP 99/87   Pulse 86   Temp 98.2 F (36.8 C) (Axillary)   Resp 18   Ht 5' 2.01" (1.575 m)   Wt 57.9 kg (127 lb 10.3 oz)   SpO2 90%   BMI 23.34 kg/m  SpO2: SpO2: 90 % O2 Device: O2 Device: High Flow Nasal Cannula O2 Flow Rate: O2 Flow Rate (L/min):  10 L/min  Intake/output summary:   Intake/Output Summary (Last 24 hours) at 07/18/2017 0953 Last data filed at 07/18/2017 0600 Gross per 24 hour  Intake 247.17 ml  Output 150 ml  Net 97.17 ml   LBM: Last BM Date: (UTA) Baseline Weight: Weight: 57.9 kg (127 lb 10.3 oz) Most recent weight: Weight: 57.9 kg (127 lb 10.3 oz)       Palliative Assessment/Data: 20%     Patient Active Problem List   Diagnosis Date Noted  . Goals of care, counseling/discussion   . Palliative care encounter   . Pressure injury of skin 07/15/2017  . Sternal fracture 07/14/2017  . Hypomagnesemia 05/19/2014  . Klebsiella cystitis 05/19/2014  . Malnutrition of moderate degree (HCC) 05/18/2014  . Atrial fibrillation with RVR (HCC) 05/17/2014  . Shock circulatory (HCC) 05/14/2014  . HOH (hard of hearing) 05/10/2014  . Dementia 05/10/2014  . 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 Diverticulosis of sigmoid colon 05/09/2014  . Fibromyalgia   . History of GI diverticular bleed 09/24/2012  . Anemia 09/22/2012  . Shortness of breath 09/22/2012  . Normocytic anemia 07/02/2011  . Hypokalemia 07/02/2011  . Hypothyroidism 07/01/2011  . Hearing loss 07/01/2011    Palliative Care Assessment & Plan   HPI: 83 y.o. female  with past medical history of advanced dementia, HTN, atrial fibrillation, hypothyroidism, diverticulitis, s/p colostomy admitted on 07/14/2017 with unwitnessed fall resulting in left knee laceration and sternal fracture.  Palliative care consulted for GOC given poor prognosis. Goal is for comfort.   Assessment: I attempted to reach HCPOA that we have documented in our electronic record, [name] [phone], x 5 unsuccessfully. Unable to leave voicemail. Called [facility] which did not have Ms. Munoz as a contact at all. Unable to find any further contact with [name] but confirm they have [name] listed as POA in their records. Requested nursing to obtain contact info if [name] calls.   I did speak further with [name] who confirms desire for comfort. We discussed plan to liberate [name] from tele and monitoring, IVF, and allow to d/c oxygen for comfort. [name] continues to agree with transition to hospice at [facility] (or even to hospice facility) if stable enough to do so. Oxygen sats in 70s so unsure this will be possible. Goal is for comfort.   Recommendations/Plan:  Agitation: Haldol IV 2 mg every 6 hours prn. Avoid ativan as past palliative notes poor reaction to ativan.   Pain: agitation may be r/t pain with sternal fracture. Morphine 5 mg SL or 1 mg IV every 2 hours prn.   Consider scheduled pain medication which may help agitation.   Attempt to keep more comfortable with medications and to d/c mittens for comfort.    Goals of Care and Additional Recommendations:  Limitations on Scope of Treatment: Full Comfort Care  Code Status:  DNR  Prognosis:   Hours - Days  Discharge Planning:  SNF with hospice or hospice facility vs hospital death  Discussed plan with Dr. Derrell Lollingamirez.   Thank you for allowing the Palliative Medicine Team to assist in the care of this patient.   Total Time 35 min Prolonged Time Billed  no       Greater than 50%  of this time was spent counseling and coordinating care related to the above assessment and plan.  Yong ChannelAlicia Iman Reinertsen, NP Palliative Medicine Team Pager # [phone] (M-F 8a-5p) Team Phone # [phone] (Nights/Weekends)

## 2017-07-19 DIAGNOSIS — S81812A Laceration without foreign body, left lower leg, initial encounter: Secondary | ICD-10-CM

## 2017-07-19 DIAGNOSIS — G3183 Dementia with Lewy bodies: Secondary | ICD-10-CM

## 2017-07-19 DIAGNOSIS — Z515 Encounter for palliative care: Secondary | ICD-10-CM

## 2017-07-19 DIAGNOSIS — W19XXXA Unspecified fall, initial encounter: Secondary | ICD-10-CM

## 2017-07-19 DIAGNOSIS — F0280 Dementia in other diseases classified elsewhere without behavioral disturbance: Secondary | ICD-10-CM

## 2017-07-19 LAB — BASIC METABOLIC PANEL
Anion gap: 8 (ref 5–15)
BUN: 29 mg/dL — ABNORMAL HIGH (ref 6–20)
CALCIUM: 9.1 mg/dL (ref 8.9–10.3)
CHLORIDE: 102 mmol/L (ref 101–111)
CO2: 27 mmol/L (ref 22–32)
CREATININE: 0.57 mg/dL (ref 0.44–1.00)
GFR calc non Af Amer: >60 mL/min (ref >60)
GLUCOSE: 87 mg/dL (ref 65–99)
Potassium: 3.2 mmol/L — ABNORMAL LOW (ref 3.5–5.1)
Sodium: 137 mmol/L (ref 135–145)

## 2017-07-19 LAB — CBC
HCT: 41.2 % (ref 36.0–46.0)
HEMOGLOBIN: 13.2 g/dL (ref 12.0–15.0)
MCH: 28.7 pg (ref 26.0–34.0)
MCHC: 32 g/dL (ref 30.0–36.0)
MCV: 89.6 fL (ref 78.0–100.0)
PLATELETS: 354 10*3/uL (ref 150–400)
RBC: 4.6 MIL/uL (ref 3.87–5.11)
RDW: 15.8 % — ABNORMAL HIGH (ref 11.5–15.5)
WBC: 17.5 10*3/uL — ABNORMAL HIGH (ref 4.0–10.5)

## 2017-07-19 MED ORDER — SCOPOLAMINE 1 MG/3DAYS TD PT72
1.0000 | MEDICATED_PATCH | TRANSDERMAL | Status: DC
  Administered 2017-07-19: 1.5 mg via TRANSDERMAL
  Filled 2017-07-19: qty 1

## 2017-07-19 MED ORDER — HALOPERIDOL LACTATE 2 MG/ML PO CONC
2.0000 mg | Freq: Four times a day (QID) | ORAL | Status: DC | PRN
  Filled 2017-07-19: qty 1

## 2017-07-19 MED ORDER — GLYCOPYRROLATE 0.2 MG/ML IJ SOLN: 0.2000 mg | INTRAMUSCULAR | Status: DC | PRN

## 2017-07-19 NOTE — Progress Notes (Signed)
[facility] Progress Note      Assessment/Plan Atrial fibrillation not on anticoagulation Hypothyroidism -synthroid 56mcg IV qd HTN-BPlow99/60, hold home medsand continue IVF Dementia-IV valproate,buspar Code status DNR/DNI  Unwitnessed fall Sternal fx- with displacement, CTwithout hematoma or pneumothorax. Continue pain control andpulmonary toilet L knee laceration- xray negative for fx, repaired in ED 12/18. Dry dressing daily andPRN Hyponatremia - Na up to 136, continue IVF 0.9% NaCl at 1210mL/hr Hypokalemia - K 3.3,continue oral potassium replacement  ID -maxipime 12/20>> Blood and sputum cultures pending VTE -SCDs, heparin FEN -IVFat 8610mL/hr, FLD (nectar thick) Foley -none  Plan -Comfort care, supplemental O2 stopped. IVF and maxipime have been continued but abx should be stopped. Palliative care involved and appreciate their assistance    LOS: 4 days    Subjective:  CC: wants water  Pt is asking me to play golf with her. She has dementia at baseline.   Objective: Vital signs in last 24 hours: Temp:  [97.1 F (36.2 C)-98 F (36.7 C)] 97.8 F (36.6 C) (12/23 0517) Pulse Rate:  [74-98] 74 (12/23 0517) Resp:  [16-18] 16 (12/23 0517) BP: (98-116)/(58-74) 111/74 (12/23 0517) SpO2:  [81 %-90 %] 90 % (12/23 0517) Last BM Date: (UTA-pt confused)  Intake/Output from previous day: 12/22 0701 - 12/23 0700 In: 362.5 [P.O.:120; I.V.:90; IV Piggyback:152.5] Out: 50 [Stool:50] Intake/Output this shift: Total I/O In: 12 [P.O.:12] Out: -   PE: Gen:  Alert, NAD, appears stated age HEENT: pupils equal and round, anicteric sclerae Card:  RRR, no M/G/R heard Pulm:  Course breath sounds b/l, normal effort, no [place] Abd: Soft, ND, +BS Skin: no rashes noted, warm and dry   Anti-infectives: Anti-infectives (From admission, onward)   Start     Dose/Rate Route Frequency Ordered Stop   07/16/17 1030  ceFEPIme (MAXIPIME) 2 g in  dextrose 5 % 50 mL IVPB     2 g 100 mL/hr over 30 Minutes Intravenous Every 24 hours 07/16/17 0938        Lab Results:  Recent Labs    07/18/17 0450 07/19/17 0630  WBC 16.7* 17.5*  HGB 11.8* 13.2  HCT 36.1 41.2  PLT 348 354   BMET Recent Labs    07/18/17 0450 07/19/17 0630  NA 136 137  K 3.4* 3.2*  CL 103 102  CO2 25 27  GLUCOSE 88 87  BUN 25* 29*  CREATININE 0.60 0.57  CALCIUM 8.9 9.1   PT/INR No results for input(s): LABPROT, INR in the last 72 hours. CMP     Component Value Date/Time   NA 137 07/19/2017 0630   K 3.2 (L) 07/19/2017 0630   CL 102 07/19/2017 0630   CO2 27 07/19/2017 0630   GLUCOSE 87 07/19/2017 0630   BUN 29 (H) 07/19/2017 0630   CREATININE 0.57 07/19/2017 0630   CALCIUM 9.1 07/19/2017 0630   PROT 6.4 05/09/2014 0510   ALBUMIN 3.3 (L) 05/09/2014 0510   AST 27 05/09/2014 0510   ALT 17 05/09/2014 0510   ALKPHOS 58 05/09/2014 0510   BILITOT 0.7 05/09/2014 0510   GFRNONAA >60 07/19/2017 0630   GFRAA >60 07/19/2017 0630   Lipase     Component Value Date/Time   LIPASE 60 (H) 05/08/2014 1608    Studies/Results: No results found.    Jerre SimonJessica L Utah Delauder , [facility] 07/19/2017, 9:05 AM Pager: [phone] Consults: [phone] Mon-Fri 7:00 am-4:30 pm Sat-Sun 7:00 am-11:30 am

## 2017-07-19 NOTE — Progress Notes (Signed)
Daily Progress Note   Patient Name: [name]       Date: 07/19/2017 DOB: Mar 04, 1926  Age: 83 y.o. MRN#: [id number] Attending Physician: Roslynn AmbleMd, Trauma, MD Primary Care Physician: Georgann HousekeeperHusain, Karrar, MD Admit Date: 07/14/2017  Reason for Consultation/Follow-up: Establishing goals of care  Subjective: Ms. Pershing CoxCerwin wakes to voice. Speaks to me but with baseline dementia. Will not answer questions. Patient accepted a few bites of pudding and yogurt for me. Mitts still on. Not requiring frequent prns.  Length of Stay: 4  Current Medications: Scheduled Meds:  . acetaminophen  1,000 mg Oral Once  . busPIRone  15 mg Oral BID  . chlorhexidine  15 mL Mouth Rinse BID  . docusate sodium  100 mg Oral BID  . fluticasone  2 spray Each Nare Daily  . levothyroxine  56 mcg Intravenous QAC breakfast  . mouth rinse  15 mL Mouth Rinse BID  . pantoprazole  40 mg Oral Daily   Or  . pantoprazole (PROTONIX) IV  40 mg Intravenous Daily  . potassium chloride  40 mEq Oral Once  . scopolamine  1 patch Transdermal Q72H  . sertraline  25 mg Oral Daily    Continuous Infusions: . ceFEPime (MAXIPIME) IV Stopped (07/18/17 1036)  . methocarbamol (ROBAXIN)  IV    . sodium chloride 0.9 % 1,000 mL infusion 10 mL/hr at 07/17/17 2232  . valproate sodium Stopped (07/18/17 2335)    PRN Meds: acetaminophen, haloperidol lactate, methocarbamol (ROBAXIN)  IV, metoprolol tartrate, morphine CONCENTRATE **OR** morphine injection, ondansetron **OR** ondansetron (ZOFRAN) IV, RESOURCE THICKENUP CLEAR  Physical Exam  HENT:  Head: Normocephalic and atraumatic.  Cardiovascular: Regular rhythm.  Pulmonary/Chest: No accessory muscle usage. No tachypnea. No respiratory distress. She has rhonchi.  Room air. Audible secretions    Abdominal: There is no tenderness.  colostomy  Neurological: She is alert. She is disoriented.  Skin: Skin is warm and dry. There is pallor.  Psychiatric: Her speech is slurred. Cognition and memory are impaired. She is inattentive.  Nursing note and vitals reviewed.        Vital Signs: BP 111/74 (BP Location: Left Arm)   Pulse 74   Temp 97.8 F (36.6 C) (Oral)   Resp 16   Ht 5' 2.01" (1.575 m)   Wt 57.9 kg (127 lb 10.3 oz)  SpO2 90%   BMI 23.34 kg/m  SpO2: SpO2: 90 % O2 Device: O2 Device: Not Delivered(nasal cannula was not adequately placed) O2 Flow Rate: O2 Flow Rate (L/min): 3 L/min  Intake/output summary:   Intake/Output Summary (Last 24 hours) at 07/19/2017 1004 Last data filed at 07/19/2017 0854 Gross per 24 hour  Intake 354.5 ml  Output 50 ml  Net 304.5 ml   LBM: Last BM Date: (UTA-pt confused) Baseline Weight: Weight: 57.9 kg (127 lb 10.3 oz) Most recent weight: Weight: 57.9 kg (127 lb 10.3 oz)       Palliative Assessment/Data: 20%     Patient Active Problem List   Diagnosis Date Noted  . Goals of care, counseling/discussion   . Palliative care encounter   . Pressure injury of skin 07/15/2017  . Sternal fracture 07/14/2017  . Hypomagnesemia 05/19/2014  . Klebsiella cystitis 05/19/2014  . Malnutrition of moderate degree (HCC) 05/18/2014  . Atrial fibrillation with RVR (HCC) 05/17/2014  . Shock circulatory (HCC) 05/14/2014  . HOH (hard of hearing) 05/10/2014  . Dementia 05/10/2014  . 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 Diverticulosis of sigmoid colon 05/09/2014  . Fibromyalgia   . History of GI diverticular bleed 09/24/2012  . Anemia 09/22/2012  . Shortness of breath 09/22/2012  . Normocytic anemia 07/02/2011  . Hypokalemia 07/02/2011  . Hypothyroidism 07/01/2011  . Hearing loss 07/01/2011    Palliative Care Assessment & Plan   HPI: 83 y.o. female  with past medical history  of advanced dementia, HTN, atrial fibrillation, hypothyroidism, diverticulitis, s/p colostomy admitted on 07/14/2017 with unwitnessed fall resulting in left knee laceration and sternal fracture. Palliative care consulted for GOC given poor prognosis. Goal is for comfort.   Assessment: Dementia Unwitnessed fall Sternal fracture Left knee laceration  Recommendations/Plan:  DNR/DNI. Comfort focused care.   Symptom management  Morphine 5mg  PO q2h prn pain/dyspnea or Morphine 1mg  IV q2h prn pain/dyspnea.  Haldol 2mg  PO q6h prn agitation or Haldol 2mg  IV q6h prn agitation  Robinul 0.2mg  IV q4h prn secretions  Scopolamine patch  Continue comfort feeds.   She is not requiring frequent prns to maintain comfort. [facility]   Goals of Care and Additional Recommendations:  Comfort focused care.   Code Status:  DNR  Prognosis:   < 2 weeks  Discharge Planning:  SNF with hospice if she remains stable for transfer.   Thank you for allowing the Palliative Medicine Team to assist in the care of this patient.   Total Time 35 min Prolonged Time Billed  no       Greater than 50%  of this time was spent counseling and coordinating care related to the above assessment and plan.  Vennie HomansMegan Ameia Morency, FNP-C Palliative Medicine Team  Phone: [phone] Fax: [phone]

## 2017-07-20 MED ORDER — METHOCARBAMOL 500 MG PO TABS: 500.0000 mg | ORAL_TABLET | Freq: Four times a day (QID) | ORAL | 0 refills | Status: AC

## 2017-07-20 NOTE — Progress Notes (Signed)
Patient ID: [name], female   DOB: [birth year], 83 y.o.   MRN: [id number] [facility] Progress Note:   * No surgery found *  Subjective: Mental status is demented and not responsive Objective: Vital signs in last 24 hours: Temp:  [97.9 F (36.6 C)-99.2 F (37.3 C)] 98.3 F (36.8 C) (12/24 0600) Pulse Rate:  [87-95] 95 (12/24 0600) Resp:  [11-17] 16 (12/24 0600) BP: (102-134)/(67-85) 102/85 (12/24 0600) SpO2:  [76 %-92 %] 91 % (12/24 0600)  Intake/Output from previous day: 12/23 0701 - 12/24 0700 In: 36 [P.O.:36] Out: 400 [Urine:400] Intake/Output this shift: No intake/output data recorded.  Physical Exam: Work of breathing is not labored ;  Ostomy and suprapubic tube noted.    Lab Results:  Results for orders placed or performed during the hospital encounter of 07/14/17 (from the past 48 hour(s))  Basic metabolic panel     Status: Abnormal   Collection Time: 07/19/17  6:30 AM  Result Value Ref Range   Sodium 137 135 - 145 mmol/L   Potassium 3.2 (L) 3.5 - 5.1 mmol/L   Chloride 102 101 - 111 mmol/L   CO2 27 22 - 32 mmol/L   Glucose, Bld 87 65 - 99 mg/dL   BUN 29 (H) 6 - 20 mg/dL   Creatinine, Ser 0.57 0.44 - 1.00 mg/dL   Calcium 9.1 8.9 - 10.3 mg/dL   GFR calc non Af Amer >60 >60 mL/min   GFR calc Af Amer >60 >60 mL/min    Comment: (NOTE) The eGFR has been calculated using the CKD EPI equation. This calculation has not been validated in all clinical situations. eGFR's persistently <60 mL/min signify possible Chronic Kidney Disease.    Anion gap 8 5 - 15  CBC     Status: Abnormal   Collection Time: 07/19/17  6:30 AM  Result Value Ref Range   WBC 17.5 (H) 4.0 - 10.5 K/uL   RBC 4.60 3.87 - 5.11 MIL/uL   Hemoglobin 13.2 12.0 - 15.0 g/dL   HCT 41.2 36.0 - 46.0 %   MCV 89.6 78.0 - 100.0 fL   MCH 28.7 26.0 - 34.0 pg   MCHC 32.0 30.0 - 36.0 g/dL   RDW 15.8 (H) 11.5 - 15.5 %   Platelets 354 150 - 400 K/uL    Radiology/Results: No results  found.  Anti-infectives: Anti-infectives (From admission, onward)   Start     Dose/Rate Route Frequency Ordered Stop   07/16/17 1030  ceFEPIme (MAXIPIME) 2 g in dextrose 5 % 50 mL IVPB     2 g 100 mL/hr over 30 Minutes Intravenous Every 24 hours 07/16/17 1694        Assessment/Plan: Problem List: Patient Active Problem List   Diagnosis Date Noted  . Fall   . Lower leg laceration with complication, left, initial encounter   . Terminal care   . Goals of care, counseling/discussion   . Palliative care encounter   . Pressure injury of skin 07/15/2017  . Sternal fracture 07/14/2017  . Hypomagnesemia 05/19/2014  . Klebsiella cystitis 05/19/2014  . Malnutrition of moderate degree ([place]) 05/18/2014  . Atrial fibrillation with RVR ([place]) 05/17/2014  . Shock circulatory ([place]) 05/14/2014  . HOH (hard of hearing) 05/10/2014  . Dementia 05/10/2014  . 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 Diverticulosis of sigmoid colon 05/09/2014  . Fibromyalgia   . History of GI diverticular bleed 09/24/2012  . Anemia 09/22/2012  . Shortness  of breath 09/22/2012  . Normocytic anemia 07/02/2011  . Hypokalemia 07/02/2011  . Hypothyroidism 07/01/2011  . Hearing loss 07/01/2011    Would suggest back to [facility] when such arrangements can be made.   * No surgery found *    LOS: 5 days   Matt B. Hassell Done, MD, [facility] [phone] beeper [phone]  07/20/2017 9:47 AM

## 2017-07-20 NOTE — Progress Notes (Signed)
Palliative Medicine RN Note: Noted discussion and order for SW to set up hospice at SNF.   Hospice at [facility] orders must be on the d/c orders and summary, and then the facility will set it up. Please ensure this recommendation is included when the patient leaves.  Margret ChanceMelanie G. Dajai Wahlert, RN, BSN, [facility] 07/20/2017 9:11 AM Office [phone]

## 2017-07-20 NOTE — Progress Notes (Signed)
[facility]

## 2017-07-20 NOTE — Care Management Important Message (Signed)
Important Message  Patient Details  Name: [name] MRN: [id number] Date of Birth: 31-Jul-1925   Medicare Important Message Given:  Yes    Dayane Hillenburg [place] 07/20/2017, 9:59 AM

## 2017-07-20 NOTE — Discharge Instructions (Signed)

## 2017-07-20 NOTE — Clinical Social Work Placement (Signed)
   CLINICAL SOCIAL WORK PLACEMENT  NOTE [facility] Date:  07/20/2017  Patient Details  Name: [name] MRN: [id number] Date of Birth: Apr 14, 1926  Clinical Social Work is seeking post-discharge placement for this patient at the Skilled  Nursing Facility level of care (*CSW will initial, date and re-position this form in  chart as items are completed):  Yes   Patient/family provided with [place] Clinical Social Work Department's list of facilities offering this level of care within the geographic area requested by the patient (or if unable, by the patient's family).  Yes   Patient/family informed of their freedom to choose among providers that offer the needed level of care, that participate in Medicare, Medicaid or managed care program needed by the patient, have an available bed and are willing to accept the patient.  Yes   Patient/family informed of [place]'s ownership interest in [facility] and [facility], as well as of the fact that they are under no obligation to receive care at these facilities.  PASRR submitted to EDS on       PASRR number received on       Existing PASRR number confirmed on 07/20/17     FL2 transmitted to all facilities in geographic area requested by pt/family on 07/20/17     FL2 transmitted to all facilities within larger geographic area on       Patient informed that his/her managed care company has contracts with or will negotiate with certain facilities, including the following:        Yes   Patient/family informed of bed offers received.  [facility]     Physician recommends and patient chooses bed at      [facility].  Patient to be transferred to facility by [facility]     Patient family notified on 07/20/17 of transfer.  Name of family member notified:  [name], friend/POA     PHYSICIAN       Additional Comment:     _______________________________________________ Doy HutchingIsabel H Ryland [place], LCSWA 07/20/2017, 12:00 PM

## 2017-07-20 NOTE — Care Management Note (Signed)
Case Management Note  Patient Details  Name: [name] MRN: [id number] Date of Birth: 02-13-1926  Subjective/Objective:   Pt admitted on 07/14/17 s/p fall with sternal fx and knee laceration.  PTA, [facility]; she has significant dementia.                  Action/Plan: Pt is a DNR, per HCPOA.  CSW consulted to facilitate return to SNF upon medical stability.    Expected Discharge Date:  07/20/17               Expected Discharge Plan:  Skilled Nursing Facility  In-House Referral:  Clinical Social Work  Discharge planning Services  CM Consult  Post Acute Care Choice:    Choice offered to:     DME Arranged:    DME Agency:     HH Arranged:    HH Agency:     Status of Service:  Completed, signed off  If discussed at [employer] Meetings, dates discussed:    Additional Comments:  07/20/17 [name], RN, BSN Pt medically stable for discharge today, per MD.  [facility] with Hospice follow up at the facility, per family's wishes.    Quintella BatonJulie W. Zenovia Justman, RN, BSN  Trauma/Neuro ICU Case Manager [phone]

## 2017-07-20 NOTE — Social Work (Signed)
Clinical Social Worker facilitated patient discharge including contacting patient family and facility to confirm patient discharge plans.  Clinical information faxed to facility and family agreeable with plan.  CSW arranged ambulance transport via [facility] to [facility].   RN to call [phone] with report prior to discharge.  Clinical Social Worker will sign off for now as social work intervention is no longer needed. Please consult us again if new need arises.  Caelum Federici H Dayonna Selbe, LCSWA Clinical Social Worker  

## 2017-07-20 NOTE — Discharge Summary (Signed)
Physician Discharge Summary  Patient ID: [name] MRN: [id number] DOB/AGE: 1925/09/28 83 y.o.  Admit date: 07/14/2017 Discharge date: 07/20/2017  Discharge Diagnoses Dementia Unwitnessed fall Sternal fracture Left knee laceration  Consultants Palliative care  Procedures 1. Laceration repair - 07/14/17 Dr. Donnald GarrePfeiffer  HPI: [name] is an 83yo female PMH dementia [facility] where she was found down lying beside her bed. Patient was complaining of chest and left knee pain. She is alert and does not appear in any acute distress. She cannot tell me how she injured herself. History limited due to dementia. She was found to have a left knee laceration, repaired in the ED, and no underlying fracture on xray. She was also found to have a sternal body fracture with displacement, CT scan pending. She does not appear to have any head/neck trauma, and is not complaining of any pain in her back or other 3 extremities. Patient lives in a SNF at baseline.   Hospital Course: Patient was admitted to the trauma service and [facility] trauma service to continue to monitor patient status. SLP evaluated patient 12/19 and found patient to be a moderate aspiration risk, recommended crushed medications and dysphagia diet. Patient noted to have worsening respiratory failure 12/20, repeat CXR showed probable pneumonia and patient was started on antibiotics. Patient's DNI/DNR status clarified with Chyrel MassonHCPOA, [name], 12/20. Palliative care consulted 12/21 and recommended focus on comfort with [facility]. Sutures to be removed from left knee 12/24.   On 07/20/17 patient will be discharged back to SNF with transition to hospice care.    Allergies as of 07/20/2017      Reactions   Antihistamines, Chlorpheniramine-type Other (See Comments)   headache   Codeine    headache      Medication List    STOP taking these  medications   traMADol 50 MG tablet Commonly known as:  ULTRAM     TAKE these medications   acetaminophen 325 MG tablet Commonly known as:  TYLENOL Take 650 mg by mouth every 4 (four) hours as needed for mild pain, moderate pain or headache.   busPIRone 15 MG tablet Commonly known as:  BUSPAR Take 15 mg by mouth 2 (two) times daily.   C-500 PO Take 1 tablet by mouth 2 (two) times daily.   cholecalciferol 1000 units tablet Commonly known as:  VITAMIN D Take 1,000 Units by mouth daily.   divalproex 125 MG capsule Commonly known as:  DEPAKOTE SPRINKLE Take 250 mg by mouth 2 (two) times daily.   feeding supplement Liqd Take 1 Container by mouth 2 (two) times daily between meals.   fluticasone 50 MCG/ACT nasal spray Commonly known as:  FLONASE Place 2 sprays into both nostrils daily.   hydrALAZINE 10 MG tablet Commonly known as:  APRESOLINE Take 10 mg by mouth 2 (two) times daily.   levothyroxine 112 MCG tablet Commonly known as:  SYNTHROID, LEVOTHROID Take 112 mcg by mouth daily before breakfast.   magic mouthwash Soln Take 15 mLs by mouth 4 (four) times daily as needed for mouth pain (sore throat).   magnesium oxide 400 MG tablet Commonly known as:  MAG-OX Take 400 mg by mouth daily.   MAXITROL OP Apply to eye as directed. TO LEFT EYE X 1 WEEK STARTING 06/29/14 PER SNF   methocarbamol 500 MG tablet Commonly known as:  ROBAXIN Take 1 tablet (500 mg total) by mouth  4 (four) times daily.   oxybutynin 5 MG tablet Commonly known as:  DITROPAN Take 5 mg by mouth at bedtime.   potassium chloride SA 20 MEQ tablet Commonly known as:  K-DUR,KLOR-CON Take 3 tablets (60 mEq total) by mouth 2 (two) times daily.   senna 8.6 MG tablet Commonly known as:  SENOKOT Take 1 tablet by mouth at bedtime.   sertraline 25 MG tablet Commonly known as:  ZOLOFT Take 25 mg by mouth daily.   simethicone 80 MG chewable tablet Commonly known as:  MYLICON Chew 80 mg by mouth 2  (two) times daily.   spironolactone 25 MG tablet Commonly known as:  ALDACTONE Take 12.5 mg by mouth daily.        Follow-up Information    [facility] Follow up.   Why:  No follow up needed. Call as needed with questions.  Contact information: [address] [place] [id number] [phone]          Signed: Wells GuilesKelly Rayburn , [facility] 07/20/2017, 11:05 AM Pager: [phone] Trauma: [phone] Mon-Fri 7:00 am-4:30 pm Sat-Sun 7:00 am-11:30 am

## 2017-07-20 NOTE — Social Work (Addendum)
CSW acknowledging consult for pt to return to [facility] with Hospice support.   CSW has sent FL2/referral to [facility] and left a voicemail with admissions to see if SNF would be amenable to pt returning today.   11:45am-  [facility] is able to accept pt back; sent discharge summary. They are aware of hospice request.   11:55am- Spoke with friend [name] [facility](POA) she is aware and amenable to pt returning to [facility].   CSW continuing to follow to support discharge to [facility] with hospice.   Doy HutchingIsabel H Thomos Domine, LCSWA [facility] Clinical Social Work [phone]

## 2017-07-20 NOTE — Progress Notes (Signed)
  Speech Language Pathology Treatment: Dysphagia  Patient Details Name: [name] MRN: [id number] DOB: [birth year] Today's Date: 07/20/2017 Time: [id number] SLP Time Calculation (min) (ACUTE ONLY): 10 min  Assessment / Plan / Recommendation Clinical Impression  Pt easily awakened, keeps eyes closed, awake however significantly decreased awareness of surroundings/situation. Vocal quality wet and audible chest congestion upon arrival. Observed with thin water with straw drips with inability to utilize straw. Cup sips with anterior spill and delayed cough. Pt will probable aspiration. RN states tech using syringe to give nectar thick milk and SLP trialed x 1 with syringe and nectar in right buccal cavity with pt acceptance and swallow initiation. Recommend using syringe only if pt alert, aware and able to orally propel. Per notes, pt being discharged with hospice. Continue full liquids thickened to nectar per MD. If family prefers true comfort feeds with thin liquids, consider thin water. Doubt pt will have preference to her intake. Will follow up once more.   HPI HPI: Pt is an [age] y/ofemale admitted from SNF s/p fall with a L knee laceration and sternal body fx wtih displacement. CT Chest showed a partial consolidation in the LLL that may reflect PNA or aspiration, possible debris or mucus in the LLL bronchi. PMH includes dementia, osteoarthritis, hypothyroidism, fibromyalgia, DJD, diverticulosis      SLP Plan  Continue with current plan of care       Recommendations  Diet recommendations: Nectar-thick liquid(full liquids per MD) Liquids provided via: Cup;Straw;Teaspoon Medication Administration: Crushed with puree Supervision: Full supervision/cueing for compensatory strategies;Staff to assist with self feeding Compensations: Minimize environmental distractions;Slow rate;Small sips/bites Postural Changes and/or Swallow Maneuvers: Seated upright 90 degrees                Oral Care  Recommendations: Oral care BID Follow up Recommendations: Skilled Nursing facility SLP Visit Diagnosis: Dysphagia, unspecified (R13.10) Plan: Continue with current plan of care       GO                [name], [name] 07/20/2017, 10:41 AM   [name] [name] M.Ed [employer] Pager [phone]

## 2017-07-21 LAB — CULTURE, BLOOD (ROUTINE X 2)
CULTURE: NO GROWTH
Culture: NO GROWTH
SPECIAL REQUESTS: ADEQUATE
SPECIAL REQUESTS: ADEQUATE

## 2017-07-22 DIAGNOSIS — J189 Pneumonia, unspecified organism: Secondary | ICD-10-CM | POA: Diagnosis not present

## 2017-07-22 DIAGNOSIS — J9690 Respiratory failure, unspecified, unspecified whether with hypoxia or hypercapnia: Secondary | ICD-10-CM | POA: Diagnosis not present

## 2017-07-22 DIAGNOSIS — E039 Hypothyroidism, unspecified: Secondary | ICD-10-CM | POA: Diagnosis not present

## 2017-07-22 DIAGNOSIS — L89152 Pressure ulcer of sacral region, stage 2: Secondary | ICD-10-CM | POA: Diagnosis not present

## 2017-07-22 DIAGNOSIS — Z66 Do not resuscitate: Secondary | ICD-10-CM | POA: Diagnosis not present

## 2017-07-22 DIAGNOSIS — F0390 Unspecified dementia without behavioral disturbance: Secondary | ICD-10-CM | POA: Diagnosis not present

## 2017-07-23 DIAGNOSIS — S81812D Laceration without foreign body, left lower leg, subsequent encounter: Secondary | ICD-10-CM | POA: Diagnosis not present

## 2017-07-23 DIAGNOSIS — I480 Paroxysmal atrial fibrillation: Secondary | ICD-10-CM | POA: Diagnosis not present

## 2017-07-23 DIAGNOSIS — S2222XD Fracture of body of sternum, subsequent encounter for fracture with routine healing: Secondary | ICD-10-CM | POA: Diagnosis not present

## 2017-07-23 DIAGNOSIS — J189 Pneumonia, unspecified organism: Secondary | ICD-10-CM | POA: Diagnosis not present

## 2017-07-26 DIAGNOSIS — G301 Alzheimer's disease with late onset: Secondary | ICD-10-CM | POA: Diagnosis not present

## 2017-07-26 DIAGNOSIS — F0634 Mood disorder due to known physiological condition with mixed features: Secondary | ICD-10-CM | POA: Diagnosis not present

## 2017-07-28 DEATH — deceased
